# Patient Record
Sex: Female | Born: 1940
Health system: Southern US, Community
[De-identification: ages and names within clinical notes are randomized; demographics above are authoritative.]

## PROBLEM LIST (undated history)

## (undated) DIAGNOSIS — I10 Essential (primary) hypertension: Secondary | ICD-10-CM

## (undated) DIAGNOSIS — K219 Gastro-esophageal reflux disease without esophagitis: Secondary | ICD-10-CM

## (undated) DIAGNOSIS — R7303 Prediabetes: Secondary | ICD-10-CM

## (undated) DIAGNOSIS — I5032 Chronic diastolic (congestive) heart failure: Secondary | ICD-10-CM

## (undated) DIAGNOSIS — E785 Hyperlipidemia, unspecified: Secondary | ICD-10-CM

## (undated) DIAGNOSIS — I2699 Other pulmonary embolism without acute cor pulmonale: Secondary | ICD-10-CM

## (undated) DIAGNOSIS — I272 Pulmonary hypertension, unspecified: Secondary | ICD-10-CM

## (undated) DIAGNOSIS — U071 COVID-19: Secondary | ICD-10-CM

## (undated) DIAGNOSIS — I4891 Unspecified atrial fibrillation: Secondary | ICD-10-CM

## (undated) DIAGNOSIS — I2510 Atherosclerotic heart disease of native coronary artery without angina pectoris: Secondary | ICD-10-CM

## (undated) HISTORY — DX: Unspecified atrial fibrillation: I48.91

## (undated) HISTORY — PX: ABDOMINAL HYSTERECTOMY: SUR658

## (undated) HISTORY — DX: COVID-19: U07.1

## (undated) HISTORY — DX: Essential (primary) hypertension: I10

## (undated) HISTORY — DX: Gastro-esophageal reflux disease without esophagitis: K21.9

## (undated) HISTORY — DX: Prediabetes: R73.03

## (undated) HISTORY — DX: Other pulmonary embolism without acute cor pulmonale: I26.99

## (undated) HISTORY — DX: Hyperlipidemia, unspecified: E78.5

---

## 2011-08-09 DIAGNOSIS — J019 Acute sinusitis, unspecified: Secondary | ICD-10-CM | POA: Diagnosis not present

## 2011-10-17 DIAGNOSIS — K219 Gastro-esophageal reflux disease without esophagitis: Secondary | ICD-10-CM | POA: Diagnosis not present

## 2011-10-17 DIAGNOSIS — R609 Edema, unspecified: Secondary | ICD-10-CM | POA: Diagnosis not present

## 2011-10-17 DIAGNOSIS — I8310 Varicose veins of unspecified lower extremity with inflammation: Secondary | ICD-10-CM | POA: Diagnosis not present

## 2011-10-29 DIAGNOSIS — N3000 Acute cystitis without hematuria: Secondary | ICD-10-CM | POA: Diagnosis not present

## 2011-10-29 DIAGNOSIS — Z0000 Encounter for general adult medical examination without abnormal findings: Secondary | ICD-10-CM | POA: Diagnosis not present

## 2011-10-29 DIAGNOSIS — Z01419 Encounter for gynecological examination (general) (routine) without abnormal findings: Secondary | ICD-10-CM | POA: Diagnosis not present

## 2011-11-13 DIAGNOSIS — M949 Disorder of cartilage, unspecified: Secondary | ICD-10-CM | POA: Diagnosis not present

## 2011-11-13 DIAGNOSIS — N959 Unspecified menopausal and perimenopausal disorder: Secondary | ICD-10-CM | POA: Diagnosis not present

## 2011-11-13 DIAGNOSIS — Z1231 Encounter for screening mammogram for malignant neoplasm of breast: Secondary | ICD-10-CM | POA: Diagnosis not present

## 2011-11-13 DIAGNOSIS — M899 Disorder of bone, unspecified: Secondary | ICD-10-CM | POA: Diagnosis not present

## 2012-03-24 DIAGNOSIS — Z23 Encounter for immunization: Secondary | ICD-10-CM | POA: Diagnosis not present

## 2012-03-25 DIAGNOSIS — H43399 Other vitreous opacities, unspecified eye: Secondary | ICD-10-CM | POA: Diagnosis not present

## 2012-06-19 DIAGNOSIS — J209 Acute bronchitis, unspecified: Secondary | ICD-10-CM | POA: Diagnosis not present

## 2012-12-21 DIAGNOSIS — Z1231 Encounter for screening mammogram for malignant neoplasm of breast: Secondary | ICD-10-CM | POA: Diagnosis not present

## 2013-01-28 DIAGNOSIS — K219 Gastro-esophageal reflux disease without esophagitis: Secondary | ICD-10-CM | POA: Diagnosis not present

## 2013-01-28 DIAGNOSIS — R42 Dizziness and giddiness: Secondary | ICD-10-CM | POA: Diagnosis not present

## 2013-01-28 DIAGNOSIS — I10 Essential (primary) hypertension: Secondary | ICD-10-CM | POA: Diagnosis not present

## 2013-01-28 DIAGNOSIS — Z79899 Other long term (current) drug therapy: Secondary | ICD-10-CM | POA: Diagnosis not present

## 2013-01-29 DIAGNOSIS — R5383 Other fatigue: Secondary | ICD-10-CM | POA: Diagnosis not present

## 2013-01-29 DIAGNOSIS — R5381 Other malaise: Secondary | ICD-10-CM | POA: Diagnosis not present

## 2013-01-29 DIAGNOSIS — R030 Elevated blood-pressure reading, without diagnosis of hypertension: Secondary | ICD-10-CM | POA: Diagnosis not present

## 2013-02-03 DIAGNOSIS — R209 Unspecified disturbances of skin sensation: Secondary | ICD-10-CM | POA: Diagnosis not present

## 2013-02-03 DIAGNOSIS — K219 Gastro-esophageal reflux disease without esophagitis: Secondary | ICD-10-CM | POA: Diagnosis not present

## 2013-02-03 DIAGNOSIS — I059 Rheumatic mitral valve disease, unspecified: Secondary | ICD-10-CM | POA: Diagnosis not present

## 2013-02-03 DIAGNOSIS — I495 Sick sinus syndrome: Secondary | ICD-10-CM | POA: Diagnosis not present

## 2013-02-03 DIAGNOSIS — R6889 Other general symptoms and signs: Secondary | ICD-10-CM | POA: Diagnosis not present

## 2013-02-03 DIAGNOSIS — Z79899 Other long term (current) drug therapy: Secondary | ICD-10-CM | POA: Diagnosis not present

## 2013-02-03 DIAGNOSIS — G459 Transient cerebral ischemic attack, unspecified: Secondary | ICD-10-CM | POA: Diagnosis not present

## 2013-02-03 DIAGNOSIS — J322 Chronic ethmoidal sinusitis: Secondary | ICD-10-CM | POA: Diagnosis not present

## 2013-02-03 DIAGNOSIS — I10 Essential (primary) hypertension: Secondary | ICD-10-CM | POA: Diagnosis not present

## 2013-02-03 DIAGNOSIS — R5381 Other malaise: Secondary | ICD-10-CM | POA: Diagnosis not present

## 2013-02-03 DIAGNOSIS — G8190 Hemiplegia, unspecified affecting unspecified side: Secondary | ICD-10-CM | POA: Diagnosis not present

## 2013-02-12 DIAGNOSIS — I10 Essential (primary) hypertension: Secondary | ICD-10-CM | POA: Diagnosis not present

## 2013-02-12 DIAGNOSIS — G459 Transient cerebral ischemic attack, unspecified: Secondary | ICD-10-CM | POA: Diagnosis not present

## 2013-07-26 DIAGNOSIS — M169 Osteoarthritis of hip, unspecified: Secondary | ICD-10-CM | POA: Diagnosis not present

## 2013-07-26 DIAGNOSIS — M47817 Spondylosis without myelopathy or radiculopathy, lumbosacral region: Secondary | ICD-10-CM | POA: Diagnosis not present

## 2013-07-26 DIAGNOSIS — M79609 Pain in unspecified limb: Secondary | ICD-10-CM | POA: Diagnosis not present

## 2013-07-26 DIAGNOSIS — M1610 Unilateral primary osteoarthritis, unspecified hip: Secondary | ICD-10-CM | POA: Diagnosis not present

## 2013-07-29 DIAGNOSIS — R609 Edema, unspecified: Secondary | ICD-10-CM | POA: Diagnosis not present

## 2013-07-29 DIAGNOSIS — K219 Gastro-esophageal reflux disease without esophagitis: Secondary | ICD-10-CM | POA: Diagnosis not present

## 2013-07-29 DIAGNOSIS — E559 Vitamin D deficiency, unspecified: Secondary | ICD-10-CM | POA: Diagnosis not present

## 2013-07-29 DIAGNOSIS — E782 Mixed hyperlipidemia: Secondary | ICD-10-CM | POA: Diagnosis not present

## 2013-07-29 DIAGNOSIS — I10 Essential (primary) hypertension: Secondary | ICD-10-CM | POA: Diagnosis not present

## 2013-07-29 DIAGNOSIS — E7800 Pure hypercholesterolemia, unspecified: Secondary | ICD-10-CM | POA: Diagnosis not present

## 2013-09-06 DIAGNOSIS — H6980 Other specified disorders of Eustachian tube, unspecified ear: Secondary | ICD-10-CM | POA: Diagnosis not present

## 2013-09-06 DIAGNOSIS — R42 Dizziness and giddiness: Secondary | ICD-10-CM | POA: Diagnosis not present

## 2013-11-05 DIAGNOSIS — Z86010 Personal history of colonic polyps: Secondary | ICD-10-CM | POA: Diagnosis not present

## 2013-11-05 DIAGNOSIS — D126 Benign neoplasm of colon, unspecified: Secondary | ICD-10-CM | POA: Diagnosis not present

## 2013-11-05 DIAGNOSIS — K5730 Diverticulosis of large intestine without perforation or abscess without bleeding: Secondary | ICD-10-CM | POA: Diagnosis not present

## 2013-11-05 HISTORY — PX: COLONOSCOPY: SHX174

## 2014-02-03 DIAGNOSIS — E782 Mixed hyperlipidemia: Secondary | ICD-10-CM | POA: Diagnosis not present

## 2014-02-03 DIAGNOSIS — E7800 Pure hypercholesterolemia, unspecified: Secondary | ICD-10-CM | POA: Diagnosis not present

## 2014-02-03 DIAGNOSIS — E559 Vitamin D deficiency, unspecified: Secondary | ICD-10-CM | POA: Diagnosis not present

## 2014-02-03 DIAGNOSIS — I10 Essential (primary) hypertension: Secondary | ICD-10-CM | POA: Diagnosis not present

## 2014-02-03 DIAGNOSIS — K219 Gastro-esophageal reflux disease without esophagitis: Secondary | ICD-10-CM | POA: Diagnosis not present

## 2014-03-24 HISTORY — PX: CATARACT EXTRACTION: SUR2

## 2014-03-25 DIAGNOSIS — H25812 Combined forms of age-related cataract, left eye: Secondary | ICD-10-CM | POA: Diagnosis not present

## 2014-04-19 DIAGNOSIS — K219 Gastro-esophageal reflux disease without esophagitis: Secondary | ICD-10-CM | POA: Diagnosis not present

## 2014-04-19 DIAGNOSIS — I10 Essential (primary) hypertension: Secondary | ICD-10-CM | POA: Diagnosis not present

## 2014-04-19 DIAGNOSIS — Z8673 Personal history of transient ischemic attack (TIA), and cerebral infarction without residual deficits: Secondary | ICD-10-CM | POA: Diagnosis not present

## 2014-04-19 DIAGNOSIS — Z79899 Other long term (current) drug therapy: Secondary | ICD-10-CM | POA: Diagnosis not present

## 2014-04-19 DIAGNOSIS — H269 Unspecified cataract: Secondary | ICD-10-CM | POA: Diagnosis not present

## 2014-04-19 DIAGNOSIS — H25812 Combined forms of age-related cataract, left eye: Secondary | ICD-10-CM | POA: Diagnosis not present

## 2014-04-19 DIAGNOSIS — Z7982 Long term (current) use of aspirin: Secondary | ICD-10-CM | POA: Diagnosis not present

## 2014-04-22 DIAGNOSIS — Z23 Encounter for immunization: Secondary | ICD-10-CM | POA: Diagnosis not present

## 2014-05-24 HISTORY — PX: CATARACT EXTRACTION: SUR2

## 2014-05-31 DIAGNOSIS — E785 Hyperlipidemia, unspecified: Secondary | ICD-10-CM | POA: Diagnosis not present

## 2014-05-31 DIAGNOSIS — H269 Unspecified cataract: Secondary | ICD-10-CM | POA: Diagnosis not present

## 2014-05-31 DIAGNOSIS — E669 Obesity, unspecified: Secondary | ICD-10-CM | POA: Diagnosis not present

## 2014-05-31 DIAGNOSIS — K219 Gastro-esophageal reflux disease without esophagitis: Secondary | ICD-10-CM | POA: Diagnosis not present

## 2014-05-31 DIAGNOSIS — Z8673 Personal history of transient ischemic attack (TIA), and cerebral infarction without residual deficits: Secondary | ICD-10-CM | POA: Diagnosis not present

## 2014-05-31 DIAGNOSIS — I10 Essential (primary) hypertension: Secondary | ICD-10-CM | POA: Diagnosis not present

## 2014-05-31 DIAGNOSIS — H25811 Combined forms of age-related cataract, right eye: Secondary | ICD-10-CM | POA: Diagnosis not present

## 2014-05-31 DIAGNOSIS — Z79899 Other long term (current) drug therapy: Secondary | ICD-10-CM | POA: Diagnosis not present

## 2014-05-31 DIAGNOSIS — Z7982 Long term (current) use of aspirin: Secondary | ICD-10-CM | POA: Diagnosis not present

## 2014-06-30 DIAGNOSIS — H524 Presbyopia: Secondary | ICD-10-CM | POA: Diagnosis not present

## 2014-09-05 DIAGNOSIS — E780 Pure hypercholesterolemia: Secondary | ICD-10-CM | POA: Diagnosis not present

## 2014-09-05 DIAGNOSIS — I10 Essential (primary) hypertension: Secondary | ICD-10-CM | POA: Diagnosis not present

## 2014-09-05 DIAGNOSIS — R600 Localized edema: Secondary | ICD-10-CM | POA: Diagnosis not present

## 2014-09-09 DIAGNOSIS — R600 Localized edema: Secondary | ICD-10-CM | POA: Diagnosis not present

## 2014-09-09 DIAGNOSIS — R0600 Dyspnea, unspecified: Secondary | ICD-10-CM | POA: Diagnosis not present

## 2014-11-09 DIAGNOSIS — J0100 Acute maxillary sinusitis, unspecified: Secondary | ICD-10-CM | POA: Diagnosis not present

## 2014-12-15 DIAGNOSIS — K219 Gastro-esophageal reflux disease without esophagitis: Secondary | ICD-10-CM | POA: Diagnosis not present

## 2014-12-15 DIAGNOSIS — I10 Essential (primary) hypertension: Secondary | ICD-10-CM | POA: Diagnosis not present

## 2014-12-15 DIAGNOSIS — E782 Mixed hyperlipidemia: Secondary | ICD-10-CM | POA: Diagnosis not present

## 2014-12-19 DIAGNOSIS — H26493 Other secondary cataract, bilateral: Secondary | ICD-10-CM | POA: Diagnosis not present

## 2014-12-22 DIAGNOSIS — Z1231 Encounter for screening mammogram for malignant neoplasm of breast: Secondary | ICD-10-CM | POA: Diagnosis not present

## 2015-03-24 DIAGNOSIS — E782 Mixed hyperlipidemia: Secondary | ICD-10-CM | POA: Diagnosis not present

## 2015-03-24 DIAGNOSIS — Z23 Encounter for immunization: Secondary | ICD-10-CM | POA: Diagnosis not present

## 2015-03-24 DIAGNOSIS — R7301 Impaired fasting glucose: Secondary | ICD-10-CM | POA: Diagnosis not present

## 2015-03-24 DIAGNOSIS — I10 Essential (primary) hypertension: Secondary | ICD-10-CM | POA: Diagnosis not present

## 2015-03-24 DIAGNOSIS — K219 Gastro-esophageal reflux disease without esophagitis: Secondary | ICD-10-CM | POA: Diagnosis not present

## 2015-06-22 DIAGNOSIS — K219 Gastro-esophageal reflux disease without esophagitis: Secondary | ICD-10-CM | POA: Diagnosis not present

## 2015-06-22 DIAGNOSIS — R21 Rash and other nonspecific skin eruption: Secondary | ICD-10-CM | POA: Diagnosis not present

## 2015-06-22 DIAGNOSIS — R7309 Other abnormal glucose: Secondary | ICD-10-CM | POA: Diagnosis not present

## 2015-06-22 DIAGNOSIS — R7301 Impaired fasting glucose: Secondary | ICD-10-CM | POA: Diagnosis not present

## 2015-06-22 DIAGNOSIS — E782 Mixed hyperlipidemia: Secondary | ICD-10-CM | POA: Diagnosis not present

## 2015-06-22 DIAGNOSIS — I10 Essential (primary) hypertension: Secondary | ICD-10-CM | POA: Diagnosis not present

## 2015-06-29 DIAGNOSIS — H26493 Other secondary cataract, bilateral: Secondary | ICD-10-CM | POA: Diagnosis not present

## 2015-08-31 DIAGNOSIS — K219 Gastro-esophageal reflux disease without esophagitis: Secondary | ICD-10-CM | POA: Diagnosis not present

## 2015-08-31 DIAGNOSIS — E782 Mixed hyperlipidemia: Secondary | ICD-10-CM | POA: Diagnosis not present

## 2015-08-31 DIAGNOSIS — R7301 Impaired fasting glucose: Secondary | ICD-10-CM | POA: Diagnosis not present

## 2015-08-31 DIAGNOSIS — I10 Essential (primary) hypertension: Secondary | ICD-10-CM | POA: Diagnosis not present

## 2015-11-10 DIAGNOSIS — J018 Other acute sinusitis: Secondary | ICD-10-CM | POA: Diagnosis not present

## 2015-12-01 DIAGNOSIS — I10 Essential (primary) hypertension: Secondary | ICD-10-CM | POA: Diagnosis not present

## 2015-12-01 DIAGNOSIS — R7301 Impaired fasting glucose: Secondary | ICD-10-CM | POA: Diagnosis not present

## 2015-12-01 DIAGNOSIS — K219 Gastro-esophageal reflux disease without esophagitis: Secondary | ICD-10-CM | POA: Diagnosis not present

## 2015-12-01 DIAGNOSIS — E782 Mixed hyperlipidemia: Secondary | ICD-10-CM | POA: Diagnosis not present

## 2015-12-01 DIAGNOSIS — J301 Allergic rhinitis due to pollen: Secondary | ICD-10-CM | POA: Diagnosis not present

## 2015-12-25 DIAGNOSIS — Z1231 Encounter for screening mammogram for malignant neoplasm of breast: Secondary | ICD-10-CM | POA: Diagnosis not present

## 2016-02-07 DIAGNOSIS — I8310 Varicose veins of unspecified lower extremity with inflammation: Secondary | ICD-10-CM | POA: Diagnosis not present

## 2016-02-07 DIAGNOSIS — R600 Localized edema: Secondary | ICD-10-CM | POA: Diagnosis not present

## 2016-03-05 DIAGNOSIS — I10 Essential (primary) hypertension: Secondary | ICD-10-CM | POA: Diagnosis not present

## 2016-03-05 DIAGNOSIS — R7301 Impaired fasting glucose: Secondary | ICD-10-CM | POA: Diagnosis not present

## 2016-03-05 DIAGNOSIS — E782 Mixed hyperlipidemia: Secondary | ICD-10-CM | POA: Diagnosis not present

## 2016-03-05 DIAGNOSIS — Z23 Encounter for immunization: Secondary | ICD-10-CM | POA: Diagnosis not present

## 2016-03-05 DIAGNOSIS — K219 Gastro-esophageal reflux disease without esophagitis: Secondary | ICD-10-CM | POA: Diagnosis not present

## 2016-03-11 DIAGNOSIS — I8310 Varicose veins of unspecified lower extremity with inflammation: Secondary | ICD-10-CM | POA: Diagnosis not present

## 2016-06-05 DIAGNOSIS — R7301 Impaired fasting glucose: Secondary | ICD-10-CM | POA: Diagnosis not present

## 2016-06-05 DIAGNOSIS — K219 Gastro-esophageal reflux disease without esophagitis: Secondary | ICD-10-CM | POA: Diagnosis not present

## 2016-06-05 DIAGNOSIS — I10 Essential (primary) hypertension: Secondary | ICD-10-CM | POA: Diagnosis not present

## 2016-06-05 DIAGNOSIS — E782 Mixed hyperlipidemia: Secondary | ICD-10-CM | POA: Diagnosis not present

## 2016-06-29 DIAGNOSIS — J988 Other specified respiratory disorders: Secondary | ICD-10-CM | POA: Diagnosis not present

## 2016-06-29 DIAGNOSIS — J329 Chronic sinusitis, unspecified: Secondary | ICD-10-CM | POA: Diagnosis not present

## 2016-09-06 DIAGNOSIS — R7301 Impaired fasting glucose: Secondary | ICD-10-CM | POA: Diagnosis not present

## 2016-09-06 DIAGNOSIS — M25562 Pain in left knee: Secondary | ICD-10-CM | POA: Diagnosis not present

## 2016-09-06 DIAGNOSIS — K219 Gastro-esophageal reflux disease without esophagitis: Secondary | ICD-10-CM | POA: Diagnosis not present

## 2016-09-06 DIAGNOSIS — E782 Mixed hyperlipidemia: Secondary | ICD-10-CM | POA: Diagnosis not present

## 2016-09-06 DIAGNOSIS — I10 Essential (primary) hypertension: Secondary | ICD-10-CM | POA: Diagnosis not present

## 2016-09-16 DIAGNOSIS — M1712 Unilateral primary osteoarthritis, left knee: Secondary | ICD-10-CM | POA: Diagnosis not present

## 2016-12-09 DIAGNOSIS — E782 Mixed hyperlipidemia: Secondary | ICD-10-CM | POA: Diagnosis not present

## 2016-12-09 DIAGNOSIS — Z1231 Encounter for screening mammogram for malignant neoplasm of breast: Secondary | ICD-10-CM | POA: Diagnosis not present

## 2016-12-09 DIAGNOSIS — K219 Gastro-esophageal reflux disease without esophagitis: Secondary | ICD-10-CM | POA: Diagnosis not present

## 2016-12-09 DIAGNOSIS — R7301 Impaired fasting glucose: Secondary | ICD-10-CM | POA: Diagnosis not present

## 2016-12-09 DIAGNOSIS — I10 Essential (primary) hypertension: Secondary | ICD-10-CM | POA: Diagnosis not present

## 2016-12-26 DIAGNOSIS — Z1231 Encounter for screening mammogram for malignant neoplasm of breast: Secondary | ICD-10-CM | POA: Diagnosis not present

## 2017-03-13 DIAGNOSIS — K219 Gastro-esophageal reflux disease without esophagitis: Secondary | ICD-10-CM | POA: Diagnosis not present

## 2017-03-13 DIAGNOSIS — I10 Essential (primary) hypertension: Secondary | ICD-10-CM | POA: Diagnosis not present

## 2017-03-13 DIAGNOSIS — R7301 Impaired fasting glucose: Secondary | ICD-10-CM | POA: Diagnosis not present

## 2017-03-13 DIAGNOSIS — E782 Mixed hyperlipidemia: Secondary | ICD-10-CM | POA: Diagnosis not present

## 2017-03-13 DIAGNOSIS — Z23 Encounter for immunization: Secondary | ICD-10-CM | POA: Diagnosis not present

## 2017-06-13 DIAGNOSIS — E782 Mixed hyperlipidemia: Secondary | ICD-10-CM | POA: Diagnosis not present

## 2017-06-13 DIAGNOSIS — I10 Essential (primary) hypertension: Secondary | ICD-10-CM | POA: Diagnosis not present

## 2017-06-13 DIAGNOSIS — R7301 Impaired fasting glucose: Secondary | ICD-10-CM | POA: Diagnosis not present

## 2017-06-13 DIAGNOSIS — K219 Gastro-esophageal reflux disease without esophagitis: Secondary | ICD-10-CM | POA: Diagnosis not present

## 2017-09-12 DIAGNOSIS — K219 Gastro-esophageal reflux disease without esophagitis: Secondary | ICD-10-CM | POA: Diagnosis not present

## 2017-09-12 DIAGNOSIS — Z1231 Encounter for screening mammogram for malignant neoplasm of breast: Secondary | ICD-10-CM | POA: Diagnosis not present

## 2017-09-12 DIAGNOSIS — I10 Essential (primary) hypertension: Secondary | ICD-10-CM | POA: Diagnosis not present

## 2017-09-12 DIAGNOSIS — M8589 Other specified disorders of bone density and structure, multiple sites: Secondary | ICD-10-CM | POA: Diagnosis not present

## 2017-09-12 DIAGNOSIS — R7301 Impaired fasting glucose: Secondary | ICD-10-CM | POA: Diagnosis not present

## 2017-09-12 DIAGNOSIS — E782 Mixed hyperlipidemia: Secondary | ICD-10-CM | POA: Diagnosis not present

## 2017-10-02 DIAGNOSIS — M79661 Pain in right lower leg: Secondary | ICD-10-CM | POA: Diagnosis not present

## 2017-10-02 DIAGNOSIS — M79662 Pain in left lower leg: Secondary | ICD-10-CM | POA: Diagnosis not present

## 2017-10-02 DIAGNOSIS — I878 Other specified disorders of veins: Secondary | ICD-10-CM | POA: Diagnosis not present

## 2017-10-02 DIAGNOSIS — Z7689 Persons encountering health services in other specified circumstances: Secondary | ICD-10-CM | POA: Diagnosis not present

## 2017-12-15 DIAGNOSIS — E782 Mixed hyperlipidemia: Secondary | ICD-10-CM | POA: Diagnosis not present

## 2017-12-15 DIAGNOSIS — I10 Essential (primary) hypertension: Secondary | ICD-10-CM | POA: Diagnosis not present

## 2017-12-15 DIAGNOSIS — K219 Gastro-esophageal reflux disease without esophagitis: Secondary | ICD-10-CM | POA: Diagnosis not present

## 2017-12-15 DIAGNOSIS — R7301 Impaired fasting glucose: Secondary | ICD-10-CM | POA: Diagnosis not present

## 2017-12-15 DIAGNOSIS — R600 Localized edema: Secondary | ICD-10-CM | POA: Diagnosis not present

## 2017-12-29 DIAGNOSIS — M8589 Other specified disorders of bone density and structure, multiple sites: Secondary | ICD-10-CM | POA: Diagnosis not present

## 2017-12-29 DIAGNOSIS — I10 Essential (primary) hypertension: Secondary | ICD-10-CM | POA: Diagnosis not present

## 2017-12-29 DIAGNOSIS — R600 Localized edema: Secondary | ICD-10-CM | POA: Diagnosis not present

## 2017-12-29 DIAGNOSIS — Z1231 Encounter for screening mammogram for malignant neoplasm of breast: Secondary | ICD-10-CM | POA: Diagnosis not present

## 2017-12-29 DIAGNOSIS — R2243 Localized swelling, mass and lump, lower limb, bilateral: Secondary | ICD-10-CM | POA: Diagnosis not present

## 2018-01-05 DIAGNOSIS — R600 Localized edema: Secondary | ICD-10-CM | POA: Diagnosis not present

## 2018-01-14 DIAGNOSIS — I517 Cardiomegaly: Secondary | ICD-10-CM | POA: Diagnosis not present

## 2018-01-14 DIAGNOSIS — R600 Localized edema: Secondary | ICD-10-CM | POA: Diagnosis not present

## 2018-01-14 DIAGNOSIS — I10 Essential (primary) hypertension: Secondary | ICD-10-CM | POA: Diagnosis not present

## 2018-01-30 ENCOUNTER — Encounter: Payer: Self-pay | Admitting: Cardiology

## 2018-01-30 DIAGNOSIS — Z8249 Family history of ischemic heart disease and other diseases of the circulatory system: Secondary | ICD-10-CM

## 2018-01-30 DIAGNOSIS — I110 Hypertensive heart disease with heart failure: Secondary | ICD-10-CM | POA: Insufficient documentation

## 2018-01-30 DIAGNOSIS — K219 Gastro-esophageal reflux disease without esophagitis: Secondary | ICD-10-CM

## 2018-01-30 HISTORY — DX: Gastro-esophageal reflux disease without esophagitis: K21.9

## 2018-01-30 HISTORY — DX: Hypertensive heart disease with heart failure: I11.0

## 2018-01-30 HISTORY — DX: Family history of ischemic heart disease and other diseases of the circulatory system: Z82.49

## 2018-02-18 ENCOUNTER — Ambulatory Visit (INDEPENDENT_AMBULATORY_CARE_PROVIDER_SITE_OTHER): Payer: Medicare Other | Admitting: Cardiology

## 2018-02-18 ENCOUNTER — Encounter: Payer: Self-pay | Admitting: Cardiology

## 2018-02-18 VITALS — BP 132/76 | HR 56 | Ht 65.0 in | Wt 248.0 lb

## 2018-02-18 DIAGNOSIS — E782 Mixed hyperlipidemia: Secondary | ICD-10-CM

## 2018-02-18 DIAGNOSIS — R079 Chest pain, unspecified: Secondary | ICD-10-CM | POA: Diagnosis not present

## 2018-02-18 DIAGNOSIS — I10 Essential (primary) hypertension: Secondary | ICD-10-CM | POA: Diagnosis not present

## 2018-02-18 DIAGNOSIS — I209 Angina pectoris, unspecified: Secondary | ICD-10-CM

## 2018-02-18 HISTORY — DX: Angina pectoris, unspecified: I20.9

## 2018-02-18 HISTORY — DX: Mixed hyperlipidemia: E78.2

## 2018-02-18 NOTE — Patient Instructions (Addendum)
Medication Instructions:  Your physician recommends that you continue on your current medications as directed. Please refer to the Current Medication list given to you today.  Labwork: Your physician recommends that you have the following labs drawn: BMP, TSH, liver and lipid panel.  Testing/Procedures: Your physician has requested that you have a lexiscan myoview. For further information please visit [email or web address]. Please follow instruction sheet, as given.  Follow-Up: Your physician recommends that you schedule a follow-up appointment in: 4 months  Any Other Special Instructions Will Be Listed Below (If Applicable).     If you need a refill on your cardiac medications before your next appointment, please call your pharmacy.   [place], RN, BSN

## 2018-02-18 NOTE — Progress Notes (Signed)
Cardiology Office Note:    Date:  02/18/2018   ID:  Miranda Moses, DOB August 24, 1940, MRN [id number]  PCP:  [name], PA-C  Cardiologist:  Jenean Lindau, MD   Referring MD: [name], PA-C    ASSESSMENT:    1. Chest pain, unspecified type   2. Essential hypertension   3. Mixed dyslipidemia    PLAN:    In order of problems listed above:   Primary prevention stressed with the patient.  Importance of compliance with diet and medication stressed and she vocalized understanding. Her blood pressure is stable.  Diet was discussed with dyslipidemia and obesity.  I cautioned her about being overweight and its risks.  I will do blood work including lipids when she comes back for a stress test. Patient has multiple risk factors for coronary artery disease and therefore in view of her symptoms we will do nuclear stress test.  We will try to make her walk on the treadmill but it may be a challenge for her so we will put her for a Lexiscan sestamibi.  Medication Adjustments/Labs and Tests Ordered: Current medicines are reviewed at length with the patient today.  Concerns regarding medicines are outlined above.  No orders of the defined types were placed in this encounter.  No orders of the defined types were placed in this encounter.    History of Present Illness:    Miranda Moses is a 77 y.o. female who is being seen today for the evaluation of chest pain at the request of [name], [place].  Patient is a pleasant 77 year old female.  She has past medical history of essential hypertension and dyslipidemia.  She has been told that she is a prediabetic.  She is overweight.  She mentions to me that she has some chest tightness on exertion.  This does not necessarily related to exertion all the time.  No orthopnea or PND.  No radiation of the symptoms to any part of the body.  At the time of my evaluation, the patient is alert awake oriented and in no distress.  Past Medical History:    Diagnosis Date  . GERD (gastroesophageal reflux disease)   . Hyperlipidemia   . Hypertension   . Pre-diabetes     Past Surgical History:  Procedure Laterality Date  . ABDOMINAL HYSTERECTOMY    . CATARACT EXTRACTION Left 03/2014  . CATARACT EXTRACTION Right 05/2014  . COLONOSCOPY  11/05/2013    Current Medications: Current Meds  Medication Sig  . aspirin EC 81 MG tablet Take 81 mg by mouth daily.  Marland Kitchen atorvastatin (LIPITOR) 10 MG tablet Take 10 mg by mouth daily.  . bisoprolol (ZEBETA) 5 MG tablet Take 2.5 mg by mouth daily.  . furosemide (LASIX) 20 MG tablet Take 20 mg by mouth as needed.   Marland Kitchen omeprazole (PRILOSEC) 20 MG capsule Take 20 mg by mouth daily.      Allergies:   Clarithromycin; Codeine; and Penicillins   Social History   Socioeconomic History  . Marital status: Married    Spouse name: Not on file  . Number of children: 1  . Years of education: Not on file  . Highest education level: Not on file  Occupational History  . Not on file  Social Needs  . Financial resource strain: Not on file  . Food insecurity:    Worry: Not on file    Inability: Not on file  . Transportation needs:    Medical: Not on file  Non-medical: Not on file  Tobacco Use  . Smoking status: Never Smoker  . Smokeless tobacco: Never Used  Substance and Sexual Activity  . Alcohol use: Not on file  . Drug use: Not on file  . Sexual activity: Not on file  Lifestyle  . Physical activity:    Days per week: Not on file    Minutes per session: Not on file  . Stress: Not on file  Relationships  . Social connections:    Talks on phone: Not on file    Gets together: Not on file    Attends religious service: Not on file    Active member of club or organization: Not on file    Attends meetings of clubs or organizations: Not on file    Relationship status: Not on file  Other Topics Concern  . Not on file  Social History Narrative  . Not on file     Family History: The patient's  family history is not on file.  ROS:   Please see the history of present illness.    All other systems reviewed and are negative.  EKGs/Labs/Other Studies Reviewed:    The following studies were reviewed today: I reviewed [facility] hospital records extensively. Echocardiogram done at [facility] on 724 revealed ejection fraction of 50 to 55%.  Abnormal diastolic function mild mitral regurgitation.  Pulmonary artery systolic pressure was 65 mmHg.   Recent Labs: No results found for requested labs within last 8760 hours.  Recent Lipid Panel No results found for: CHOL, TRIG, HDL, CHOLHDL, VLDL, LDLCALC, LDLDIRECT  Physical Exam:    VS:  BP 132/76 (BP Location: Right Arm, Patient Position: Sitting, Cuff Size: Normal)   Pulse (!) 56   Ht 5\' 5"  (1.651 m)   Wt 248 lb (112.5 kg)   SpO2 97%   BMI 41.27 kg/m     Wt Readings from Last 3 Encounters:  02/18/18 248 lb (112.5 kg)     GEN: Patient is in no acute distress HEENT: Normal NECK: No JVD; No carotid bruits LYMPHATICS: No lymphadenopathy CARDIAC: S1 S2 regular, 2/6 systolic murmur at the apex. RESPIRATORY:  Clear to auscultation without rales, wheezing or rhonchi  ABDOMEN: Soft, non-tender, non-distended MUSCULOSKELETAL:  No edema; No deformity  SKIN: Warm and dry NEUROLOGIC:  Alert and oriented x 3 PSYCHIATRIC:  Normal affect    Signed, Jenean Lindau, MD  02/18/2018 9:22 AM     [facility]

## 2018-03-05 ENCOUNTER — Telehealth (HOSPITAL_COMMUNITY): Payer: Self-pay | Admitting: *Deleted

## 2018-03-05 NOTE — Telephone Encounter (Signed)
Attempted to call patient regarding upcoming appointment- no answer.  Miranda Moses Jacqueline  

## 2018-03-11 ENCOUNTER — Ambulatory Visit: Payer: Medicare Other

## 2018-05-12 ENCOUNTER — Other Ambulatory Visit: Payer: Self-pay

## 2018-06-29 DIAGNOSIS — J060 Acute laryngopharyngitis: Secondary | ICD-10-CM | POA: Diagnosis not present

## 2018-08-27 NOTE — Progress Notes (Signed)
Cardiology Office Note:    Date:  08/28/2018   ID:  [name], DOB 06-May-1941, MRN [id number]  PCP:  [name], PA-C  Cardiologist:  [name], MD    Referring MD: [name], PA-C    ASSESSMENT:    1. Angina pectoris ([place])   2. Essential hypertension   3. PAH (pulmonary artery hypertension) ([place])   4. PAD (peripheral artery disease) ([facility])    PLAN:    In order of problems listed above:  1. Further evaluation cardiac CTA continue current treatment beta-blocker aspirin of his obstructive CAD will require lipid-lowering therapy with a statin perhaps combined treatment 2. Stable continue current treatment 3. Check proBNP level await CTA regarding pulmonary artery enlargement 4. Attempt access records from [facility] sounds like she has had noninvasive vascular studies she is not having claudication and I do not think she has severe peripheral arterial disease   Next appointment: 6 weeks   Medication Adjustments/Labs and Tests Ordered: Current medicines are reviewed at length with the patient today.  Concerns regarding medicines are outlined above.  Orders Placed This Encounter  Procedures  . CT CORONARY MORPH W/CTA COR W/SCORE W/CA W/CM &/OR WO/CM  . CT CORONARY FRACTIONAL FLOW RESERVE DATA PREP  . CT CORONARY FRACTIONAL FLOW RESERVE FLUID ANALYSIS   No orders of the defined types were placed in this encounter.   Chief Complaint  Patient presents with  . Follow-up    History of Present Illness:    Miranda Moses is a 78 y.o. female with a hx of chest pain hypertension dyslipidemia last seen by Dr. Geraldo Pitter 02/18/2018.  A stress Myoview study was ordered but not performed. Compliance with diet, lifestyle and medications: Yes  I recognize her is seen her husband was hospitalized with poor with multiple medical illnesses since then he died and she is warned and he reports that she did not have follow-up follow-up with cardiology testing.  She has been  seen in the past for exertional chest pain sounds like typical angina advised myocardial perfusion study she also has shortness of breath edema and echocardiogram that imply pulmonary artery hypertension in 2019.  She is told she has peripheral arterial disease but does not have claudication she has CT of the abdomen and pelvis that showed atherosclerosis but no stenosis and then tells me that she had vascular studies done at [facility] and inquired of the results.  We will see if we can access them.  For evaluation of exertional chest pain or shortness of breath I think she is best served with cardiac CTA will recheck renal function and proBNP level today.  I think it is almost incomprehensible that she truly has pulmonary artery hypertension unless her BNP is elevated and the pulmonary artery is distended on the CT scan.  I will see back in the office in 6 weeks to follow-up recent labs show cholesterol 186 HDL 51 LDL 114 and if she has significant CAD would require intensification of lipid-lowering therapy.  She has edema but no orthopnea palpitation or syncope and she is short of breath when she walks and from work rushes or walks uphill.  The pattern is stable.  She had an echocardiogram at [facility] 01/14/2018 showing ejection fraction of 50 to 55% severe left atrial enlargement and pulmonary hypertension with a peak pulmonary artery systolic pressure of 65 mmHg Past Medical History:  Diagnosis Date  . GERD (gastroesophageal reflux disease)   . Hyperlipidemia   . Hypertension   .  Pre-diabetes     Past Surgical History:  Procedure Laterality Date  . ABDOMINAL HYSTERECTOMY    . CATARACT EXTRACTION Left 03/2014  . CATARACT EXTRACTION Right 05/2014  . COLONOSCOPY  11/05/2013    Current Medications: Current Meds  Medication Sig  . aspirin EC 81 MG tablet Take 81 mg by mouth daily.  Marland Kitchen atorvastatin (LIPITOR) 10 MG tablet Take 10 mg by mouth daily.  . bisoprolol (ZEBETA) 5 MG  tablet Take 2.5 mg by mouth daily.  . furosemide (LASIX) 20 MG tablet Take 20 mg by mouth as needed.   Marland Kitchen omeprazole (PRILOSEC) 20 MG capsule Take 20 mg by mouth daily.   Marland Kitchen triamcinolone cream (KENALOG) 0.5 % Apply 1 application topically daily as needed.     Allergies:   Clarithromycin; Codeine; and Penicillins   Social History   Socioeconomic History  . Marital status: Married    Spouse name: Not on file  . Number of children: 1  . Years of education: Not on file  . Highest education level: Not on file  Occupational History  . Not on file  Social Needs  . Financial resource strain: Not on file  . Food insecurity:    Worry: Not on file    Inability: Not on file  . Transportation needs:    Medical: Not on file    Non-medical: Not on file  Tobacco Use  . Smoking status: Never Smoker  . Smokeless tobacco: Never Used  Substance and Sexual Activity  . Alcohol use: Never    Frequency: Never  . Drug use: Never  . Sexual activity: Not on file  Lifestyle  . Physical activity:    Days per week: Not on file    Minutes per session: Not on file  . Stress: Not on file  Relationships  . Social connections:    Talks on phone: Not on file    Gets together: Not on file    Attends religious service: Not on file    Active member of club or organization: Not on file    Attends meetings of clubs or organizations: Not on file    Relationship status: Not on file  Other Topics Concern  . Not on file  Social History Narrative  . Not on file     Family History: The patient's family history includes Heart disease in her father. ROS:   Please see the history of present illness.    All other systems reviewed and are negative.  EKGs/Labs/Other Studies Reviewed:    The following studies were reviewed today:  EKG:  EKG ordered today and personally reviewed.  The ekg ordered today demonstrates sinus rhythm no acute ischemic changes  Recent Labs: No results found for requested labs  within last 8760 hours.  Recent Lipid Panel No results found for: CHOL, TRIG, HDL, CHOLHDL, VLDL, LDLCALC, LDLDIRECT  Physical Exam:    VS:  BP 138/70   Pulse (!) 48   Ht 5\' 5"  (1.651 m)   Wt 234 lb 12.8 oz (106.5 kg)   SpO2 99%   BMI 39.07 kg/m     Wt Readings from Last 3 Encounters:  08/28/18 234 lb 12.8 oz (106.5 kg)  02/18/18 248 lb (112.5 kg)     GEN:  Well nourished, well developed in no acute distress HEENT: Normal NECK: No JVD; No carotid bruits LYMPHATICS: No lymphadenopathy CARDIAC: P2 is normal RRR, no murmurs, rubs, gallops RESPIRATORY:  Clear to auscultation without rales, wheezing or rhonchi  ABDOMEN:  Soft, non-tender, non-distended MUSCULOSKELETAL: 2+ bilateral lower extremity edema; No deformity  SKIN: Warm and dry NEUROLOGIC:  Alert and oriented x 3 PSYCHIATRIC:  Normal affect    Signed, [name], MD  08/28/2018 10:09 AM    [place]

## 2018-08-28 ENCOUNTER — Ambulatory Visit (INDEPENDENT_AMBULATORY_CARE_PROVIDER_SITE_OTHER): Payer: Medicare Other | Admitting: Cardiology

## 2018-08-28 ENCOUNTER — Encounter: Payer: Self-pay | Admitting: Cardiology

## 2018-08-28 ENCOUNTER — Other Ambulatory Visit: Payer: Self-pay | Admitting: Cardiology

## 2018-08-28 VITALS — BP 138/70 | HR 48 | Ht 65.0 in | Wt 234.8 lb

## 2018-08-28 DIAGNOSIS — I10 Essential (primary) hypertension: Secondary | ICD-10-CM | POA: Diagnosis not present

## 2018-08-28 DIAGNOSIS — I209 Angina pectoris, unspecified: Secondary | ICD-10-CM | POA: Diagnosis not present

## 2018-08-28 DIAGNOSIS — R0602 Shortness of breath: Secondary | ICD-10-CM

## 2018-08-28 DIAGNOSIS — I2721 Secondary pulmonary arterial hypertension: Secondary | ICD-10-CM | POA: Insufficient documentation

## 2018-08-28 DIAGNOSIS — I739 Peripheral vascular disease, unspecified: Secondary | ICD-10-CM | POA: Insufficient documentation

## 2018-08-28 HISTORY — DX: Secondary pulmonary arterial hypertension: I27.21

## 2018-08-28 HISTORY — DX: Peripheral vascular disease, unspecified: I73.9

## 2018-08-28 LAB — BASIC METABOLIC PANEL
BUN/Creatinine Ratio: 18 (ref 12–28)
BUN: 17 mg/dL (ref 8–27)
CO2: 25 mmol/L (ref 20–29)
Calcium: 9.8 mg/dL (ref 8.7–10.3)
Chloride: 100 mmol/L (ref 96–106)
Creatinine, Ser: 0.93 mg/dL (ref 0.57–1.00)
GFR calc Af Amer: 70 mL/min/{1.73_m2} (ref >59)
GFR calc non Af Amer: 60 mL/min/{1.73_m2} (ref >59)
Glucose: 101 mg/dL — ABNORMAL HIGH (ref 65–99)
Potassium: 4.4 mmol/L (ref 3.5–5.2)
Sodium: 140 mmol/L (ref 134–144)

## 2018-08-28 LAB — PRO B NATRIURETIC PEPTIDE: NT-Pro BNP: 154 pg/mL (ref 0–738)

## 2018-08-28 MED ORDER — METOPROLOL SUCCINATE ER 50 MG PO TB24: 50.0000 mg | ORAL_TABLET | Freq: Once | ORAL | 0 refills | Status: DC

## 2018-08-28 NOTE — Patient Instructions (Addendum)
Medication Instructions:   Your physician recommends that you continue on your current medications as directed. Please refer to the Current Medication list given to you today.  If you need a refill on your cardiac medications before your next appointment, please call your pharmacy.   Lab work: Your physician recommends that you return for lab work today: BMP and ProBNP  If you have labs (blood work) drawn today and your tests are completely normal, you will receive your results only by: Marland Kitchen MyChart Message (if you have MyChart) OR . A paper copy in the mail If you have any lab test that is abnormal or we need to change your treatment, we will call you to review the results.  Testing/Procedures: You had an EKG today  Please arrive at the [facility] main entrance of [facility] at xx:xx AM (30-45 minutes prior to test start time)  [facility] [place], [place] [address] [phone]  Proceed to the [facility] Radiology Department (First Floor).  Please follow these instructions carefully (unless otherwise directed):   On the Night Before the Test: . Be sure to Drink plenty of water. . Do not consume any caffeinated/decaffeinated beverages or chocolate 12 hours prior to your test. . Do not take any antihistamines 12 hours prior to your test.  On the Day of the Test: . Drink plenty of water. Do not drink any water within one hour of the test. . Do not eat any food 4 hours prior to the test. . You may take your regular medications prior to the test.  . Take metoprolol (Lopressor) prior to test. . HOLD Furosemide on the morning of the test.         -Take metoprolol (Lopressor) 2 hours prior to test (if applicable).                  -If HR is less than 55 BPM- No Beta Blocker                -IF HR is greater than 55 BPM and patient is less than or equal to 15 yrs old Lopressor 100mg  x1.                 After the Test: . Drink plenty of  water. . After receiving IV contrast, you may experience a mild flushed feeling. This is normal. . On occasion, you may experience a mild rash up to 24 hours after the test. This is not dangerous. If this occurs, you can take Benadryl 25 mg and increase your fluid intake. . If you experience trouble breathing, this can be serious. If it is severe call 911 IMMEDIATELY. If it is mild, please call our office.    Follow-Up: At [facility], you and your health needs are our priority.  As part of our continuing mission to provide you with exceptional heart care, we have created designated Provider Care Teams.  These Care Teams include your primary Cardiologist (physician) and Advanced Practice Providers (APPs -  Physician Assistants and Nurse Practitioners) who all work together to provide you with the care you need, when you need it. You will need a follow up appointment 4- 6 weeks.

## 2018-10-02 ENCOUNTER — Telehealth: Payer: Self-pay | Admitting: *Deleted

## 2018-10-02 NOTE — Telephone Encounter (Signed)
Lets do a virtual visit 

## 2018-10-02 NOTE — Telephone Encounter (Signed)
Pt was intended to have CT Coronary Fractional Flow test before visit on4/17. Do we need to do the virtual visit with her or put it off until testing can be done? Pt states she is feeling fine with no cardiac symptoms. Please advise.

## 2018-10-05 NOTE — Telephone Encounter (Signed)
Spoke with patient and informed her that Dr [name] would like to proceed with virtual visit on 10-09-18.  Patient gave verbal consent.      Cardiac Questionnaire:    Since your last visit or hospitalization:    1. Have you been having new or worsening chest pain? No   2. Have you been having new or worsening shortness of breath? No 3. Have you been having new or worsening leg swelling, wt gain, or increase in abdominal girth (pants fitting more tightly)? No   4. Have you had any passing out spells? No  _____________   [id number] Pre-Screening Questions:   Do you currently have a fever?No  Have you recently travelled on a cruise, internationally, or to [place], [place], [place], [place], [place], or [place], [place] [employer]) ? No  Have you been in contact with someone that is currently pending confirmation of Covid19 testing or has been confirmed to have the [place] virus? No  Are you currently experiencing fatigue or cough? No         YOUR CARDIOLOGY TEAM HAS ARRANGED FOR AN E-VISIT FOR YOUR APPOINTMENT - PLEASE REVIEW IMPORTANT INFORMATION BELOW SEVERAL DAYS PRIOR TO YOUR APPOINTMENT  Due to the recent COVID-19 pandemic, we are transitioning in-person office visits to tele-medicine visits in an effort to decrease unnecessary exposure to our patients and staff. Medicare and most insurances are covering these visits without a copay needed. We also encourage you to sign up for MyChart if you have not already done so. You will need a smartphone if possible. For patients that do not have this, we can still complete the visit using a regular telephone but do prefer a smartphone to enable video when possible. You may have a close family member that lives with you that can help. If possible, we also ask that you have a blood pressure cuff and scale at home to measure your blood pressure, heart rate and weight prior to your scheduled appointment. Patients with clinical needs that need an in-person evaluation and  testing will still be able to come to the office if absolutely necessary. If you have any questions, feel free to call our office.    IF YOU HAVE A SMARTPHONE, PLEASE DOWNLOAD THE WEBEX APP TO YOUR SMARTPHONE  - If Apple, go to [employer] and type in WebEx in the search bar. [place] [employer], the blue/green circle. The app is free but as with any other app download, your phone may require you to verify saved payment information or Apple password. You do NOT have to create a WebEx account.  - If Android, go to [employer] and type in [employer] in the search bar. [place] [employer], the blue/green circle. The app is free but as with any other app download, your phone may require you to verify saved payment information or Android password. You do NOT have to create a WebEx account.  It is very helpful to have this downloaded before your visit.    2-3 DAYS BEFORE YOUR APPOINTMENT  You will receive a telephone call from one of our [facility] team members - your caller ID may say "Unknown caller." If this is a video visit, we will confirm that you have been able to download the WebEx app. We will remind you check your blood pressure, heart rate and weight prior to your scheduled appointment. If you have an Apple Watch or Jodelle Red, please upload any pertinent ECG strips the day before or morning of your  appointment to [facility]. Our staff will also make sure you have reviewed the consent and agree to move forward with your scheduled tele-health visit.     THE DAY OF YOUR APPOINTMENT  Approximately 15 minutes prior to your scheduled appointment, you will receive a telephone call from one of [facility] team - your caller ID may say "Unknown caller."  Our staff will confirm medications, vital signs for the day and any symptoms you may be experiencing. Please have this information available prior to the time of visit start. It may also be helpful for you to have a pad of paper and pen  handy for any instructions given during your visit. They will also walk you through joining the WebEx smartphone meeting if this is a video visit.    CONSENT FOR TELE-HEALTH VISIT - PLEASE REVIEW  I hereby voluntarily request, consent and authorize [facility] and its employed or contracted physicians, physician assistants, nurse practitioners or other licensed health care professionals (the Practitioner), to provide me with telemedicine health care services (the Services") as deemed necessary by the treating Practitioner. I acknowledge and consent to receive the Services by the Practitioner via telemedicine. I understand that the telemedicine visit will involve communicating with the Practitioner through live audiovisual communication technology and the disclosure of certain medical information by electronic transmission. I acknowledge that I have been given the opportunity to request an in-person assessment or other available alternative prior to the telemedicine visit and am voluntarily participating in the telemedicine visit.  I understand that I have the right to withhold or withdraw my consent to the use of telemedicine in the course of my care at any time, without affecting my right to future care or treatment, and that the Practitioner or I may terminate the telemedicine visit at any time. I understand that I have the right to inspect all information obtained and/or recorded in the course of the telemedicine visit and may receive copies of available information for a reasonable fee.  I understand that some of the potential risks of receiving the Services via telemedicine include:   Delay or interruption in medical evaluation due to technological equipment failure or disruption;  Information transmitted may not be sufficient (e.g. poor resolution of images) to allow for appropriate medical decision making by the Practitioner; and/or   In rare instances, security protocols could fail, causing  a breach of personal health information.  Furthermore, I acknowledge that it is my responsibility to provide information about my medical history, conditions and care that is complete and accurate to the best of my ability. I acknowledge that Practitioner's advice, recommendations, and/or decision may be based on factors not within their control, such as incomplete or inaccurate data provided by me or distortions of diagnostic images or specimens that may result from electronic transmissions. I understand that the practice of medicine is not an exact science and that Practitioner makes no warranties or guarantees regarding treatment outcomes. I acknowledge that I will receive a copy of this consent concurrently upon execution via email to the email address I last provided but may also request a printed copy by calling the office of [facility].    I understand that my insurance will be billed for this visit.   I have read or had this consent read to me.  I understand the contents of this consent, which adequately explains the benefits and risks of the Services being provided via telemedicine.   I have been provided ample opportunity to ask questions  regarding this consent and the Services and have had my questions answered to my satisfaction.  I give my informed consent for the services to be provided through the use of telemedicine in my medical care  By participating in this telemedicine visit I agree to the above.  Patient gave verbal consent to have Televisit with Dr [name] on 10-09-18.

## 2018-10-09 ENCOUNTER — Telehealth (INDEPENDENT_AMBULATORY_CARE_PROVIDER_SITE_OTHER): Payer: Medicare Other | Admitting: Cardiology

## 2018-10-09 ENCOUNTER — Telehealth: Payer: Self-pay | Admitting: *Deleted

## 2018-10-09 ENCOUNTER — Encounter: Payer: Self-pay | Admitting: Cardiology

## 2018-10-09 VITALS — BP 140/62 | HR 54 | Ht 65.0 in | Wt 235.0 lb

## 2018-10-09 DIAGNOSIS — E782 Mixed hyperlipidemia: Secondary | ICD-10-CM

## 2018-10-09 DIAGNOSIS — I2721 Secondary pulmonary arterial hypertension: Secondary | ICD-10-CM

## 2018-10-09 DIAGNOSIS — I209 Angina pectoris, unspecified: Secondary | ICD-10-CM

## 2018-10-09 DIAGNOSIS — I739 Peripheral vascular disease, unspecified: Secondary | ICD-10-CM

## 2018-10-09 MED ORDER — PRAVASTATIN SODIUM 20 MG PO TABS: 20.0000 mg | ORAL_TABLET | ORAL | 3 refills | Status: DC

## 2018-10-09 NOTE — Addendum Note (Signed)
Addended by: Particia Nearing B on: 10/09/2018 11:10 AM   Modules accepted: Orders

## 2018-10-09 NOTE — Progress Notes (Signed)
Virtual Visit via Telephone Note   This visit type was conducted due to national recommendations for restrictions regarding the COVID-19 Pandemic (e.g. social distancing) in an effort to limit this patient's exposure and mitigate transmission in our community.  Due to her co-morbid illnesses, this patient is at least at moderate risk for complications without adequate follow up.  This format is felt to be most appropriate for this patient at this time.  The patient did not have access to video technology/had technical difficulties with video requiring transitioning to audio format only (telephone).  All issues noted in this document were discussed and addressed.  No physical exam could be performed with this format.  Please refer to the patient's chart for her  consent to telehealth for [facility].   Evaluation Performed:  Follow-up visit  Date:  10/09/2018   ID:  Miranda Moses, DOB April 29, 1941, MRN [id number]  Patient Location: Home Provider Location: Office  PCP:  [name], PA-C  Cardiologist:  No primary care provider on file. Dr [name] Electrophysiologist:  None   Chief Complaint:  FU for CTA cardiac  History of Present Illness:    Miranda Moses is a 78 y.o. female with a hx of chest pain SOB  hypertension dyslipidemia last seen  08/28/18.  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  She just does not have the technological expertise or understanding of her phone  She has been seen in the past for exertional chest pain sounds like typical angina advised myocardial perfusion study she also has shortness of breath edema and echocardiogram that imply pulmonary artery hypertension in 2019. She is told she has peripheral arterial disease but does not have claudication she has CT of the abdomen and pelvis that showed atherosclerosis but no stenosis and then tells me that she had vascular studies done at [facility] and inquired of the results.   Despite echo findings of elevated left atrial pressure elevated pulmonary artery systolic pressure her proBNP level was low not in range consistent with heart failure or clinical pulmonary hypertension.  Unfortunately her cardiac CTA was rescheduled and pending with COVID-19.   She is a little frustrated that her cardiac CTA was deferred macular class I and will try to get this done as soon as we reopen outpatient elective diagnostic testing.  She is not having chest pain but she does have variable shortness of breath with exertion no edema orthopnea palpitation or syncope.  She does have calf claudication when she walks outdoors perhaps 100 foot no rest pain.  She has taken atorvastatin but tolerates it poorly with muscle symptoms and will decrease her to pravastatin less potent 2 days a week for class effect.  She may require combined therapy with either Zetia or PCSK9 depending on her cardiac CTA.  Fortunately she does not add salt to her diet.  The patient does not have symptoms concerning for COVID-19 infection (fever, chills, cough, or new shortness of breath).    Past Medical History:  Diagnosis Date  . GERD (gastroesophageal reflux disease)   . Hyperlipidemia   . Hypertension   . Pre-diabetes    Past Surgical History:  Procedure Laterality Date  . ABDOMINAL HYSTERECTOMY    . CATARACT EXTRACTION Left 03/2014  . CATARACT EXTRACTION Right 05/2014  . COLONOSCOPY  11/05/2013     Current Meds  Medication Sig  . aspirin EC 81 MG tablet Take 81 mg by mouth daily.  Marland Kitchen  atorvastatin (LIPITOR) 10 MG tablet Take 10 mg by mouth daily. 2 - 3 times per week  . bisoprolol (ZEBETA) 5 MG tablet Take 2.5 mg by mouth daily.  . furosemide (LASIX) 20 MG tablet Take 20 mg by mouth as needed.   Marland Kitchen omeprazole (PRILOSEC) 20 MG capsule Take 20 mg by mouth daily.   Marland Kitchen triamcinolone cream (KENALOG) 0.5 % Apply 1 application topically daily as needed.     Allergies:   Clarithromycin; Codeine; and Penicillins    Social History   Tobacco Use  . Smoking status: Never Smoker  . Smokeless tobacco: Never Used  Substance Use Topics  . Alcohol use: Never    Frequency: Never  . Drug use: Never     Family Hx: The patient's family history includes Heart disease in her father.  ROS:   Please see the history of present illness.     All other systems reviewed and are negative.   Prior CV studies:   The following studies were reviewed today:  I reviewed the ABIs at [facility] July 2019.  The ABI was near normal to mildly reduced bilaterally 0.9-1.0 TBI's were diminished and the thigh arteries were noncompressible.  Echocardiogram [facility] 01/14/2018 showed an ejection fraction left ventricle 50 to 55% severe left atrial enlargement elevated left atrial pressure the IVC was dilated and pulmonary artery systolic pressure was elevated in the range of 65 mmHg  Labs/Other Tests and Data Reviewed:    EKG:  An ECG dated 08/28/18 was personally reviewed today and demonstrated:  sinus bradycardia 54 bpm otherwise normal  Recent Labs: 08/28/2018: BUN 17; Creatinine, Ser 0.93; NT-Pro BNP 154; Potassium 4.4; Sodium 140   Cholesterol 186 HDL 51 LDL 114  Wt Readings from Last 3 Encounters:  10/09/18 235 lb (106.6 kg)  08/28/18 234 lb 12.8 oz (106.5 kg)  02/18/18 248 lb (112.5 kg)     Objective:    Vital Signs:  BP 140/62 (BP Location: Left Arm, Patient Position: Sitting)   Pulse (!) 54   Ht 5\' 5"  (1.651 m)   Wt 235 lb (106.6 kg)   BMI 39.11 kg/m    She is alert oriented mood affect normal thought process and cognition are normal.  She had no audible wheezing and was in no distress  ASSESSMENT & PLAN:    1. Angina pectoris continue aspirin and lipid-lowering therapy and await results of cardiac CTA for presents extent severity of CAD and to assess pulmonary artery diameter regarding elevated systolic pressure on echocardiogram.  Her clinical course is inconsistent with severe  pulmonary hypertension and if she were to undergo coronary angiography had wanted to have a right heart catheterization performed. 2. Await results of cardiac CTA of the pulmonary arteries significantly dilated she would benefit from hemodynamic assessment 3. Follow-up lower extremity vascular duplex in our office on schedule this for the second week of May.  Also recheck echocardiogram 4. Poorly tolerant of atorvastatin discontinue switch to pravastatin.  COVID-19 Education: The signs and symptoms of COVID-19 were discussed with the patient and how to seek care for testing (follow up with PCP or arrange E-visit).  The importance of social distancing was discussed today.  Time:   Today, I have spent 25 minutes with the patient with telehealth technology discussing the above problems.     Medication Adjustments/Labs and Tests Ordered: Current medicines are reviewed at length with the patient today.  Concerns regarding medicines are outlined above.   Tests Ordered: No orders of  the defined types were placed in this encounter.   Medication Changes: No orders of the defined types were placed in this encounter.   Disposition:  Follow up June 2020  SignedShirlee More, MD  10/09/2018 10:29 AM    [facility]

## 2018-10-09 NOTE — Patient Instructions (Signed)
Medication Instructions:  Your physician has recommended you make the following change in your medication:   STOP: Atorvastatin for 2 weeks then START: Pravastatin 20mg  2 x a week  If you need a refill on your cardiac medications before your next appointment, please call your pharmacy.   Lab work: None  If you have labs (blood work) drawn today and your tests are completely normal, you will receive your results only by: Miranda Moses MyChart Message (if you have MyChart) OR . A paper copy in the mail If you have any lab test that is abnormal or we need to change your treatment, we will call you to review the results.  Testing/Procedures: Your physician has requested that you have an echocardiogram. Echocardiography is a painless test that uses sound waves to create images of your heart. It provides your doctor with information about the size and shape of your heart and how well your heart's chambers and valves are working. This procedure takes approximately one hour. There are no restrictions for this procedure.  Your physician has requested that you have a lower  extremity venous duplex. This test is an ultrasound of the veins in the legs or arms. It looks at venous blood flow that carries blood from the heart to the legs or arms. Allow one hour for a Lower Venous exam. Allow thirty minutes for an Upper Venous exam. There are no restrictions or special instructions.    Follow-Up: At [facility], you and your health needs are our priority.  As part of our continuing mission to provide you with exceptional heart care, we have created designated Provider Care Teams.  These Care Teams include your primary Cardiologist (physician) and Advanced Practice Providers (APPs -  Physician Assistants and Nurse Practitioners) who all work together to provide you with the care you need, when you need it. You will need a follow up appointment in 2 months.   Any Other Special Instructions Will Be Listed Below (If  Applicable).   Pravastatin tablets What is this medicine? PRAVASTATIN (PRA va stat in) is known as a HMG-CoA reductase inhibitor or 'statin'. It lowers the level of cholesterol and triglycerides in the blood. This drug may also reduce the risk of heart attack, stroke, or other health problems in patients with risk factors for heart disease. Diet and lifestyle changes are often used with this drug. This medicine may be used for other purposes; ask your health care provider or pharmacist if you have questions. COMMON BRAND NAME(S): Pravachol What should I tell my health care provider before I take this medicine? They need to know if you have any of these conditions: -diabetes -if you often drink alcohol -history of stroke -kidney disease -liver disease -muscle aches or weakness -thyroid disease -an unusual or allergic reaction to pravastatin, other medicines, foods, dyes, or preservatives -pregnant or trying to get pregnant -breast-feeding How should I use this medicine? Take pravastatin tablets by mouth. Swallow the tablets with a drink of water. Pravastatin can be taken at anytime of the day, with or without food. Follow the directions on the prescription label. Take your doses at regular intervals. Do not take your medicine more often than directed. Talk to your pediatrician regarding the use of this medicine in children. Special care may be needed. Pravastatin has been used in children as young as 76 years of age. Overdosage: If you think you have taken too much of this medicine contact a poison control center or emergency room at once. NOTE: This  medicine is only for you. Do not share this medicine with others. What if I miss a dose? If you miss a dose, take it as soon as you can. If it is almost time for your next dose, take only that dose. Do not take double or extra doses. What may interact with this medicine? This medicine may interact with the following medications: -colchicine  -cyclosporine -other medicines for high cholesterol -some antibiotics like azithromycin, clarithromycin, erythromycin, and telithromycin This list may not describe all possible interactions. Give your health care provider a list of all the medicines, herbs, non-prescription drugs, or dietary supplements you use. Also tell them if you smoke, drink alcohol, or use illegal drugs. Some items may interact with your medicine. What should I watch for while using this medicine? Visit your doctor or health care professional for regular check-ups. You may need regular tests to make sure your liver is working properly. Your health care professional may tell you to stop taking this medicine if you develop muscle problems. If your muscle problems do not go away after stopping this medicine, contact your health care professional. Do not become pregnant while taking this medicine. Women should inform their health care professional if they wish to become pregnant or think they might be pregnant. There is a potential for serious side effects to an unborn child. Talk to your health care professional or pharmacist for more information. Do not breast-feed an infant while taking this medicine. This medicine may affect blood sugar levels. If you have diabetes, check with your doctor or health care professional before you change your diet or the dose of your diabetic medicine. If you are going to need surgery or other procedure, tell your doctor that you are using this medicine. This drug is only part of a total heart-health program. Your doctor or a dietician can suggest a low-cholesterol and low-fat diet to help. Avoid alcohol and smoking, and keep a proper exercise schedule. This medicine may cause a decrease in Co-Enzyme Q-10. You should make sure that you get enough Co-Enzyme Q-10 while you are taking this medicine. Discuss the foods you eat and the vitamins you take with your health care professional. What side effects  may I notice from receiving this medicine? Side effects that you should report to your doctor or health care professional as soon as possible: -allergic reactions like skin rash, itching or hives, swelling of the face, lips, or tongue -dark urine -fever -muscle pain, cramps, or weakness -redness, blistering, peeling or loosening of the skin, including inside the mouth -trouble passing urine or change in the amount of urine -unusually weak or tired -yellowing of the eyes or skin Side effects that usually do not require medical attention (report to your doctor or health care professional if they continue or are bothersome): -gas -headache -heartburn -indigestion -stomach pain This list may not describe all possible side effects. Call your doctor for medical advice about side effects. You may report side effects to FDA at 1-800-FDA-1088. Where should I keep my medicine? Keep out of the reach of children. Store at room temperature between 15 to 30 degrees C (59 to 86 degrees F). Protect from light. Keep container tightly closed. Throw away any unused medicine after the expiration date. NOTE: This sheet is a summary. It may not cover all possible information. If you have questions about this medicine, talk to your doctor, pharmacist, or health care provider.  2019 Elsevier/Gold Standard (2017-02-11 12:37:09)  Echocardiogram An echocardiogram is a procedure  that uses painless sound waves (ultrasound) to produce an image of the heart. Images from an echocardiogram can provide important information about:  Signs of coronary artery disease (CAD).  Aneurysm detection. An aneurysm is a weak or damaged part of an artery wall that bulges out from the normal force of blood pumping through the body.  Heart size and shape. Changes in the size or shape of the heart can be associated with certain conditions, including heart failure, aneurysm, and CAD.  Heart muscle function.  Heart valve function.   Signs of a past heart attack.  Fluid buildup around the heart.  Thickening of the heart muscle.  A tumor or infectious growth around the heart valves. Tell a health care provider about:  Any allergies you have.  All medicines you are taking, including vitamins, herbs, eye drops, creams, and over-the-counter medicines.  Any blood disorders you have.  Any surgeries you have had.  Any medical conditions you have.  Whether you are pregnant or may be pregnant. What are the risks? Generally, this is a safe procedure. However, problems may occur, including:  Allergic reaction to dye (contrast) that may be used during the procedure. What happens before the procedure? No specific preparation is needed. You may eat and drink normally. What happens during the procedure?   An IV tube may be inserted into one of your veins.  You may receive contrast through this tube. A contrast is an injection that improves the quality of the pictures from your heart.  A gel will be applied to your chest.  A wand-like tool (transducer) will be moved over your chest. The gel will help to transmit the sound waves from the transducer.  The sound waves will harmlessly bounce off of your heart to allow the heart images to be captured in real-time motion. The images will be recorded on a computer. The procedure may vary among health care providers and hospitals. What happens after the procedure?  You may return to your normal, everyday life, including diet, activities, and medicines, unless your health care provider tells you not to do that. Summary  An echocardiogram is a procedure that uses painless sound waves (ultrasound) to produce an image of the heart.  Images from an echocardiogram can provide important information about the size and shape of your heart, heart muscle function, heart valve function, and fluid buildup around your heart.  You do not need to do anything to prepare before this  procedure. You may eat and drink normally.  After the echocardiogram is completed, you may return to your normal, everyday life, unless your health care provider tells you not to do that. This information is not intended to replace advice given to you by your health care provider. Make sure you discuss any questions you have with your health care provider. Document Released: 06/07/2000 Document Revised: 07/13/2016 Document Reviewed: 07/13/2016 Elsevier Interactive Patient Education  2019 [employer].

## 2018-10-14 ENCOUNTER — Ambulatory Visit (INDEPENDENT_AMBULATORY_CARE_PROVIDER_SITE_OTHER): Payer: Medicare Other

## 2018-10-14 ENCOUNTER — Other Ambulatory Visit: Payer: Self-pay

## 2018-10-14 DIAGNOSIS — I2721 Secondary pulmonary arterial hypertension: Secondary | ICD-10-CM | POA: Diagnosis not present

## 2018-10-14 DIAGNOSIS — I739 Peripheral vascular disease, unspecified: Secondary | ICD-10-CM | POA: Diagnosis not present

## 2018-10-14 NOTE — Progress Notes (Signed)
Bilateral lower extremity duplex exam has been performed. Mild stenosis 30-49% was seen in the external iliacs bilaterally.  Jimmy Geran Haithcock RDCS, RVT

## 2018-10-14 NOTE — Progress Notes (Signed)
Complete echocardiogram has been preformed.  Jimmy Rucha Wissinger RDCS, RVT

## 2018-10-19 ENCOUNTER — Other Ambulatory Visit: Payer: Self-pay | Admitting: *Deleted

## 2018-10-19 DIAGNOSIS — I2721 Secondary pulmonary arterial hypertension: Secondary | ICD-10-CM

## 2018-10-19 DIAGNOSIS — I110 Hypertensive heart disease with heart failure: Secondary | ICD-10-CM

## 2018-10-19 DIAGNOSIS — I739 Peripheral vascular disease, unspecified: Secondary | ICD-10-CM

## 2018-10-19 NOTE — Telephone Encounter (Signed)
Telephone call to patient. Informed patient she needs a bmp 3-7 days to 11/06/18 CT appointment. Patient vebalized understanding

## 2018-11-02 DIAGNOSIS — Z23 Encounter for immunization: Secondary | ICD-10-CM | POA: Diagnosis not present

## 2018-11-02 DIAGNOSIS — E782 Mixed hyperlipidemia: Secondary | ICD-10-CM | POA: Diagnosis not present

## 2018-11-02 DIAGNOSIS — K219 Gastro-esophageal reflux disease without esophagitis: Secondary | ICD-10-CM | POA: Diagnosis not present

## 2018-11-02 DIAGNOSIS — Z0000 Encounter for general adult medical examination without abnormal findings: Secondary | ICD-10-CM | POA: Diagnosis not present

## 2018-11-02 DIAGNOSIS — Z1231 Encounter for screening mammogram for malignant neoplasm of breast: Secondary | ICD-10-CM | POA: Diagnosis not present

## 2018-11-02 DIAGNOSIS — Z6836 Body mass index (BMI) 36.0-36.9, adult: Secondary | ICD-10-CM | POA: Diagnosis not present

## 2018-11-02 DIAGNOSIS — R7301 Impaired fasting glucose: Secondary | ICD-10-CM | POA: Diagnosis not present

## 2018-11-02 DIAGNOSIS — I10 Essential (primary) hypertension: Secondary | ICD-10-CM | POA: Diagnosis not present

## 2018-11-05 ENCOUNTER — Telehealth: Payer: Self-pay | Admitting: Cardiology

## 2018-11-05 ENCOUNTER — Telehealth (HOSPITAL_COMMUNITY): Payer: Self-pay | Admitting: Emergency Medicine

## 2018-11-05 NOTE — Telephone Encounter (Signed)
Phoned [facility] to have labs refaxed to our office. Called and informed patient that labs look OK, should not prevent her from having her test tomorrow. She verbalizes understanding and has no further questions.

## 2018-11-05 NOTE — Telephone Encounter (Signed)
Patient had labs done Monday and wants to know if those are ok to cover what she needs to have done before testing tomorrow.

## 2018-11-05 NOTE — Telephone Encounter (Signed)
Left message on voicemail with name and callback number Marchia Bond RN Navigator Cardiac Imaging [facility] [phone] Office [phone] Cell  mychart inactive

## 2018-11-05 NOTE — Telephone Encounter (Signed)
Pt returning phone call regarding upcoming cardiac imaging study; pt verbalizes understanding of appt date/time, parking situation and where to check in, pre-test NPO status and medications ordered, and verified current allergies; name and call back number provided for further questions should they arise Miranda Bond RN Navigator Cardiac Imaging [place] [facility] [phone] office [phone] cell  Pt denies covid symptoms, verbalized understanding of visitor policy,  Pt reports HR trends in the 40-50s, advised to hold lopressor prior to test, will also lasix prior to test

## 2018-11-06 ENCOUNTER — Ambulatory Visit (HOSPITAL_COMMUNITY)
Admission: RE | Admit: 2018-11-06 | Discharge: 2018-11-06 | Disposition: A | Discharge status: Home / Self Care | Payer: Medicare Other | Source: Ambulatory Visit | Attending: Cardiology | Admitting: Cardiology

## 2018-11-06 ENCOUNTER — Encounter: Payer: Medicare Other | Admitting: *Deleted

## 2018-11-06 ENCOUNTER — Other Ambulatory Visit: Payer: Self-pay

## 2018-11-06 VITALS — Temp 97.5°F

## 2018-11-06 DIAGNOSIS — I209 Angina pectoris, unspecified: Secondary | ICD-10-CM | POA: Insufficient documentation

## 2018-11-06 DIAGNOSIS — Z006 Encounter for examination for normal comparison and control in clinical research program: Secondary | ICD-10-CM

## 2018-11-06 IMAGING — CT CT HEAR MORPH WITH CTA COR WITH SCORE WITH CA WITH CONTRAST AND
4 of 7 series · 8 of 20 positions shown, 9 images · IV contrast (APPLIED)
Comparison: None.
COMPARISON: None.

Addendum:
EXAM:
OVER-READ INTERPRETATION  CT CHEST

The following report is an over-read performed by radiologist Dr.
does not include interpretation of cardiac or coronary anatomy or
pathology. The coronary calcium score/coronary CTA interpretation by
the cardiologist is attached.
CLINICAL DATA: 78-year-old female with hyperlipemia, hypertension
and chest pain.
Cardiac/Coronary  CT
TECHNIQUE: The patient was scanned on a Phillips Force scanner.

[Series 6: best diast 76 % · axial · 0.39mm/px · z∈[+1187,+1225]mm · 2 of 283 slices shown]
[im 95/283  vessel]
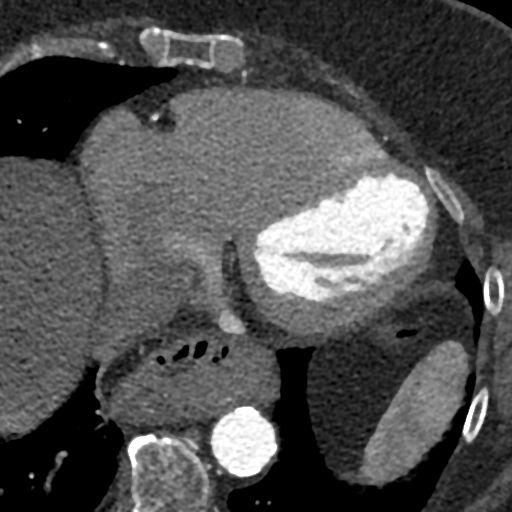
[im 189/283  vessel]
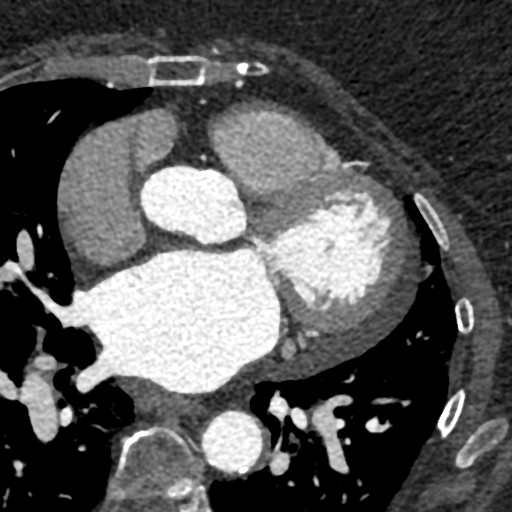

[Series 7: best syst · axial · 0.39mm/px · z∈[+1187,+1225]mm · 2 of 283 slices shown, 3 images]
[im 95/283  vessel]
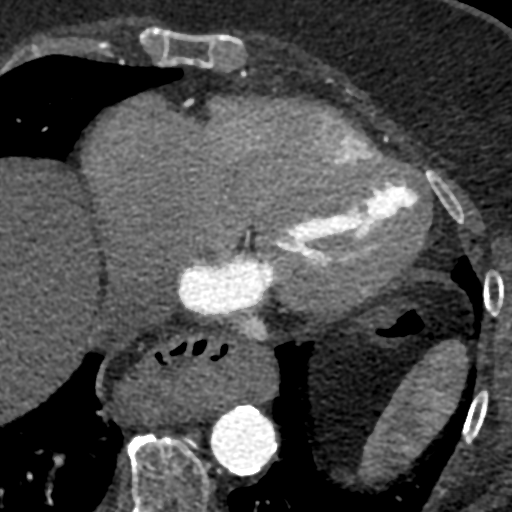
[im 95/283  lung]
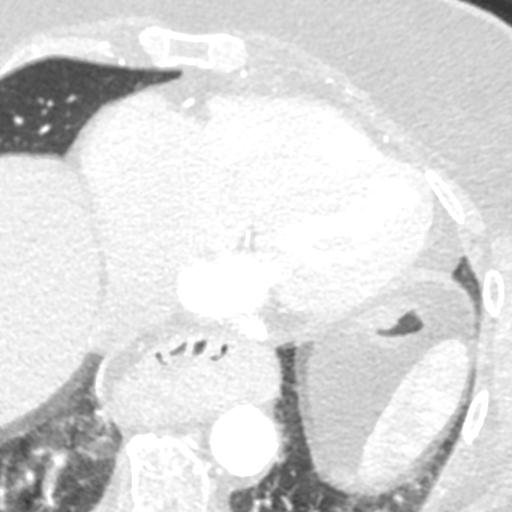
[im 189/283  vessel]
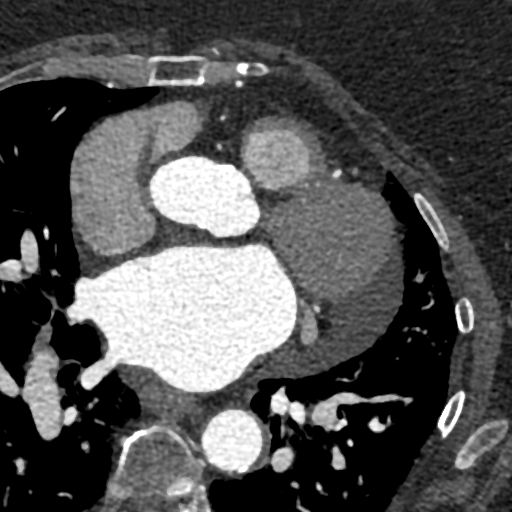

[Series 8: ts diast sharp 76 % · axial · 0.39mm/px · z∈[+1187,+1225]mm · 2 of 283 slices shown]
[im 95/283  lung]
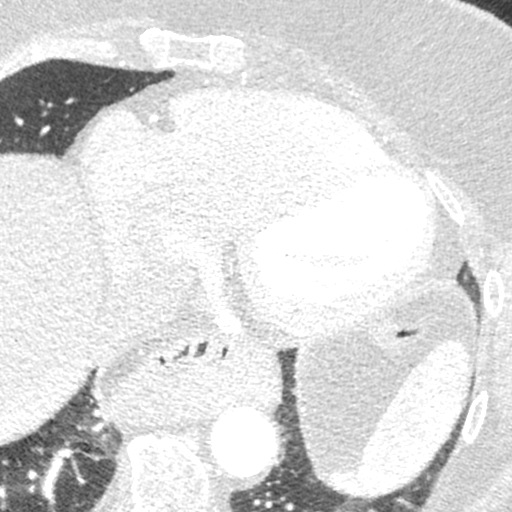
[im 189/283  lung]
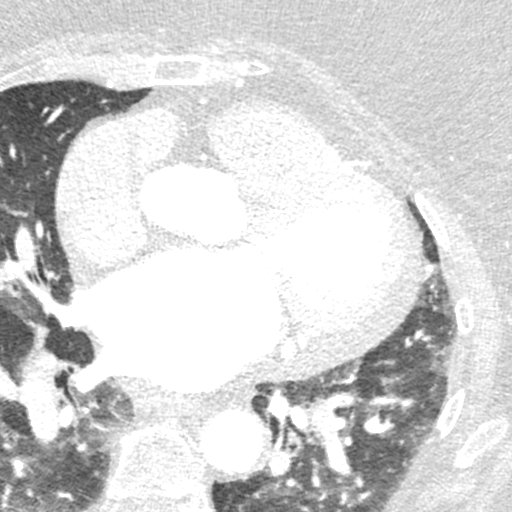

[Series 9: ts syst sharp · axial · 0.39mm/px · z∈[+1187,+1225]mm · 2 of 283 slices shown]
[im 95/283  lung]
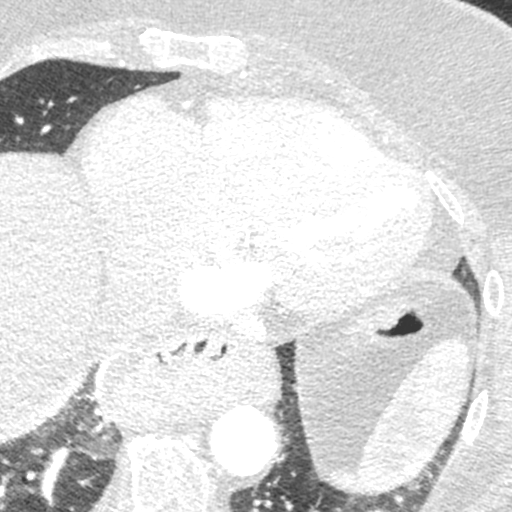
[im 189/283  lung]
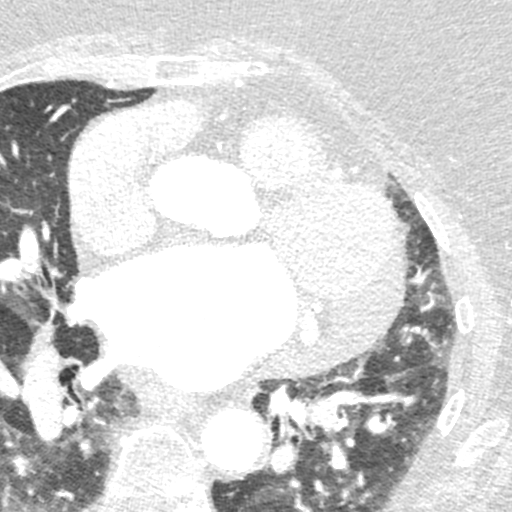

[8 of 20 positions shown; findings below may reference images not displayed]

FINDINGS: The visualized portions of the lower lung fields show no suspicious
nodules, masses, or infiltrates. A small to moderate hiatal hernia
is noted.
IMPRESSION: Small to moderate hiatal hernia.
FINDINGS: A 120 kV prospective scan was triggered in the descending thoracic
aorta at 111 HU's. Axial non-contrast 3 mm slices were carried out
through the heart. The data set was analyzed on a dedicated work
station and scored using the Agatson method. Gantry rotation speed
was 250 msecs and collimation was .6 mm. No beta blockade and 0.8 mg
of sl NTG was given. The 3D data set was reconstructed in 5%
intervals of the 67-82 % of the R-R cycle. Diastolic phases were
analyzed on a dedicated work station using MPR, MIP and VRT modes.
The patient received 80 cc of contrast.

Aorta: Normal size. Moderate diffuse atherosclerotic plaque and
calcifications. No dissection.

Aortic Valve:  Trileaflet.  Trivial calcifications.

Coronary Arteries:  Normal coronary origin.  Right dominance.

RCA is a medium caliber dominant artery that gives rise to poorly
visualized PDA and PLA. There is severe calcified plaque in the
ostial portion of RCA extending into the proximal portion with
stenosis > 70%. Mid and distal RCA have only minimal plaque.

Left main is a short artery that gives rise to LAD and LCX arteries.
There is mild calcified plaque with associated stenosis 25-50%.

LAD is a medium size vessel. There is a moderate predominantly
calcified plaque in the proximal LAD with associated stenosis
50-69%. Mid and distal LAD has mild diffuse calcified plaque with
stenosis 25-49%.

LCX is a non-dominant artery that gives rise to one small OM1
branch. There is moderate calcified plaque in the proximal portion
with stenosis 50-69%, mid, distal LCx artery and OM1 have small
lumen and mild diffuse predominantly calcified plaque with stenosis
25-49%.

Other findings:

Normal pulmonary vein drainage into the left atrium.

Normal let atrial appendage without a thrombus.

Normal size of the pulmonary artery.
IMPRESSION: 1. Coronary calcium score of 6349. This was 95 percentile for age
and sex matched control.

2. Normal coronary origin with right dominance.

3. Diffuse plaque with > 70% stenosis in the ostial/proximal RCA and
moderate stenosis in the proximal LAD and LCX arteries. Additional
analysis with CT FFR will be submitted.

*** End of Addendum ***
EXAM:
OVER-READ INTERPRETATION  CT CHEST

The following report is an over-read performed by radiologist Dr.
does not include interpretation of cardiac or coronary anatomy or
pathology. The coronary calcium score/coronary CTA interpretation by
the cardiologist is attached.
FINDINGS: The visualized portions of the lower lung fields show no suspicious
nodules, masses, or infiltrates. A small to moderate hiatal hernia
is noted.
IMPRESSION: Small to moderate hiatal hernia.

## 2018-11-06 MED ORDER — NITROGLYCERIN 0.4 MG SL SUBL
SUBLINGUAL_TABLET | SUBLINGUAL | Status: AC
  Filled 2018-11-06: qty 2

## 2018-11-06 MED ORDER — NITROGLYCERIN 0.4 MG SL SUBL
0.8000 mg | SUBLINGUAL_TABLET | SUBLINGUAL | Status: DC | PRN
  Administered 2018-11-06: 10:00:00 0.8 mg via SUBLINGUAL

## 2018-11-06 MED ORDER — IOHEXOL 350 MG/ML SOLN
100.0000 mL | Freq: Once | INTRAVENOUS | Status: AC | PRN
  Administered 2018-11-06: 100 mL via INTRAVENOUS

## 2018-11-06 NOTE — Research (Signed)
Subject met inclusion and exclusion criteria.  The informed consent form, study requirements and expectations were reviewed with the subject and questions and concerns were addressed prior to the signing of the consent form.  The subject verbalized understanding of the trial requirements.  The subject agreed to participate in the [id number] trial and signed the informed consent.  The informed consent was obtained prior to performance of any protocol-specific procedures for the subject.  A copy of the signed informed consent was given to the subject and a copy was placed in the subject's medical record. 

## 2018-11-11 NOTE — Addendum Note (Signed)
Addended by: Dorothy Spark on: 11/11/2018 02:13 PM   Modules accepted: Orders

## 2018-11-12 ENCOUNTER — Telehealth: Payer: Self-pay | Admitting: *Deleted

## 2018-11-12 NOTE — Telephone Encounter (Signed)
-----   Message from Austin Miles, RN sent at 11/12/2018  8:40 AM EDT -----  ----- Message ----- From: Richardo Priest, MD Sent: 11/11/2018   5:56 PM EDT To: Stevan Born, CMA  Normal or stable result  Her CT scan shows no findings pulmonary artery hypertension  She does have moderate obstruction in her multiple coronary artery and will have additional fractional flow reserve and I will have the findings that we can decide whether medications or other treatment is appropriate at our office follow-up which should be scheduled in 2 weeks and we can do a virtual visit

## 2018-11-12 NOTE — Telephone Encounter (Signed)
Telephone call to patient . No answer and no voice mail available

## 2018-11-12 NOTE — Telephone Encounter (Signed)
Patient called back. Informed of results of Ct  Scan and that Dr. [name] will go more in depth at next appointment. No further questions .

## 2018-11-12 NOTE — Telephone Encounter (Signed)
Able to leave message for patient to call back regarding CT results.

## 2018-11-18 ENCOUNTER — Telehealth: Payer: Self-pay

## 2018-11-18 DIAGNOSIS — I739 Peripheral vascular disease, unspecified: Secondary | ICD-10-CM

## 2018-11-18 NOTE — Telephone Encounter (Signed)
Patient notified that she will need to have lower extremity arterial duplex study prior to appointment on December 11, 2018.  Patient prefers to have test done in the [place] office.  Patient will be notified by Langley Gauss once test has been scheduled. Patient agreed to plan and verbalized understanding.

## 2018-11-18 NOTE — Telephone Encounter (Signed)
-----   Message from Richardo Priest, MD sent at 11/12/2018  9:19 AM EDT ----- Normal or stable result  I phoned her regarding the results of her CTA and we decided  to treat her medically and she has had no anginal discomfort she has her husband's nitroglycerin at home and does not want to go to the pharmacy at this time.  She is having claudication in her legs she had an abnormal ABI performed last year and I like to put her in for lower extremity arterial duplex study class II and perform this prior to office follow-up at the end of June

## 2018-12-11 ENCOUNTER — Telehealth: Payer: Medicare Other | Admitting: Cardiology

## 2018-12-15 ENCOUNTER — Telehealth: Payer: Self-pay | Admitting: Cardiology

## 2018-12-15 NOTE — Telephone Encounter (Signed)
Patient is asking for results and has questions about echo

## 2018-12-15 NOTE — Telephone Encounter (Signed)
Reviewed stable echocardiogram and lower extremity arterial duplex completed on 10/14/2018. Reminded patient of her appointment for the lower extremity arterial duplex scheduled on Thursday, 12/17/2018, at 11:15 am in the [place] office. Patient verbalized understanding. No further questions.

## 2018-12-17 ENCOUNTER — Ambulatory Visit (INDEPENDENT_AMBULATORY_CARE_PROVIDER_SITE_OTHER): Payer: Medicare Other

## 2018-12-17 DIAGNOSIS — I739 Peripheral vascular disease, unspecified: Secondary | ICD-10-CM | POA: Diagnosis not present

## 2018-12-17 NOTE — Progress Notes (Signed)
ABI Bilateral lower extremity doppler performed   12/17/18 Cardell Peach RDCS, RVT

## 2018-12-21 DIAGNOSIS — K615 Supralevator abscess: Secondary | ICD-10-CM | POA: Diagnosis not present

## 2018-12-21 DIAGNOSIS — Z86010 Personal history of colonic polyps: Secondary | ICD-10-CM | POA: Diagnosis not present

## 2018-12-21 DIAGNOSIS — K635 Polyp of colon: Secondary | ICD-10-CM | POA: Diagnosis not present

## 2018-12-21 DIAGNOSIS — Z1211 Encounter for screening for malignant neoplasm of colon: Secondary | ICD-10-CM | POA: Diagnosis not present

## 2018-12-23 ENCOUNTER — Other Ambulatory Visit: Payer: Self-pay

## 2018-12-23 ENCOUNTER — Telehealth (INDEPENDENT_AMBULATORY_CARE_PROVIDER_SITE_OTHER): Payer: Medicare Other | Admitting: Cardiology

## 2018-12-23 DIAGNOSIS — I25118 Atherosclerotic heart disease of native coronary artery with other forms of angina pectoris: Secondary | ICD-10-CM

## 2018-12-23 DIAGNOSIS — I2721 Secondary pulmonary arterial hypertension: Secondary | ICD-10-CM

## 2018-12-23 DIAGNOSIS — I110 Hypertensive heart disease with heart failure: Secondary | ICD-10-CM

## 2018-12-23 DIAGNOSIS — I739 Peripheral vascular disease, unspecified: Secondary | ICD-10-CM

## 2018-12-23 DIAGNOSIS — E782 Mixed hyperlipidemia: Secondary | ICD-10-CM

## 2018-12-23 NOTE — Progress Notes (Signed)
error 

## 2019-01-01 ENCOUNTER — Ambulatory Visit (INDEPENDENT_AMBULATORY_CARE_PROVIDER_SITE_OTHER): Payer: Medicare Other | Admitting: Cardiology

## 2019-01-01 ENCOUNTER — Other Ambulatory Visit: Payer: Self-pay

## 2019-01-01 VITALS — BP 130/78 | HR 57 | Temp 98.6°F | Ht 65.0 in | Wt 235.2 lb

## 2019-01-01 DIAGNOSIS — I25118 Atherosclerotic heart disease of native coronary artery with other forms of angina pectoris: Secondary | ICD-10-CM | POA: Diagnosis not present

## 2019-01-01 DIAGNOSIS — I739 Peripheral vascular disease, unspecified: Secondary | ICD-10-CM | POA: Diagnosis not present

## 2019-01-01 DIAGNOSIS — E782 Mixed hyperlipidemia: Secondary | ICD-10-CM

## 2019-01-01 DIAGNOSIS — I110 Hypertensive heart disease with heart failure: Secondary | ICD-10-CM

## 2019-01-01 DIAGNOSIS — I2721 Secondary pulmonary arterial hypertension: Secondary | ICD-10-CM

## 2019-01-01 MED ORDER — ISOSORBIDE MONONITRATE ER 30 MG PO TB24: 30.0000 mg | ORAL_TABLET | Freq: Every day | ORAL | 3 refills | Status: DC

## 2019-01-01 MED ORDER — FUROSEMIDE 40 MG PO TABS: 40.0000 mg | ORAL_TABLET | Freq: Every day | ORAL | 3 refills | Status: DC

## 2019-01-01 NOTE — Progress Notes (Signed)
Cardiology Office Note:    Date:  01/01/2019   ID:  [name], DOB 14-Nov-1940, MRN [id number]  PCP:  [name], PA-C  Cardiologist:  [name], MD    Referring MD: [name], PA-C    ASSESSMENT:    1. Coronary artery disease of native artery of native heart with stable angina pectoris ([place])   2. Hypertensive heart disease with heart failure ([place])   3. PAH (pulmonary artery hypertension) ([place])   4. Mixed dyslipidemia   5. PAD (peripheral artery disease) ([facility])    PLAN:    In order of problems listed above:  1. CAD stable we will intensify medical therapy adding oral nitrates reassess response at this time I would not advise revascularization 2. Hypertension stable BP at target on her beta-blocker 3. Pulmonary hypertension is secondary to heart failure heart failure is decompensated she needs to take an adequate dose of the diuretic on a regular basis.  I will ask her PCP to recheck renal function proBNP level and office follow-up 4. Continue her current low dose low intensity statin she will need a follow-up lipid profile and lipid profile in her PCP office 5. Mild CAD not having claudication   Next appointment: 3 months   Medication Adjustments/Labs and Tests Ordered: Current medicines are reviewed at length with the patient today.  Concerns regarding medicines are outlined above.  No orders of the defined types were placed in this encounter.  Meds ordered this encounter  Medications  . isosorbide mononitrate (IMDUR) 30 MG 24 hr tablet    Sig: Take 1 tablet (30 mg total) by mouth daily.    Dispense:  30 tablet    Refill:  3  . furosemide (LASIX) 40 MG tablet    Sig: Take 1 tablet (40 mg total) by mouth daily.    Dispense:  30 tablet    Refill:  3    No chief complaint on file.   History of Present Illness:    Miranda Moses is a 78 y.o. female with a hx of chest pain SOB  hypertension dyslipidemia last seen 10/09/2018. She has been seen in the past  for exertional chest pain sounds like typical angina advised myocardial perfusion study she also has shortness of breath edema and echocardiogram that imply pulmonary artery hypertension in 2019. She is told she has peripheral arterial disease but does not have claudication she has CT of the abdomen and pelvis that showed atherosclerosis but no stenosis and then tells me that she had vascular studies done at [facility] and inquired of the results.  Despite echo findings of elevated left atrial pressure elevated pulmonary artery systolic pressure her proBNP level was low not in range consistent with heart failure or clinical pulmonary hypertension.   I reviewed her testing with her including:  Echocardiogram performed 10/14/2018 showed ejection fraction 60 to 65% normal right ventricular size and function moderate left atrial enlargement and pulmonary hypertension with a estimated peak pulmonary systolic pressure 65 mmHg.  Left ventricular diastolic dysfunction was seen with pseudonormalization consistent with diastolic heart failure.  Lower extremity ABI was normal in the right lower extremity and mildly reduced in the left lower extremity.  Cardiac CTA 11/06/2018 showed the pulmonary arteries being normal size.  The left main coronary artery mild 25 to 50% stenosis.  The left anterior descending coronary artery proximally had a 50 to 69% stenosis with normal FFR in the mid and distal vessel had diffuse disease.  Left circumflex coronary artery had  moderate proximal 50 to 69% stenosis in the right coronary artery had a severe calcific plaque in the ostial right coronary artery extending into the proximal portion with stenosis greater than 70%.  The study was sent for [facility] which showed diminished flow in the distal left circumflex only.  Compliance with diet, lifestyle and medications: Yes  General she is doing better she had one episode of angina after doing vigorous outdoor work did not require  nitroglycerin.  Presently she is not short of breath she has no orthopnea but continues to have edema and is taking her diuretic sporadically.  She does not have claudication.  I reviewed the results of her cardiac CTA that she has CAD and she has evidence of diminished flow only in the distal left circumflex artery in this circumstance medical therapy is preferred and if she fails you could consider angiography and percutaneous intervention.  I will intensify her medical treatment adding oral nitrates to her aspirin beta-blocker and statin and plan to see her in 3 months to assess response.  Her congestive heart failure is decompensated she is fluid overloaded and her pulmonary hypertension secondary to diastolic heart failure I asked her to take an adequate dose of furosemide 40 mg daily and then comfortable that she will clear her edema and improve.  She has arrangements to be seen by her PCP in the next month she can have renal function proBNP and a lipid profile reassessed at that time.  She is tolerating a low-dose of a low intensity statin well.  Her lower extremity arterial study shows evidence of mild PAD and she is not having claudication.  She will benefit from the same medical therapy for her CAD.  I told the patient that if she does not improve or worsen right and left heart catheterization would be appropriate she agrees and we made a shared decision plan Past Medical History:  Diagnosis Date  . GERD (gastroesophageal reflux disease)   . Hyperlipidemia   . Hypertension   . Pre-diabetes     Past Surgical History:  Procedure Laterality Date  . ABDOMINAL HYSTERECTOMY    . CATARACT EXTRACTION Left 03/2014  . CATARACT EXTRACTION Right 05/2014  . COLONOSCOPY  11/05/2013    Current Medications: Current Meds  Medication Sig  . aspirin EC 81 MG tablet Take 81 mg by mouth daily.  . bisoprolol (ZEBETA) 5 MG tablet Take 2.5 mg by mouth daily.  . furosemide (LASIX) 40 MG tablet Take 1 tablet  (40 mg total) by mouth daily.  Marland Kitchen omeprazole (PRILOSEC) 20 MG capsule Take 20 mg by mouth daily.   . pravastatin (PRAVACHOL) 20 MG tablet Take 1 tablet (20 mg total) by mouth 2 (two) times a week.  . triamcinolone cream (KENALOG) 0.5 % Apply 1 application topically daily as needed.  . [DISCONTINUED] furosemide (LASIX) 20 MG tablet Take 20 mg by mouth as needed.      Allergies:   Clarithromycin, Codeine, and Penicillins   Social History   Socioeconomic History  . Marital status: Married    Spouse name: Not on file  . Number of children: 1  . Years of education: Not on file  . Highest education level: Not on file  Occupational History  . Not on file  Social Needs  . Financial resource strain: Not on file  . Food insecurity    Worry: Not on file    Inability: Not on file  . Transportation needs    Medical: Not on file  Non-medical: Not on file  Tobacco Use  . Smoking status: Never Smoker  . Smokeless tobacco: Never Used  Substance and Sexual Activity  . Alcohol use: Never    Frequency: Never  . Drug use: Never  . Sexual activity: Not on file  Lifestyle  . Physical activity    Days per week: Not on file    Minutes per session: Not on file  . Stress: Not on file  Relationships  . Social Herbalist on phone: Not on file    Gets together: Not on file    Attends religious service: Not on file    Active member of club or organization: Not on file    Attends meetings of clubs or organizations: Not on file    Relationship status: Not on file  Other Topics Concern  . Not on file  Social History Narrative  . Not on file     Family History: The patient's family history includes Heart disease in her father. ROS:   Please see the history of present illness.    All other systems reviewed and are negative.  EKGs/Labs/Other Studies Reviewed:    The following studies were reviewed today:  11/02/2018: Cholesterol 202 HDL 55 LDL 129 potassium 4.2 creatinine 0.84  hemoglobin 13.2  Recent Labs: 08/28/2018: BUN 17; Creatinine, Ser 0.93; NT-Pro BNP 154; Potassium 4.4; Sodium 140  Recent Lipid Panel No results found for: CHOL, TRIG, HDL, CHOLHDL, VLDL, LDLCALC, LDLDIRECT  Physical Exam:    VS:  BP 130/78 (BP Location: Right Arm, Patient Position: Sitting, Cuff Size: Large)   Pulse (!) 57   Temp 98.6 F (37 C)   Ht 5\' 5"  (1.651 m)   Wt 235 lb 3.2 oz (106.7 kg)   SpO2 98%   BMI 39.14 kg/m     Wt Readings from Last 3 Encounters:  01/01/19 235 lb 3.2 oz (106.7 kg)  10/09/18 235 lb (106.6 kg)  08/28/18 234 lb 12.8 oz (106.5 kg)     GEN:  Well nourished, well developed in no acute distress HEENT: Normal NECK: No JVD; No carotid bruits LYMPHATICS: No lymphadenopathy CARDIAC: RRR, no murmurs, rubs, gallops RESPIRATORY:  Clear to auscultation without rales, wheezing or rhonchi  ABDOMEN: Soft, non-tender, non-distended MUSCULOSKELETAL: She has 4+ pitting ankle to knee bilateral edema; No deformity  SKIN: Warm and dry NEUROLOGIC:  Alert and oriented x 3 PSYCHIATRIC:  Normal affect    Signed, [name], MD  01/01/2019 3:15 PM    [facility]

## 2019-01-01 NOTE — Patient Instructions (Signed)
Medication Instructions:  Your physician has recommended you make the following change in your medication:  START isosorbide mononitrate (imdur) 30 mg: Take 1 tablet daily   INCREASE furosemide (lasix) 40 mg: Take 1 tablet daily   If you need a refill on your cardiac medications before your next appointment, please call your pharmacy.   Lab work: None  If you have labs (blood work) drawn today and your tests are completely normal, you will receive your results only by: Marland Kitchen MyChart Message (if you have MyChart) OR . A paper copy in the mail If you have any lab test that is abnormal or we need to change your treatment, we will call you to review the results.  Testing/Procedures: None  Follow-Up: At [facility], you and your health needs are our priority.  As part of our continuing mission to provide you with exceptional heart care, we have created designated Provider Care Teams.  These Care Teams include your primary Cardiologist (physician) and Advanced Practice Providers (APPs -  Physician Assistants and Nurse Practitioners) who all work together to provide you with the care you need, when you need it. You will need a follow up appointment in 3 months.      Isosorbide Mononitrate extended-release tablets What is this medicine? ISOSORBIDE MONONITRATE (eye soe SOR bide mon oh NYE trate) is a vasodilator. It relaxes blood vessels, increasing the blood and oxygen supply to your heart. This medicine is used to prevent chest pain caused by angina. It will not help to stop an episode of chest pain. This medicine may be used for other purposes; ask your health care provider or pharmacist if you have questions. COMMON BRAND NAME(S): Imdur, Isotrate ER What should I tell my health care provider before I take this medicine? They need to know if you have any of these conditions:  previous heart attack or heart failure  an unusual or allergic reaction to isosorbide mononitrate, nitrates,  other medicines, foods, dyes, or preservatives  pregnant or trying to get pregnant  breast-feeding How should I use this medicine? Take this medicine by mouth with a glass of water. Follow the directions on the prescription label. Do not crush or chew. Take your medicine at regular intervals. Do not take your medicine more often than directed. Do not stop taking this medicine except on the advice of your doctor or health care professional. Talk to your pediatrician regarding the use of this medicine in children. Special care may be needed. Overdosage: If you think you have taken too much of this medicine contact a poison control center or emergency room at once. NOTE: This medicine is only for you. Do not share this medicine with others. What if I miss a dose? If you miss a dose, take it as soon as you can. If it is almost time for your next dose, take only that dose. Do not take double or extra doses. What may interact with this medicine? Do not take this medicine with any of the following medications:  medicines used to treat erectile dysfunction (ED) like avanafil, sildenafil, tadalafil, and vardenafil  riociguat This medicine may also interact with the following medications:  medicines for high blood pressure  other medicines for angina or heart failure This list may not describe all possible interactions. Give your health care provider a list of all the medicines, herbs, non-prescription drugs, or dietary supplements you use. Also tell them if you smoke, drink alcohol, or use illegal drugs. Some items may interact with  your medicine. What should I watch for while using this medicine? Check your heart rate and blood pressure regularly while you are taking this medicine. Ask your doctor or health care professional what your heart rate and blood pressure should be and when you should contact him or her. Tell your doctor or health care professional if you feel your medicine is no longer  working. You may get dizzy. Do not drive, use machinery, or do anything that needs mental alertness until you know how this medicine affects you. To reduce the risk of dizzy or fainting spells, do not sit or stand up quickly, especially if you are an older patient. Alcohol can make you more dizzy, and increase flushing and rapid heartbeats. Avoid alcoholic drinks. Do not treat yourself for coughs, colds, or pain while you are taking this medicine without asking your doctor or health care professional for advice. Some ingredients may increase your blood pressure. What side effects may I notice from receiving this medicine? Side effects that you should report to your doctor or health care professional as soon as possible:  bluish discoloration of lips, fingernails, or palms of hands  irregular heartbeat, palpitations  low blood pressure  nausea, vomiting  persistent headache  unusually weak or tired Side effects that usually do not require medical attention (report to your doctor or health care professional if they continue or are bothersome):  flushing of the face or neck  rash This list may not describe all possible side effects. Call your doctor for medical advice about side effects. You may report side effects to FDA at 1-800-FDA-1088. Where should I keep my medicine? Keep out of the reach of children. Store between 15 and 30 degrees C (59 and 86 degrees F). Keep container tightly closed. Throw away any unused medicine after the expiration date. NOTE: This sheet is a summary. It may not cover all possible information. If you have questions about this medicine, talk to your doctor, pharmacist, or health care provider.  2020 Elsevier/Gold Standard (2013-04-09 14:48:19)

## 2019-01-12 DIAGNOSIS — Z1231 Encounter for screening mammogram for malignant neoplasm of breast: Secondary | ICD-10-CM | POA: Diagnosis not present

## 2019-02-03 DIAGNOSIS — R7301 Impaired fasting glucose: Secondary | ICD-10-CM | POA: Diagnosis not present

## 2019-02-03 DIAGNOSIS — I10 Essential (primary) hypertension: Secondary | ICD-10-CM | POA: Diagnosis not present

## 2019-02-03 DIAGNOSIS — K219 Gastro-esophageal reflux disease without esophagitis: Secondary | ICD-10-CM | POA: Diagnosis not present

## 2019-02-03 DIAGNOSIS — Z23 Encounter for immunization: Secondary | ICD-10-CM | POA: Diagnosis not present

## 2019-02-03 DIAGNOSIS — E782 Mixed hyperlipidemia: Secondary | ICD-10-CM | POA: Diagnosis not present

## 2019-03-29 DIAGNOSIS — L821 Other seborrheic keratosis: Secondary | ICD-10-CM | POA: Diagnosis not present

## 2019-03-29 DIAGNOSIS — L578 Other skin changes due to chronic exposure to nonionizing radiation: Secondary | ICD-10-CM | POA: Diagnosis not present

## 2019-03-29 DIAGNOSIS — L309 Dermatitis, unspecified: Secondary | ICD-10-CM | POA: Diagnosis not present

## 2019-04-05 ENCOUNTER — Ambulatory Visit: Payer: Medicare Other | Admitting: Cardiology

## 2019-04-20 DIAGNOSIS — H3321 Serous retinal detachment, right eye: Secondary | ICD-10-CM | POA: Diagnosis not present

## 2019-04-20 DIAGNOSIS — H04123 Dry eye syndrome of bilateral lacrimal glands: Secondary | ICD-10-CM | POA: Diagnosis not present

## 2019-04-20 DIAGNOSIS — Z961 Presence of intraocular lens: Secondary | ICD-10-CM | POA: Diagnosis not present

## 2019-04-20 DIAGNOSIS — H353121 Nonexudative age-related macular degeneration, left eye, early dry stage: Secondary | ICD-10-CM | POA: Diagnosis not present

## 2019-04-20 DIAGNOSIS — H353211 Exudative age-related macular degeneration, right eye, with active choroidal neovascularization: Secondary | ICD-10-CM | POA: Diagnosis not present

## 2019-04-20 DIAGNOSIS — H43813 Vitreous degeneration, bilateral: Secondary | ICD-10-CM | POA: Diagnosis not present

## 2019-04-20 NOTE — Progress Notes (Signed)
Cardiology Office Note:    Date:  04/22/2019   ID:  Miranda Moses, DOB 12/06/1940, MRN [id number]  PCP:  Miranda Jump, PA-C  Cardiologist:  Miranda More, MD    Referring MD: Miranda Jump, PA-C    ASSESSMENT:    1. Coronary artery disease of native artery of native heart with stable angina pectoris ([place])   2. Hypertensive heart disease with heart failure ([place])   3. Mixed dyslipidemia   4. PAH (pulmonary artery hypertension) ([place])    PLAN:    In order of problems listed above:  1. Stable having no anginal discomfort she does not have severe flow-limiting stenosis of cardiac CTA and FFR and continue her medical therapy including aspirin beta-blocker and lipid-lowering therapy.  She had progression of symptoms should be appropriate for cardiac catheterization and revascularization. 2. Stable BP at target continue diuretic beta-blocker 3. Very poorly statin intolerant recheck her profile likely needs a second agent like Zetia and if ineffective or intolerant PCSK9 treatment but I keep her on a background a minimum dose of low intensity statin 4. Stable, she has no indication of severe pulmonary hypertension clinically   Next appointment: 6 months   Medication Adjustments/Labs and Tests Ordered: Current medicines are reviewed at length with the patient today.  Concerns regarding medicines are outlined above.  No orders of the defined types were placed in this encounter.  No orders of the defined types were placed in this encounter.   Chief Complaint  Patient presents with  . Follow-up  . Coronary Artery Disease  . Hypertension  . Hyperlipidemia    History of Present Illness:    Miranda Moses is a 78 y.o. female with a hx of  chest pain SOB  hypertension dyslipidemia  last seen 01/01/2019.She has been seen in the past for exertional chest pain sounds like typical angina advised myocardial perfusion study she also has shortness of breath edema and echocardiogram that imply  pulmonary artery hypertension in 2019. She is told she has peripheral arterial disease but does not have claudication she has CT of the abdomen and pelvis that showed atherosclerosis but no stenosis and then tells me that she had vascular studies done at [facility] and inquired of the results.  Despite echo findings of elevated left atrial pressure elevated pulmonary artery systolic pressure her proBNP level was low not in range consistent with heart failure or clinical pulmonary hypertension.    I reviewed her testing with her including:   Echocardiogram performed 10/14/2018 showed ejection fraction 60 to 65% normal right ventricular size and function moderate left atrial enlargement and pulmonary hypertension with a estimated peak pulmonary systolic pressure 65 mmHg.  Left ventricular diastolic dysfunction was seen with pseudonormalization consistent with diastolic heart failure.   Lower extremity ABI was normal in the right lower extremity and mildly reduced in the left lower extremity.   Cardiac CTA 11/06/2018 showed the pulmonary arteries being normal size.  The left main coronary artery mild 25 to 50% stenosis.  The left anterior descending coronary artery proximally had a 50 to 69% stenosis with normal FFR in the mid and distal vessel had diffuse disease.  Left circumflex coronary artery had moderate proximal 50 to 69% stenosis in the right coronary artery had a severe calcific plaque in the ostial right coronary artery extending into the proximal portion with stenosis greater than 70%.  The study was sent for [facility] which showed diminished flow in the distal left circumflex only.    Compliance with  diet, lifestyle and medications: Yes, she tried taking pravastatin daily but had to stop with muscle weakness and muscle pain.  Overall she is doing well but she finds herself very fatigued dry and tearful over COVID-19.  She practices all the good precautionary measures.  Presently no edema  shortness of breath chest pain palpitation or syncope and has a bottle of nitroglycerin at home if needed.  She had a lipid profile a few weeks after starting twice weekly pravastatin with a cholesterol 233 LDL 150 HDL 55 and we discussed the measures to achieve target.  I will recheck a profile today if needed will try Zetia and if ineffective she agrees to PCSK9 therapy for him to defer at this time because of the potential financial toxicity.  Her CAD is stable and she is having no anginal discomfort repeat blood pressure by me is at target. Past Medical History:  Diagnosis Date  . GERD (gastroesophageal reflux disease)   . Hyperlipidemia   . Hypertension   . Pre-diabetes     Past Surgical History:  Procedure Laterality Date  . ABDOMINAL HYSTERECTOMY    . CATARACT EXTRACTION Left 03/2014  . CATARACT EXTRACTION Right 05/2014  . COLONOSCOPY  11/05/2013    Current Medications: Current Meds  Medication Sig  . aspirin EC 81 MG tablet Take 81 mg by mouth daily.  . bisoprolol (ZEBETA) 5 MG tablet Take 2.5 mg by mouth daily.  . furosemide (LASIX) 40 MG tablet Take 1 tablet (40 mg total) by mouth daily.  . isosorbide mononitrate (IMDUR) 30 MG 24 hr tablet Take 1 tablet (30 mg total) by mouth daily.  Marland Kitchen omeprazole (PRILOSEC) 20 MG capsule Take 20 mg by mouth daily.   . pravastatin (PRAVACHOL) 20 MG tablet Take 1 tablet (20 mg total) by mouth 2 (two) times a week.  . tobramycin (TOBREX) 0.3 % ophthalmic solution   . triamcinolone cream (KENALOG) 0.5 % Apply 1 application topically daily as needed.     Allergies:   Clarithromycin, Codeine, and Penicillins   Social History   Socioeconomic History  . Marital status: Married    Spouse name: Not on file  . Number of children: 1  . Years of education: Not on file  . Highest education level: Not on file  Occupational History  . Not on file  Social Needs  . Financial resource strain: Not on file  . Food insecurity    Worry: Not on file     Inability: Not on file  . Transportation needs    Medical: Not on file    Non-medical: Not on file  Tobacco Use  . Smoking status: Never Smoker  . Smokeless tobacco: Never Used  Substance and Sexual Activity  . Alcohol use: Never    Frequency: Never  . Drug use: Never  . Sexual activity: Not on file  Lifestyle  . Physical activity    Days per week: Not on file    Minutes per session: Not on file  . Stress: Not on file  Relationships  . Social Herbalist on phone: Not on file    Gets together: Not on file    Attends religious service: Not on file    Active member of club or organization: Not on file    Attends meetings of clubs or organizations: Not on file    Relationship status: Not on file  Other Topics Concern  . Not on file  Social History Narrative  . Not on  file     Family History: The patient's family history includes Heart disease in her father. ROS:   Please see the history of present illness.    All other systems reviewed and are negative.  EKGs/Labs/Other Studies Reviewed:    The following studies were reviewed today:    Recent Labs: 08/28/2018: BUN 17; Creatinine, Ser 0.93; NT-Pro BNP 154; Potassium 4.4; Sodium 140  Recent Lipid Panel No results found for: CHOL, TRIG, HDL, CHOLHDL, VLDL, LDLCALC, LDLDIRECT  Physical Exam:    VS:  BP (!) 160/72 (BP Location: Right Arm, Patient Position: Sitting, Cuff Size: Normal)   Pulse (!) 58   Ht 5\' 5"  (1.651 m)   Wt 237 lb (107.5 kg)   SpO2 98%   BMI 39.44 kg/m     Wt Readings from Last 3 Encounters:  04/22/19 237 lb (107.5 kg)  01/01/19 235 lb 3.2 oz (106.7 kg)  10/09/18 235 lb (106.6 kg)     GEN:  Well nourished, well developed in no acute distress HEENT: Normal NECK: No JVD; No carotid bruits LYMPHATICS: No lymphadenopathy CARDIAC: RRR, no murmurs, rubs, gallops RESPIRATORY:  Clear to auscultation without rales, wheezing or rhonchi  ABDOMEN: Soft, non-tender, non-distended  MUSCULOSKELETAL:  No edema; No deformity  SKIN: Warm and dry NEUROLOGIC:  Alert and oriented x 3 PSYCHIATRIC:  Normal affect    Signed, Miranda More, MD  04/22/2019 8:56 AM    [place]

## 2019-04-22 ENCOUNTER — Encounter: Payer: Self-pay | Admitting: Cardiology

## 2019-04-22 ENCOUNTER — Other Ambulatory Visit: Payer: Self-pay

## 2019-04-22 ENCOUNTER — Ambulatory Visit (INDEPENDENT_AMBULATORY_CARE_PROVIDER_SITE_OTHER): Payer: Medicare Other | Admitting: Cardiology

## 2019-04-22 VITALS — BP 140/66 | HR 58 | Ht 65.0 in | Wt 237.0 lb

## 2019-04-22 DIAGNOSIS — I2721 Secondary pulmonary arterial hypertension: Secondary | ICD-10-CM

## 2019-04-22 DIAGNOSIS — E782 Mixed hyperlipidemia: Secondary | ICD-10-CM | POA: Diagnosis not present

## 2019-04-22 DIAGNOSIS — I25118 Atherosclerotic heart disease of native coronary artery with other forms of angina pectoris: Secondary | ICD-10-CM | POA: Diagnosis not present

## 2019-04-22 DIAGNOSIS — I110 Hypertensive heart disease with heart failure: Secondary | ICD-10-CM

## 2019-04-22 LAB — COMPREHENSIVE METABOLIC PANEL
ALT: 11 IU/L (ref 0–32)
AST: 17 IU/L (ref 0–40)
Albumin/Globulin Ratio: 1.4 (ref 1.2–2.2)
Albumin: 4 g/dL (ref 3.7–4.7)
Alkaline Phosphatase: 99 IU/L (ref 39–117)
BUN/Creatinine Ratio: 16 (ref 12–28)
BUN: 15 mg/dL (ref 8–27)
Bilirubin Total: 0.8 mg/dL (ref 0.0–1.2)
CO2: 22 mmol/L (ref 20–29)
Calcium: 9.5 mg/dL (ref 8.7–10.3)
Chloride: 104 mmol/L (ref 96–106)
Creatinine, Ser: 0.91 mg/dL (ref 0.57–1.00)
GFR calc Af Amer: 71 mL/min/{1.73_m2} (ref >59)
GFR calc non Af Amer: 61 mL/min/{1.73_m2} (ref >59)
Globulin, Total: 2.8 g/dL (ref 1.5–4.5)
Glucose: 93 mg/dL (ref 65–99)
Potassium: 4.4 mmol/L (ref 3.5–5.2)
Sodium: 140 mmol/L (ref 134–144)
Total Protein: 6.8 g/dL (ref 6.0–8.5)

## 2019-04-22 LAB — LIPID PANEL
Chol/HDL Ratio: 3.5 ratio (ref 0.0–4.4)
Cholesterol, Total: 216 mg/dL — ABNORMAL HIGH (ref 100–199)
HDL: 61 mg/dL (ref >39)
LDL Chol Calc (NIH): 135 mg/dL — ABNORMAL HIGH (ref 0–99)
Triglycerides: 115 mg/dL (ref 0–149)
VLDL Cholesterol Cal: 20 mg/dL (ref 5–40)

## 2019-04-22 NOTE — Patient Instructions (Addendum)
Medication Instructions:  Your physician recommends that you continue on your current medications as directed. Please refer to the Current Medication list given to you today.  *If you need a refill on your cardiac medications before your next appointment, please call your pharmacy*  Lab Work: Your physician recommends that you return for lab work today: CMP, lipid panel.   If you have labs (blood work) drawn today and your tests are completely normal, you will receive your results only by: Marland Kitchen MyChart Message (if you have MyChart) OR . A paper copy in the mail If you have any lab test that is abnormal or we need to change your treatment, we will call you to review the results.  Testing/Procedures: None  Follow-Up: At [facility], you and your health needs are our priority.  As part of our continuing mission to provide you with exceptional heart care, we have created designated Provider Care Teams.  These Care Teams include your primary Cardiologist (physician) and Advanced Practice Providers (APPs -  Physician Assistants and Nurse Practitioners) who all work together to provide you with the care you need, when you need it.  Your next appointment:   6 months  The format for your next appointment:   In Person  Provider:   [name], MD  **If you need to go to the Emergency Room, please go to the [facility] Point: [place], [place].

## 2019-05-07 DIAGNOSIS — R7301 Impaired fasting glucose: Secondary | ICD-10-CM | POA: Diagnosis not present

## 2019-05-07 DIAGNOSIS — Z23 Encounter for immunization: Secondary | ICD-10-CM | POA: Diagnosis not present

## 2019-05-07 DIAGNOSIS — I10 Essential (primary) hypertension: Secondary | ICD-10-CM | POA: Diagnosis not present

## 2019-05-07 DIAGNOSIS — E782 Mixed hyperlipidemia: Secondary | ICD-10-CM | POA: Diagnosis not present

## 2019-05-07 DIAGNOSIS — K219 Gastro-esophageal reflux disease without esophagitis: Secondary | ICD-10-CM | POA: Diagnosis not present

## 2019-05-18 DIAGNOSIS — H353121 Nonexudative age-related macular degeneration, left eye, early dry stage: Secondary | ICD-10-CM | POA: Diagnosis not present

## 2019-05-18 DIAGNOSIS — H43813 Vitreous degeneration, bilateral: Secondary | ICD-10-CM | POA: Diagnosis not present

## 2019-05-18 DIAGNOSIS — H353211 Exudative age-related macular degeneration, right eye, with active choroidal neovascularization: Secondary | ICD-10-CM | POA: Diagnosis not present

## 2019-05-25 ENCOUNTER — Other Ambulatory Visit: Payer: Self-pay | Admitting: Cardiology

## 2019-07-09 DIAGNOSIS — Z20828 Contact with and (suspected) exposure to other viral communicable diseases: Secondary | ICD-10-CM | POA: Diagnosis not present

## 2019-07-09 DIAGNOSIS — R509 Fever, unspecified: Secondary | ICD-10-CM | POA: Diagnosis not present

## 2019-07-09 DIAGNOSIS — R05 Cough: Secondary | ICD-10-CM | POA: Diagnosis not present

## 2019-07-22 DIAGNOSIS — U071 COVID-19: Secondary | ICD-10-CM | POA: Diagnosis not present

## 2019-07-22 DIAGNOSIS — I4891 Unspecified atrial fibrillation: Secondary | ICD-10-CM | POA: Diagnosis not present

## 2019-07-22 DIAGNOSIS — R197 Diarrhea, unspecified: Secondary | ICD-10-CM | POA: Diagnosis not present

## 2019-07-22 DIAGNOSIS — Z79899 Other long term (current) drug therapy: Secondary | ICD-10-CM | POA: Diagnosis not present

## 2019-07-22 DIAGNOSIS — R531 Weakness: Secondary | ICD-10-CM | POA: Diagnosis not present

## 2019-07-22 DIAGNOSIS — R0602 Shortness of breath: Secondary | ICD-10-CM | POA: Diagnosis not present

## 2019-07-22 DIAGNOSIS — K219 Gastro-esophageal reflux disease without esophagitis: Secondary | ICD-10-CM | POA: Diagnosis not present

## 2019-07-22 DIAGNOSIS — R0902 Hypoxemia: Secondary | ICD-10-CM | POA: Diagnosis not present

## 2019-07-22 DIAGNOSIS — E860 Dehydration: Secondary | ICD-10-CM | POA: Diagnosis not present

## 2019-07-22 DIAGNOSIS — I2609 Other pulmonary embolism with acute cor pulmonale: Secondary | ICD-10-CM | POA: Diagnosis not present

## 2019-07-22 DIAGNOSIS — Z7982 Long term (current) use of aspirin: Secondary | ICD-10-CM | POA: Diagnosis not present

## 2019-07-23 ENCOUNTER — Inpatient Hospital Stay (HOSPITAL_COMMUNITY)
Admission: AD | Admit: 2019-07-23 | Discharge: 2019-07-30 | DRG: 177 | Disposition: A | Discharge status: Home Health | Payer: Medicare Other | Source: Other Acute Inpatient Hospital | Attending: Internal Medicine | Admitting: Internal Medicine

## 2019-07-23 ENCOUNTER — Other Ambulatory Visit: Payer: Self-pay

## 2019-07-23 ENCOUNTER — Inpatient Hospital Stay (HOSPITAL_COMMUNITY): Payer: Medicare Other

## 2019-07-23 ENCOUNTER — Encounter (HOSPITAL_COMMUNITY): Payer: Self-pay | Admitting: Family Medicine

## 2019-07-23 DIAGNOSIS — Z90710 Acquired absence of both cervix and uterus: Secondary | ICD-10-CM

## 2019-07-23 DIAGNOSIS — R7303 Prediabetes: Secondary | ICD-10-CM | POA: Diagnosis present

## 2019-07-23 DIAGNOSIS — Z7982 Long term (current) use of aspirin: Secondary | ICD-10-CM | POA: Diagnosis not present

## 2019-07-23 DIAGNOSIS — I4891 Unspecified atrial fibrillation: Secondary | ICD-10-CM | POA: Diagnosis present

## 2019-07-23 DIAGNOSIS — E785 Hyperlipidemia, unspecified: Secondary | ICD-10-CM | POA: Diagnosis present

## 2019-07-23 DIAGNOSIS — U071 COVID-19: Principal | ICD-10-CM | POA: Diagnosis present

## 2019-07-23 DIAGNOSIS — J1282 Pneumonia due to coronavirus disease 2019: Secondary | ICD-10-CM | POA: Diagnosis present

## 2019-07-23 DIAGNOSIS — Z8249 Family history of ischemic heart disease and other diseases of the circulatory system: Secondary | ICD-10-CM | POA: Diagnosis not present

## 2019-07-23 DIAGNOSIS — R0902 Hypoxemia: Secondary | ICD-10-CM | POA: Diagnosis present

## 2019-07-23 DIAGNOSIS — I088 Other rheumatic multiple valve diseases: Secondary | ICD-10-CM | POA: Diagnosis present

## 2019-07-23 DIAGNOSIS — I2510 Atherosclerotic heart disease of native coronary artery without angina pectoris: Secondary | ICD-10-CM | POA: Diagnosis present

## 2019-07-23 DIAGNOSIS — Z6841 Body Mass Index (BMI) 40.0 and over, adult: Secondary | ICD-10-CM

## 2019-07-23 DIAGNOSIS — Z885 Allergy status to narcotic agent status: Secondary | ICD-10-CM | POA: Diagnosis not present

## 2019-07-23 DIAGNOSIS — I2602 Saddle embolus of pulmonary artery with acute cor pulmonale: Secondary | ICD-10-CM

## 2019-07-23 DIAGNOSIS — I2609 Other pulmonary embolism with acute cor pulmonale: Secondary | ICD-10-CM

## 2019-07-23 DIAGNOSIS — Z79899 Other long term (current) drug therapy: Secondary | ICD-10-CM

## 2019-07-23 DIAGNOSIS — I110 Hypertensive heart disease with heart failure: Secondary | ICD-10-CM | POA: Diagnosis present

## 2019-07-23 DIAGNOSIS — I2721 Secondary pulmonary arterial hypertension: Secondary | ICD-10-CM | POA: Diagnosis present

## 2019-07-23 DIAGNOSIS — Z9842 Cataract extraction status, left eye: Secondary | ICD-10-CM | POA: Diagnosis not present

## 2019-07-23 DIAGNOSIS — I4819 Other persistent atrial fibrillation: Secondary | ICD-10-CM | POA: Diagnosis present

## 2019-07-23 DIAGNOSIS — Z9841 Cataract extraction status, right eye: Secondary | ICD-10-CM | POA: Diagnosis not present

## 2019-07-23 DIAGNOSIS — I5032 Chronic diastolic (congestive) heart failure: Secondary | ICD-10-CM | POA: Diagnosis present

## 2019-07-23 DIAGNOSIS — K219 Gastro-esophageal reflux disease without esophagitis: Secondary | ICD-10-CM | POA: Diagnosis present

## 2019-07-23 DIAGNOSIS — Z881 Allergy status to other antibiotic agents status: Secondary | ICD-10-CM

## 2019-07-23 DIAGNOSIS — R0600 Dyspnea, unspecified: Secondary | ICD-10-CM

## 2019-07-23 DIAGNOSIS — I2699 Other pulmonary embolism without acute cor pulmonale: Secondary | ICD-10-CM | POA: Diagnosis present

## 2019-07-23 DIAGNOSIS — Z880 Allergy status to penicillin: Secondary | ICD-10-CM | POA: Diagnosis not present

## 2019-07-23 DIAGNOSIS — I10 Essential (primary) hypertension: Secondary | ICD-10-CM | POA: Diagnosis not present

## 2019-07-23 HISTORY — DX: Chronic diastolic (congestive) heart failure: I50.32

## 2019-07-23 HISTORY — DX: Atherosclerotic heart disease of native coronary artery without angina pectoris: I25.10

## 2019-07-23 HISTORY — DX: Pulmonary hypertension, unspecified: I27.20

## 2019-07-23 LAB — ECHOCARDIOGRAM LIMITED
Height: 65 in
Weight: 3987.2 oz

## 2019-07-23 LAB — CBC WITH DIFFERENTIAL/PLATELET
Abs Immature Granulocytes: 0.02 10*3/uL (ref 0.00–0.07)
Basophils Absolute: 0 10*3/uL (ref 0.0–0.1)
Basophils Relative: 0 %
Eosinophils Absolute: 0 10*3/uL (ref 0.0–0.5)
Eosinophils Relative: 1 %
HCT: 36.3 % (ref 36.0–46.0)
Hemoglobin: 11.6 g/dL — ABNORMAL LOW (ref 12.0–15.0)
Immature Granulocytes: 0 %
Lymphocytes Relative: 15 %
Lymphs Abs: 0.8 10*3/uL (ref 0.7–4.0)
MCH: 27.6 pg (ref 26.0–34.0)
MCHC: 32 g/dL (ref 30.0–36.0)
MCV: 86.2 fL (ref 80.0–100.0)
Monocytes Absolute: 0.5 10*3/uL (ref 0.1–1.0)
Monocytes Relative: 10 %
Neutro Abs: 3.9 10*3/uL (ref 1.7–7.7)
Neutrophils Relative %: 74 %
Platelets: 136 10*3/uL — ABNORMAL LOW (ref 150–400)
RBC: 4.21 MIL/uL (ref 3.87–5.11)
RDW: 14.5 % (ref 11.5–15.5)
WBC: 5.3 10*3/uL (ref 4.0–10.5)
nRBC: 0 % (ref 0.0–0.2)

## 2019-07-23 LAB — COMPREHENSIVE METABOLIC PANEL
ALT: 22 U/L (ref 0–44)
AST: 32 U/L (ref 15–41)
Albumin: 2.9 g/dL — ABNORMAL LOW (ref 3.5–5.0)
Alkaline Phosphatase: 62 U/L (ref 38–126)
Anion gap: 13 (ref 5–15)
BUN: 24 mg/dL — ABNORMAL HIGH (ref 8–23)
CO2: 18 mmol/L — ABNORMAL LOW (ref 22–32)
Calcium: 8 mg/dL — ABNORMAL LOW (ref 8.9–10.3)
Chloride: 104 mmol/L (ref 98–111)
Creatinine, Ser: 0.84 mg/dL (ref 0.44–1.00)
GFR calc Af Amer: >60 mL/min (ref >60)
GFR calc non Af Amer: >60 mL/min (ref >60)
Glucose, Bld: 107 mg/dL — ABNORMAL HIGH (ref 70–99)
Potassium: 3.7 mmol/L (ref 3.5–5.1)
Sodium: 135 mmol/L (ref 135–145)
Total Bilirubin: 0.8 mg/dL (ref 0.3–1.2)
Total Protein: 6.3 g/dL — ABNORMAL LOW (ref 6.5–8.1)

## 2019-07-23 LAB — HEPARIN LEVEL (UNFRACTIONATED)
Heparin Unfractionated: >2.2 IU/mL — ABNORMAL HIGH (ref 0.30–0.70)
Heparin Unfractionated: 0.83 IU/mL — ABNORMAL HIGH (ref 0.30–0.70)
Heparin Unfractionated: 0.7 IU/mL (ref 0.30–0.70)

## 2019-07-23 LAB — C-REACTIVE PROTEIN: CRP: 4.7 mg/dL — ABNORMAL HIGH (ref <1.0)

## 2019-07-23 LAB — MAGNESIUM: Magnesium: 1.9 mg/dL (ref 1.7–2.4)

## 2019-07-23 LAB — FERRITIN: Ferritin: 138 ng/mL (ref 11–307)

## 2019-07-23 LAB — TSH: TSH: 0.819 u[IU]/mL (ref 0.350–4.500)

## 2019-07-23 LAB — D-DIMER, QUANTITATIVE: D-Dimer, Quant: 17.1 ug/mL-FEU — ABNORMAL HIGH (ref 0.00–0.50)

## 2019-07-23 LAB — PROCALCITONIN: Procalcitonin: <0.1 ng/mL

## 2019-07-23 LAB — ABO/RH: ABO/RH(D): O POS

## 2019-07-23 MED ORDER — PANTOPRAZOLE SODIUM 40 MG PO TBEC
40.0000 mg | DELAYED_RELEASE_TABLET | Freq: Every day | ORAL | Status: DC
  Administered 2019-07-23 – 2019-07-30 (×8): 40 mg via ORAL
  Filled 2019-07-23 (×8): qty 1

## 2019-07-23 MED ORDER — SODIUM CHLORIDE 0.9% FLUSH
3.0000 mL | Freq: Two times a day (BID) | INTRAVENOUS | Status: DC
  Administered 2019-07-23 – 2019-07-30 (×14): 3 mL via INTRAVENOUS

## 2019-07-23 MED ORDER — HYDROCODONE-ACETAMINOPHEN 5-325 MG PO TABS: 1.0000 | ORAL_TABLET | ORAL | Status: DC | PRN

## 2019-07-23 MED ORDER — HEPARIN (PORCINE) 25000 UT/250ML-% IV SOLN
1250.0000 [IU]/h | INTRAVENOUS | Status: DC
  Administered 2019-07-23: 1200 [IU]/h via INTRAVENOUS
  Administered 2019-07-23 – 2019-07-24 (×2): 1050 [IU]/h via INTRAVENOUS
  Administered 2019-07-25: 1100 [IU]/h via INTRAVENOUS
  Filled 2019-07-23 (×4): qty 250

## 2019-07-23 MED ORDER — ACETAMINOPHEN 325 MG PO TABS
650.0000 mg | ORAL_TABLET | Freq: Four times a day (QID) | ORAL | Status: DC | PRN
  Administered 2019-07-25: 650 mg via ORAL
  Filled 2019-07-23: qty 2

## 2019-07-23 MED ORDER — DILTIAZEM HCL-DEXTROSE 125-5 MG/125ML-% IV SOLN (PREMIX)
5.0000 mg/h | INTRAVENOUS | Status: DC
  Administered 2019-07-23: 7.5 mg/h via INTRAVENOUS
  Administered 2019-07-23: 5 mg/h via INTRAVENOUS
  Administered 2019-07-24: 10 mg/h via INTRAVENOUS
  Administered 2019-07-24: 7.5 mg/h via INTRAVENOUS
  Filled 2019-07-23 (×3): qty 125

## 2019-07-23 MED ORDER — ONDANSETRON HCL 4 MG PO TABS: 4.0000 mg | ORAL_TABLET | Freq: Four times a day (QID) | ORAL | Status: DC | PRN

## 2019-07-23 MED ORDER — ONDANSETRON HCL 4 MG/2ML IJ SOLN: 4.0000 mg | Freq: Four times a day (QID) | INTRAMUSCULAR | Status: DC | PRN

## 2019-07-23 MED ORDER — SODIUM CHLORIDE 0.9 % IV SOLN: 250.0000 mL | INTRAVENOUS | Status: DC | PRN

## 2019-07-23 MED ORDER — SODIUM CHLORIDE 0.9 % IV SOLN
100.0000 mg | Freq: Every day | INTRAVENOUS | Status: AC
  Administered 2019-07-24 – 2019-07-27 (×4): 100 mg via INTRAVENOUS
  Filled 2019-07-23 (×4): qty 20

## 2019-07-23 MED ORDER — MORPHINE SULFATE (PF) 2 MG/ML IV SOLN: 3.0000 mg | INTRAVENOUS | Status: DC | PRN

## 2019-07-23 MED ORDER — SENNOSIDES-DOCUSATE SODIUM 8.6-50 MG PO TABS: 1.0000 | ORAL_TABLET | Freq: Every evening | ORAL | Status: DC | PRN

## 2019-07-23 MED ORDER — SALINE SPRAY 0.65 % NA SOLN
1.0000 | NASAL | Status: DC | PRN
  Filled 2019-07-23: qty 44

## 2019-07-23 MED ORDER — SODIUM CHLORIDE 0.9% FLUSH: 3.0000 mL | INTRAVENOUS | Status: DC | PRN

## 2019-07-23 MED ORDER — ASPIRIN EC 81 MG PO TBEC
81.0000 mg | DELAYED_RELEASE_TABLET | Freq: Every day | ORAL | Status: DC
  Administered 2019-07-23 – 2019-07-30 (×8): 81 mg via ORAL
  Filled 2019-07-23 (×8): qty 1

## 2019-07-23 MED ORDER — SODIUM CHLORIDE 0.9 % IV SOLN
200.0000 mg | Freq: Once | INTRAVENOUS | Status: AC
  Administered 2019-07-23: 200 mg via INTRAVENOUS
  Filled 2019-07-23: qty 40

## 2019-07-23 MED ORDER — SODIUM CHLORIDE 0.9% FLUSH
3.0000 mL | Freq: Two times a day (BID) | INTRAVENOUS | Status: DC
  Administered 2019-07-23 – 2019-07-30 (×14): 3 mL via INTRAVENOUS

## 2019-07-23 NOTE — Plan of Care (Signed)
  Problem: Education: Goal: Knowledge of risk factors and measures for prevention of condition will improve Outcome: Progressing   Problem: Coping: Goal: Psychosocial and spiritual needs will be supported Outcome: Progressing   Problem: Respiratory: Goal: Will maintain a patent airway Outcome: Progressing Goal: Complications related to the disease process, condition or treatment will be avoided or minimized Outcome: Progressing   

## 2019-07-23 NOTE — Progress Notes (Signed)
ANTICOAGULATION CONSULT NOTE - Initial Consult  Pharmacy Consult for heparin infusion Indication: pulmonary embolus  Allergies  Allergen Reactions  . Clarithromycin     Numbness of extremities  . Codeine Other (See Comments)    Caused pain  . Penicillins Rash    Patient Measurements: Height: 5\' 5"  (165.1 cm) Weight: 249 lb 3.2 oz (113 kg) IBW/kg (Calculated) : 57 Heparin Dosing Weight: HEPARIN DW (KG): 83.8  Vital Signs: Temp: 98.4 F (36.9 C) (01/29 0618) Temp Source: Oral (01/29 0618) Pulse Rate: 74 (01/29 0618)  Labs: No results for input(s): HGB, HCT, PLT, APTT, LABPROT, INR, HEPARINUNFRC, HEPRLOWMOCWT, CREATININE, CKTOTAL, CKMB, TROPONINIHS in the last 72 hours.  CrCl cannot be calculated (Patient's most recent lab result is older than the maximum 21 days allowed.).   Medical History: Past Medical History:  Diagnosis Date  . GERD (gastroesophageal reflux disease)   . Hyperlipidemia   . Hypertension   . Pre-diabetes     Assessment: Pharmacy consulted to dose heparin infusion for this 79 yo female with acute PE and right heart strain. She was started on heparin infusion at [facility] with a bolus of 5000 units and a rate of 1200 units/hr.  Baseline aPTT was WNL, but first 6-hr aPTT  was drawn  incorrectly and had to be re-drawn (result not available before transfer to [facility]).  She wasn't on any anti-coagulalants prior to admission.   Labs from [facility] [place]: Plates: 140K Hb: 12.7 mg/dL D-Dimer: 7925 ng/mL-->converts to 15.38mcg/mL-FEU Baseline aPTT: 24.1 seconds SCr 1.0mg /dL LDH: 590 IU/L   Goal of Therapy:  Heparin level 0.3-0.7 units/ml Monitor platelets by anticoagulation protocol: Yes   Plan:  Stat heparin level (to assess previous 6+ hours of therapy) Continue heparin infusion at 1200 units/hr Check anti-Xa level in 6-8 hours and daily while on heparin Monitor CBC and signs/symptoms of bleeding.   Despina Pole 07/23/2019,6:25 AM

## 2019-07-23 NOTE — Progress Notes (Addendum)
PROGRESS NOTE                                                                                                                                                                                                             Patient Demographics:    Miranda Moses, is a 79 y.o. female, DOB - [birth year], [id number]  Admit date - 07/23/2019   Admitting Physician [name], MD  Outpatient Primary MD for the patient is [name], PA-C  LOS - 0   No chief complaint on file.      Brief Narrative     This is a no charge Note, as patient was seen and admitted earlier by Dr. Myna Hidalgo, chart, imaging and labs were reviewed  HPI: Miranda Moses is a 79 y.o. female with medical history significant for hypertension, prediabetes, pulmonary arterial hypertension, chronic diastolic CHF, coronary artery disease, and COVID-19 diagnosed on 07/09/2019, now presenting to the emergency department for evaluation of fatigue, lightheadedness, and malaise.  Patient reports that she developed a cough just over 2 weeks ago, was tested for COVID-19 the following day (07/09/2019), was positive, and has gone on to have progressive malaise and fatigue.  She suspected that the lightheadedness was due to dehydration so she stopped taking her Lasix and increased her fluid intake.  She continued to feel poorly and went to the emergency department last night.  She has not had any chest pain or palpitations associated with this.  Her chronic bilateral leg swelling has worsened over the past few days.  She has not noted any leg tenderness though.  Denies hemoptysis.  [facility] ED Course: Upon arrival to the ED, patient is found to be afebrile, saturating mid-90's on room air, RR 22, BP 97/57, and heart rate of 140.  EKG with atrial fibrillation, rate 138.  CBC and chem panel unremarkable.  Troponin 0.86 initially, decreased when repeated 4 hours later.  proBNP 4440.  D-dimer elevated and CTA chest  concerning for extensive bilateral pulmonary embolism with evidence for right heart strain.  Patient was given a fluid bolus, IV diltiazem load and infusion, and started on IV heparin.  Systolic blood pressure remained in the 100s, oxygen saturations remained in the 90s on room air, and arrangements were made for transfer to [facility] for admission   Subjective:    Miranda Moses  [place] today has, No headache, No chest pain, she does report some dyspnea, cough, and generalized weakness.   Assessment  & Plan :    Principal Problem:   Acute pulmonary embolism ([place]) Active Problems:   Hypertension   New onset atrial fibrillation ([facility])   Atrial fibrillation with RVR ([place])   COVID-19 virus infection    Acute pulmonary embolism  - Presents with lightheadedness, found to be in new-onset a fib with RVR, d-dimer 7925, and CTA chest with extensive bilateral PE, RV/LV 1.4  - No chest pain or respiratory distress  - Troponin 0.86 in ED (nl <0.04), down to 0.64 four hours later, pro-BNP 4440 - Started on IV heparin in ED , continue with heparin gtt. for 48 hours, given evidence of right heart strain on CT - Continue IV heparin, continue cardiac monitoring, check echocardiogram    New-onset atrial fibrillation with RVR  - Presents in atrial fibrillation with RVR, appears to be new-onset  - Rate controlled in ED with IVF and IV diltiazem  - Likely precipitated by the pulmonary disease  - CHADS-VASc 5 (age x2, CAD, HTN, gender) - Continue cardiac monitoring, continue diltiazem infusion and convert to PO as able, continue IV heparin  COVID-19 pneumonia - Initially diagnosed on 07/09/19, confirmed in ED  - CTA chest hazy ground glass opacities   - RR 20, saturation 94% on ra at time of admission  - Start remdesivir, check/trend markers      COVID-19 Labs  Recent Labs    07/23/19 0651  DDIMER 17.10*  FERRITIN 138  CRP 4.7*    No results found for: SARSCOV2NAA CAD - No angina  - She had  been intolerant of statins, continue ASA   Hypertension  - BP was initially in ED, normalized with IVF  - Antihypertensives held on admission   Chronic diastolic CHF  - Appears hypervolemic   - EF 60-65% in April 2020  - Given IVF in ED d/t low BP initially    - Resume Lasix if BP remains stable, SLIV, hold beta-blocker in light of acute PE     Code Status : Full  Disposition Plan  : Home  Barriers For Discharge : IV heparin drip, and IV remdesivir  Consults  :  None  Procedures  : None  DVT Prophylaxis  :  Heparin GTT  Lab Results  Component Value Date   PLT 136 (L) 07/23/2019    Antibiotics  :    Anti-infectives (From admission, onward)   Start     Dose/Rate Route Frequency Ordered Stop   07/24/19 1000  remdesivir 100 mg in sodium chloride 0.9 % 100 mL IVPB     100 mg 200 mL/hr over 30 Minutes Intravenous Daily 07/23/19 0555 07/28/19 0959   07/23/19 0700  remdesivir 200 mg in sodium chloride 0.9% 250 mL IVPB     200 mg 580 mL/hr over 30 Minutes Intravenous Once 07/23/19 0555 07/23/19 0902        Objective:   Vitals:   07/23/19 0808 07/23/19 1000 07/23/19 1100 07/23/19 1200  BP: 99/70 117/75 103/67 138/64  Pulse: 88 (!) 106 67 92  Resp: (!) 25 (!) 28 (!) 23 (!) 32  Temp:      TempSrc:      SpO2: 98% 95% 92% 96%  Weight:      Height:        Wt Readings from Last 3 Encounters:  07/23/19 113 kg  04/22/19 107.5 kg  01/01/19 106.7  kg     Intake/Output Summary (Last 24 hours) at 07/23/2019 1220 Last data filed at 07/23/2019 1200 Gross per 24 hour  Intake 593.9 ml  Output 250 ml  Net 343.9 ml     Physical Exam  Awake Alert, Oriented X 3, No new F.N deficits, Normal affect Symmetrical Chest wall movement, Good air movement bilaterally, CTAB RRR,No Gallops,Rubs or new Murmurs, No Parasternal Heave +ve B.Sounds, Abd Soft, No tenderness, No rebound - guarding or rigidity. No Cyanosis, Clubbing , +2  edema, No new Rash or bruise      Data  Review:    CBC Recent Labs  Lab 07/23/19 0651  WBC 5.3  HGB 11.6*  HCT 36.3  PLT 136*  MCV 86.2  MCH 27.6  MCHC 32.0  RDW 14.5  LYMPHSABS 0.8  MONOABS 0.5  EOSABS 0.0  BASOSABS 0.0    Chemistries  Recent Labs  Lab 07/23/19 0651  NA 135  K 3.7  CL 104  CO2 18*  GLUCOSE 107*  BUN 24*  CREATININE 0.84  CALCIUM 8.0*  MG 1.9  AST 32  ALT 22  ALKPHOS 62  BILITOT 0.8   ------------------------------------------------------------------------------------------------------------------ No results for input(s): CHOL, HDL, LDLCALC, TRIG, CHOLHDL, LDLDIRECT in the last 72 hours.  No results found for: HGBA1C ------------------------------------------------------------------------------------------------------------------ Recent Labs    07/23/19 0651  TSH 0.819   ------------------------------------------------------------------------------------------------------------------ Recent Labs    07/23/19 0651  FERRITIN 138    Coagulation profile No results for input(s): INR, PROTIME in the last 168 hours.  Recent Labs    07/23/19 0651  DDIMER 17.10*    Cardiac Enzymes No results for input(s): CKMB, TROPONINI, MYOGLOBIN in the last 168 hours.  Invalid input(s): CK ------------------------------------------------------------------------------------------------------------------ No results found for: BNP  Inpatient Medications  Scheduled Meds: . aspirin EC  81 mg Oral Daily  . pantoprazole  40 mg Oral Daily  . sodium chloride flush  3 mL Intravenous Q12H  . sodium chloride flush  3 mL Intravenous Q12H   Continuous Infusions: . sodium chloride    . diltiazem (CARDIZEM) infusion 5 mg/hr (07/23/19 1200)  . heparin 1,050 Units/hr (07/23/19 1200)  . [START ON 07/24/2019] remdesivir 100 mg in NS 100 mL     PRN Meds:.sodium chloride, acetaminophen, HYDROcodone-acetaminophen, morphine injection, ondansetron **OR** ondansetron (ZOFRAN) IV, senna-docusate, sodium  chloride flush  Micro Results No results found for this or any previous visit (from the past 240 hour(s)).  Radiology Reports No results found.   Phillips Climes M.D on 07/23/2019 at 12:20 PM  Between 7am to 7pm - Pager - [phone]  After 7pm go to [email or web address] - password [email or web address]  [facility] -  Office  [phone]

## 2019-07-23 NOTE — Progress Notes (Signed)
  Echocardiogram 2D Echocardiogram has been performed.  Miranda Moses 07/23/2019, 2:43 PM

## 2019-07-23 NOTE — H&P (Signed)
History and Physical    Miranda Moses [id number] DOB: 1941-03-27 DOA: 07/23/2019  PCP: [name], PA-C   Patient coming from: Home   Chief Complaint: Fatigue, malaise, lightheadedness  HPI: Miranda Moses is a 79 y.o. female with medical history significant for hypertension, prediabetes, pulmonary arterial hypertension, chronic diastolic CHF, coronary artery disease, and COVID-19 diagnosed on 07/09/2019, now presenting to the emergency department for evaluation of fatigue, lightheadedness, and malaise.  Patient reports that she developed a cough just over 2 weeks ago, was tested for COVID-19 the following day (07/09/2019), was positive, and has gone on to have progressive malaise and fatigue.  She suspected that the lightheadedness was due to dehydration so she stopped taking her Lasix and increased her fluid intake.  She continued to feel poorly and went to the emergency department last night.  She has not had any chest pain or palpitations associated with this.  Her chronic bilateral leg swelling has worsened over the past few days.  She has not noted any leg tenderness though.  Denies hemoptysis.  [facility] ED Course: Upon arrival to the ED, patient is found to be afebrile, saturating mid-90's on room air, RR 22, BP 97/57, and heart rate of 140.  EKG with atrial fibrillation, rate 138.  CBC and chem panel unremarkable.  Troponin 0.86 initially, decreased when repeated 4 hours later.  proBNP 4440.  D-dimer elevated and CTA chest concerning for extensive bilateral pulmonary embolism with evidence for right heart strain.  Patient was given a fluid bolus, IV diltiazem load and infusion, and started on IV heparin.  Systolic blood pressure remained in the 100s, oxygen saturations remained in the 90s on room air, and arrangements were made for transfer to [facility] for admission.  Review of Systems:  All other systems reviewed and apart from HPI, are negative.  Past Medical History:  Diagnosis  Date  . GERD (gastroesophageal reflux disease)   . Hyperlipidemia   . Hypertension   . Pre-diabetes     Past Surgical History:  Procedure Laterality Date  . ABDOMINAL HYSTERECTOMY    . CATARACT EXTRACTION Left 03/2014  . CATARACT EXTRACTION Right 05/2014  . COLONOSCOPY  11/05/2013     reports that she has never smoked. She has never used smokeless tobacco. She reports that she does not drink alcohol or use drugs.  Allergies  Allergen Reactions  . Clarithromycin     Numbness of extremities  . Codeine Other (See Comments)    Caused pain  . Penicillins Rash    Family History  Problem Relation Age of Onset  . Heart disease Father      Prior to Admission medications   Medication Sig Start Date End Date Taking? Authorizing Provider  aspirin EC 81 MG tablet Take 81 mg by mouth daily.    [provider]  bisoprolol (ZEBETA) 5 MG tablet Take 2.5 mg by mouth daily.    [provider]  furosemide (LASIX) 40 MG tablet Take 1 tablet (40 mg total) by mouth daily. 01/01/19   Richardo Priest, MD  isosorbide mononitrate (IMDUR) 30 MG 24 hr tablet TAKE 1 TABLET(30 MG) BY MOUTH DAILY 05/26/19   Richardo Priest, MD  omeprazole (PRILOSEC) 20 MG capsule Take 20 mg by mouth daily.     [provider]  pravastatin (PRAVACHOL) 20 MG tablet Take 1 tablet (20 mg total) by mouth 2 (two) times a week. 10/12/18 04/22/19  Richardo Priest, MD  tobramycin (TOBREX) 0.3 % ophthalmic solution  04/20/19   [provider]  triamcinolone cream (KENALOG) 0.5 % Apply 1 application topically daily as needed. 04/08/18   [provider]    Physical Exam: There were no vitals filed for this visit.   Constitutional: NAD, calm  Eyes: PERTLA, lids and conjunctivae normal ENMT: Mucous membranes are moist. Posterior pharynx clear of any exudate or lesions.   Neck: normal, supple, no masses, no thyromegaly Respiratory: Speaking full sentances, no wheezing, no crackles.     Cardiovascular: S1 & S2 heard, regular rate and rhythm. Pitting edema bilateral LE's.  Abdomen: No distension, no tenderness, soft. Bowel sounds active.  Musculoskeletal: no clubbing / cyanosis. No joint deformity upper and lower extremities.   Skin: no significant rashes, lesions, ulcers. Warm, dry, well-perfused. Neurologic: no facial asymmetry. Sensation intact. Moving all extremities.  Psychiatric: Alert and oriented. Pleasant and cooperative.    Labs and Imaging on Admission: I have personally reviewed following labs and imaging studies  CBC: No results for input(s): WBC, NEUTROABS, HGB, HCT, MCV, PLT in the last 168 hours. Basic Metabolic Panel: No results for input(s): NA, K, CL, CO2, GLUCOSE, BUN, CREATININE, CALCIUM, MG, PHOS in the last 168 hours. GFR: CrCl cannot be calculated (Patient's most recent lab result is older than the maximum 21 days allowed.). Liver Function Tests: No results for input(s): AST, ALT, ALKPHOS, BILITOT, PROT, ALBUMIN in the last 168 hours. No results for input(s): LIPASE, AMYLASE in the last 168 hours. No results for input(s): AMMONIA in the last 168 hours. Coagulation Profile: No results for input(s): INR, PROTIME in the last 168 hours. Cardiac Enzymes: No results for input(s): CKTOTAL, CKMB, CKMBINDEX, TROPONINI in the last 168 hours. BNP (last 3 results) Recent Labs    08/28/18 1050  PROBNP 154   HbA1C: No results for input(s): HGBA1C in the last 72 hours. CBG: No results for input(s): GLUCAP in the last 168 hours. Lipid Profile: No results for input(s): CHOL, HDL, LDLCALC, TRIG, CHOLHDL, LDLDIRECT in the last 72 hours. Thyroid Function Tests: No results for input(s): TSH, T4TOTAL, FREET4, T3FREE, THYROIDAB in the last 72 hours. Anemia Panel: No results for input(s): VITAMINB12, FOLATE, FERRITIN, TIBC, IRON, RETICCTPCT in the last 72 hours. Urine analysis: No results found for: COLORURINE, APPEARANCEUR, LABSPEC, PHURINE, GLUCOSEU,  HGBUR, BILIRUBINUR, KETONESUR, PROTEINUR, UROBILINOGEN, NITRITE, LEUKOCYTESUR Sepsis Labs: @LABRCNTIP (procalcitonin:4,lacticidven:4) )No results found for this or any previous visit (from the past 240 hour(s)).   Radiological Exams on Admission: No results found.  EKG: Independently reviewed. Atrial fibrillation, rate 138.   Assessment/Plan   1. Acute pulmonary embolism  - Presents with lightheadedness, found to be in new-onset a fib with RVR, d-dimer 7925, and CTA chest with extensive bilateral PE, RV/LV 1.4  - No chest pain or respiratory distress  - Troponin 0.86 in ED (nl <0.04), down to 0.64 four hours later, pro-BNP 4440 - Started on IV heparin in ED  - Continue IV heparin, continue cardiac monitoring, check echocardiogram    2. New-onset atrial fibrillation with RVR  - Presents in atrial fibrillation with RVR, appears to be new-onset  - Rate controlled in ED with IVF and IV diltiazem  - Likely precipitated by the pulmonary disease  - CHADS-VASc 5 (age x2, CAD, HTN, gender) - Continue cardiac monitoring, continue diltiazem infusion and convert to PO as able, continue IV heparin    3. COVID-19 infection   - Initially diagnosed on 07/09/19, confirmed in ED  - CTA chest hazy ground glass opacities   - RR 20,  saturation 94% on ra at time of admission  - Start remdesivir, check/trend markers    4. CAD - No angina  - She had been intolerant of statins, continue ASA   5. Hypertension  - BP was initially in ED, normalized with IVF  - Antihypertensives held on admission   6. Chronic diastolic CHF  - Appears hypervolemic   - EF 60-65% in April 2020  - Given IVF in ED d/t low BP initially    - Resume Lasix if BP remains stable, SLIV, hold beta-blocker in light of acute PE     DVT prophylaxis: IV heparin  Code Status: Full  Family Communication: Discussed with patient  Consults called: None  Admission status: Inpatient     [name], MD [facility] Pager:  See [email or web address]  If 7AM-7PM, please contact the daytime attending [email or web address]  07/23/2019, 5:55 AM

## 2019-07-23 NOTE — Progress Notes (Signed)
[place] for heparin infusion Indication: pulmonary embolus  Allergies  Allergen Reactions  . Clarithromycin     Numbness of extremities  . Codeine Other (See Comments)    Caused pain  . Penicillins Rash    Patient Measurements: Height: 5\' 5"  (165.1 cm) Weight: 249 lb 3.2 oz (113 kg) IBW/kg (Calculated) : 57 Heparin Dosing Weight: HEPARIN DW (KG): 83.8  Vital Signs: Temp: 98.4 F (36.9 C) (01/29 0618) Temp Source: Oral (01/29 0618) BP: 103/67 (01/29 1100) Pulse Rate: 67 (01/29 1100)  Labs: Recent Labs    07/23/19 0651 07/23/19 1027  HGB 11.6*  --   HCT 36.3  --   PLT 136*  --   HEPARINUNFRC >2.20* 0.83*  CREATININE 0.84  --     Estimated Creatinine Clearance: 71.4 mL/min (by C-G formula based on SCr of 0.84 mg/dL).     Assessment: Pharmacy consulted to dose heparin infusion for this 79 yo female with acute PE and right heart strain. She was started on heparin infusion at [facility] with a bolus of 5000 units and a rate of 1200 units/hr.  Heparin level 0.83    Labs from [facility]: Plates: 140K Hb: 12.7 mg/dL D-Dimer: 7925 ng/mL-->converts to 15.63mcg/mL-FEU Baseline aPTT: 24.1 seconds SCr 1.0mg /dL LDH: 590 IU/L   Goal of Therapy:  Heparin level 0.3-0.7 units/ml Monitor platelets by anticoagulation protocol: Yes   Plan:  Decrease heparin to 1050 units / hr Check anti-Xa level in 6-8 hours and daily while on heparin Monitor CBC and signs/symptoms of bleeding.   Tad Moore 07/23/2019,11:38 AM

## 2019-07-23 NOTE — Progress Notes (Signed)
[place] for heparin infusion Indication: pulmonary embolus  Allergies  Allergen Reactions  . Clarithromycin     Numbness of extremities  . Codeine Other (See Comments)    Caused pain  . Penicillins Rash    Did it involve swelling of the face/tongue/throat, SOB, or low BP? No Did it involve sudden or severe rash/hives, skin peeling, or any reaction on the inside of your mouth or nose? Yes Did you need to seek medical attention at a hospital or doctor's office? Yes When did it last happen?20 years If all above answers are "NO", may proceed with cephalosporin use.     Patient Measurements: Height: 5\' 5"  (165.1 cm) Weight: 249 lb 3.2 oz (113 kg) IBW/kg (Calculated) : 57 Heparin Dosing Weight: HEPARIN DW (KG): 83.8  Vital Signs: Temp: 98.5 F (36.9 C) (01/29 1944) Temp Source: Oral (01/29 1944) BP: 117/77 (01/29 1944) Pulse Rate: 91 (01/29 1944)  Labs: Recent Labs    07/23/19 0651 07/23/19 1027 07/23/19 1955  HGB 11.6*  --   --   HCT 36.3  --   --   PLT 136*  --   --   HEPARINUNFRC >2.20* 0.83* 0.70  CREATININE 0.84  --   --     Estimated Creatinine Clearance: 71.4 mL/min (by C-G formula based on SCr of 0.84 mg/dL).     Assessment: Pharmacy consulted to dose heparin infusion for this 79 yo female with acute PE and right heart strain. She was started on heparin infusion at [facility] with a bolus of 5000 units and a rate of 1200 units/hr.  Heparin level at upper end of therapeutic range (0.7) on 1050 units/hr.  No bleeding issues reported per discussion with RN.   Goal of Therapy:  Heparin level 0.3-0.7 units/ml Monitor platelets by anticoagulation protocol: Yes   Plan:  Continue heparin infusion at 1050 units / hr Check anti-Xa level daily while on heparin Monitor CBC and signs/symptoms of bleeding.  Peggyann Juba, PharmD, BCPS Pharmacy: [phone] 07/23/2019,9:08 PM

## 2019-07-23 NOTE — Progress Notes (Signed)
Previous shift titrated cardizem gtt 5-7.5mg /hr per order parameters. Thus far this shift, AFIB HR 80s to low 100s at rest. SBP 110s. Will titrated accordingly as tolerated- patient educated. Patient denies any pain, no acute distress and sp02 90%s on RA. Dyspnea noted with exertion minimal exertion in bed, no major desaturations noted. Heparin gtt unchanged as per pharmacist at 10.60ml/hr, patient educated on bleeding precautions/monitoring- states understanding.

## 2019-07-23 NOTE — Plan of Care (Signed)
  Problem: Education: Goal: Knowledge of risk factors and measures for prevention of condition will improve 07/23/2019 2351 by Ellison Hughs, RN Outcome: Progressing 07/23/2019 2350 by Ellison Hughs, RN Outcome: Progressing   Problem: Coping: Goal: Psychosocial and spiritual needs will be supported 07/23/2019 2351 by Ellison Hughs, RN Outcome: Progressing 07/23/2019 2350 by Ellison Hughs, RN Outcome: Progressing   Problem: Respiratory: Goal: Will maintain a patent airway 07/23/2019 2351 by Ellison Hughs, RN Outcome: Progressing Note: Remains on room air 07/23/2019 2350 by Ellison Hughs, RN Outcome: Progressing

## 2019-07-23 NOTE — Progress Notes (Signed)
Patient arrived via transport. VSS, O2 >90%, no distress noted. Cardizem gtt and heparin gtt infusing at this time, patient placed on telemetry and oriented to room and plan of care. Warrick Parisian, RN

## 2019-07-24 ENCOUNTER — Inpatient Hospital Stay (HOSPITAL_COMMUNITY): Payer: Medicare Other

## 2019-07-24 LAB — COMPREHENSIVE METABOLIC PANEL
ALT: 22 U/L (ref 0–44)
AST: 30 U/L (ref 15–41)
Albumin: 2.9 g/dL — ABNORMAL LOW (ref 3.5–5.0)
Alkaline Phosphatase: 59 U/L (ref 38–126)
Anion gap: 10 (ref 5–15)
BUN: 26 mg/dL — ABNORMAL HIGH (ref 8–23)
CO2: 23 mmol/L (ref 22–32)
Calcium: 8.3 mg/dL — ABNORMAL LOW (ref 8.9–10.3)
Chloride: 105 mmol/L (ref 98–111)
Creatinine, Ser: 0.86 mg/dL (ref 0.44–1.00)
GFR calc Af Amer: >60 mL/min (ref >60)
GFR calc non Af Amer: >60 mL/min (ref >60)
Glucose, Bld: 99 mg/dL (ref 70–99)
Potassium: 3.8 mmol/L (ref 3.5–5.1)
Sodium: 138 mmol/L (ref 135–145)
Total Bilirubin: 0.8 mg/dL (ref 0.3–1.2)
Total Protein: 6.1 g/dL — ABNORMAL LOW (ref 6.5–8.1)

## 2019-07-24 LAB — HEPARIN LEVEL (UNFRACTIONATED): Heparin Unfractionated: 0.48 IU/mL (ref 0.30–0.70)

## 2019-07-24 LAB — CBC WITH DIFFERENTIAL/PLATELET
Abs Immature Granulocytes: 0.01 10*3/uL (ref 0.00–0.07)
Basophils Absolute: 0 10*3/uL (ref 0.0–0.1)
Basophils Relative: 0 %
Eosinophils Absolute: 0 10*3/uL (ref 0.0–0.5)
Eosinophils Relative: 1 %
HCT: 35.7 % — ABNORMAL LOW (ref 36.0–46.0)
Hemoglobin: 11.4 g/dL — ABNORMAL LOW (ref 12.0–15.0)
Immature Granulocytes: 0 %
Lymphocytes Relative: 11 %
Lymphs Abs: 0.7 10*3/uL (ref 0.7–4.0)
MCH: 27.7 pg (ref 26.0–34.0)
MCHC: 31.9 g/dL (ref 30.0–36.0)
MCV: 86.7 fL (ref 80.0–100.0)
Monocytes Absolute: 0.6 10*3/uL (ref 0.1–1.0)
Monocytes Relative: 10 %
Neutro Abs: 4.6 10*3/uL (ref 1.7–7.7)
Neutrophils Relative %: 78 %
Platelets: 167 10*3/uL (ref 150–400)
RBC: 4.12 MIL/uL (ref 3.87–5.11)
RDW: 14.5 % (ref 11.5–15.5)
WBC: 6 10*3/uL (ref 4.0–10.5)
nRBC: 0 % (ref 0.0–0.2)

## 2019-07-24 LAB — MAGNESIUM: Magnesium: 1.9 mg/dL (ref 1.7–2.4)

## 2019-07-24 LAB — C-REACTIVE PROTEIN: CRP: 5.4 mg/dL — ABNORMAL HIGH (ref <1.0)

## 2019-07-24 LAB — D-DIMER, QUANTITATIVE: D-Dimer, Quant: 3.8 ug/mL-FEU — ABNORMAL HIGH (ref 0.00–0.50)

## 2019-07-24 LAB — FERRITIN: Ferritin: 111 ng/mL (ref 11–307)

## 2019-07-24 MED ORDER — METOPROLOL TARTRATE 50 MG PO TABS
50.0000 mg | ORAL_TABLET | Freq: Two times a day (BID) | ORAL | Status: DC
  Administered 2019-07-24 – 2019-07-30 (×13): 50 mg via ORAL
  Filled 2019-07-24 (×13): qty 1

## 2019-07-24 MED ORDER — MAGNESIUM SULFATE IN D5W 1-5 GM/100ML-% IV SOLN
1.0000 g | Freq: Once | INTRAVENOUS | Status: AC
  Administered 2019-07-24: 1 g via INTRAVENOUS
  Filled 2019-07-24: qty 100

## 2019-07-24 MED ORDER — ATORVASTATIN CALCIUM 10 MG PO TABS
20.0000 mg | ORAL_TABLET | Freq: Every day | ORAL | Status: DC
  Administered 2019-07-24 – 2019-07-30 (×7): 20 mg via ORAL
  Filled 2019-07-24 (×8): qty 2

## 2019-07-24 MED ORDER — POTASSIUM CHLORIDE CRYS ER 20 MEQ PO TBCR
40.0000 meq | EXTENDED_RELEASE_TABLET | Freq: Once | ORAL | Status: AC
  Administered 2019-07-24: 40 meq via ORAL
  Filled 2019-07-24: qty 2

## 2019-07-24 NOTE — Progress Notes (Signed)
Diltiazem gtt titrated up 2.5mg - from 7.5mg /hr to 10mg /hr for AFIB rate ranging 100-120s while awake this morning and as BP permitting. 2L 02 [place] applied for comfort overnight for tachypnea/dyspnea. No acute distress, respirations are unlabored- room air sats 91-94%.

## 2019-07-24 NOTE — Progress Notes (Signed)
[employer] for heparin infusion Indication: pulmonary embolus  Allergies  Allergen Reactions  . Clarithromycin     Numbness of extremities  . Codeine Other (See Comments)    Caused pain  . Penicillins Rash    Did it involve swelling of the face/tongue/throat, SOB, or low BP? No Did it involve sudden or severe rash/hives, skin peeling, or any reaction on the inside of your mouth or nose? Yes Did you need to seek medical attention at a hospital or doctor's office? Yes When did it last happen?20 years If all above answers are "NO", may proceed with cephalosporin use.     Patient Measurements: Height: 5\' 5"  (165.1 cm) Weight: 249 lb 3.2 oz (113 kg) IBW/kg (Calculated) : 57 Heparin Dosing Weight: HEPARIN DW (KG): 83.8  Vital Signs: Temp: 98.5 F (36.9 C) (01/30 0409) Temp Source: Oral (01/30 0409) BP: 122/60 (01/30 0442) Pulse Rate: 83 (01/30 0442)  Labs: Recent Labs    07/23/19 0651 07/23/19 0651 07/23/19 1027 07/23/19 1955 07/24/19 0302  HGB 11.6*  --   --   --  11.4*  HCT 36.3  --   --   --  35.7*  PLT 136*  --   --   --  167  HEPARINUNFRC >2.20*   < > 0.83* 0.70 0.48  CREATININE 0.84  --   --   --  0.86   < > = values in this interval not displayed.    Estimated Creatinine Clearance: 69.8 mL/min (by C-G formula based on SCr of 0.86 mg/dL).   Assessment: Pharmacy consulted to dose heparin infusion for this 79 yo female with acute PE and right heart strain. She was started on heparin infusion at [facility] with a bolus of 5000 units and a rate of 1200 units/hr.  07/24/19 0500 UPDATE Heparin level: 0.48 IU/mL, within therapeutic goal range  RN reports no bleeding complications or issues with infusion site CBC: H/H 11.4/35.7    Plates 167K      D-Dimer: 17.10>3.8 mcg/mL-FEU  Goal of Therapy:  Heparin level 0.3-0.7 units/ml Monitor platelets by anticoagulation protocol: Yes   Plan:  Continue heparin infusion at  1050 units/hr Check anti-Xa level daily while on heparin Monitor CBC and signs/symptoms of bleeding.  Despina Pole, Pharm. D. Clinical Pharmacist 07/24/2019 5:28 AM

## 2019-07-24 NOTE — Progress Notes (Signed)
PROGRESS NOTE                                                                                                                                                                                                             Patient Demographics:    Miranda Moses, is a 79 y.o. female, DOB - 10-08-1940, [id number]  Admit date - 07/23/2019   Admitting Physician [name], MD  Outpatient Primary MD for the patient is [name], PA-C  LOS - 1   No chief complaint on file.      Brief Narrative     79 y.o. female with medical history significant for hypertension, prediabetes, pulmonary arterial hypertension, chronic diastolic CHF, coronary artery disease, and COVID-19 diagnosed on 07/09/2019, now presenting to the emergency department for evaluation of fatigue, lightheadedness, and malaise.  tested positive for COVID-19 (07/09/2019), reports chronic lower extremity edema. Upon arrival to the ED, patient is found to be afebrile, saturating mid-90's on room air, RR 22, BP 97/57, and heart rate of 140.  EKG with atrial fibrillation, rate 138.  CBC and chem panel unremarkable.  Troponin 0.86 initially, decreased when repeated 4 hours later.  proBNP 4440.  D-dimer elevated and CTA chest concerning for extensive bilateral pulmonary embolism with evidence for right heart strain.  Patient was given a fluid bolus, IV diltiazem load and infusion, and started on IV heparin.  Was transferred to [facility] for further management.   Subjective:    [name] today is any chest pain, headache, still reports dyspnea, cough and generalized weakness .   Assessment  & Plan :    Principal Problem:   Acute pulmonary embolism (HCC) Active Problems:   Hypertension   New onset atrial fibrillation (HCC)   Atrial fibrillation with RVR ([place])   COVID-19 virus infection    Acute pulmonary embolism  - Presents with d-dimer 7925, and CTA chest with extensive bilateral PE, RV/LV 1.4    - D echo significant for right heart strain with right ventricle moderately dilated and with reduced function. - No chest pain or respiratory distress , but she reports dyspnea. -Troponins elevated on admission, but trending down. -Given significant clot burden, and right heart strain, will continue with heparin gtt. at least 48 hours since admission, then can transition to Eliquis.  New-onset atrial fibrillation with RVR  -  Presents in atrial fibrillation with RVR, appears to be new-onset  -She is on Cardizem drip, improving, added metoprolol, Cardizem drip was stopped today. - Rate controlled in ED with IVF and IV diltiazem  - Likely precipitated by the pulmonary disease  - CHADS-VASc 5 (age x2, CAD, HTN, gender), continue with IV heparin  COVID-19 pneumonia - Initially diagnosed on 07/09/19, confirmed in ED  - CTA chest hazy ground glass opacities   - RR 20, saturation 94% on ra at time of admission  - Start remdesivir,  -No hypoxia, so far no indication for steroids   COVID-19 Labs  Recent Labs    07/23/19 0651 07/24/19 0302  DDIMER 17.10* 3.80*  FERRITIN 138 111  CRP 4.7* 5.4*    No results found for: SARSCOV2NAA CAD - No angina  - She had been intolerant of statins, continue ASA   Hypertension  - Pressure remains soft, continue to hold bisoprolol and Imdur .  Chronic diastolic CHF  - Appears hypervolemic   - EF 60-65% in April 2020  - But blood pressure remains on the lower side, so we will hold on diuresis currently.  Code Status : Full  Family communication: D/W daughter via phone  Disposition Plan  : Home  Barriers For Discharge : IV heparin drip(another 24 to 48 hours), and IV remdesivir  Consults  :  None  Procedures  : None  DVT Prophylaxis  :  Heparin GTT  Lab Results  Component Value Date   PLT 167 07/24/2019    Antibiotics  :    Anti-infectives (From admission, onward)   Start     Dose/Rate Route Frequency Ordered Stop   07/24/19  1000  remdesivir 100 mg in sodium chloride 0.9 % 100 mL IVPB     100 mg 200 mL/hr over 30 Minutes Intravenous Daily 07/23/19 0555 07/28/19 0959   07/23/19 0700  remdesivir 200 mg in sodium chloride 0.9% 250 mL IVPB     200 mg 580 mL/hr over 30 Minutes Intravenous Once 07/23/19 0555 07/23/19 0902        Objective:   Vitals:   07/24/19 0600 07/24/19 0700 07/24/19 0800 07/24/19 1122  BP: 111/79 120/69 (!) 121/57 93/62  Pulse: 95 92 95 83  Resp: (!) 25  (!) 32 19  Temp:   98.4 F (36.9 C) 98 F (36.7 C)  TempSrc:   Oral Oral  SpO2: 96% 97% 96% 98%  Weight:      Height:        Wt Readings from Last 3 Encounters:  07/23/19 113 kg  04/22/19 107.5 kg  01/01/19 106.7 kg     Intake/Output Summary (Last 24 hours) at 07/24/2019 1325 Last data filed at 07/24/2019 0600 Gross per 24 hour  Intake 426.91 ml  Output 100 ml  Net 326.91 ml     Physical Exam  Awake Alert, Oriented X 3, No new F.N deficits, Normal affect Symmetrical Chest wall movement, Good air movement bilaterally, CTAB Irregular irregular,No Gallops,Rubs or new Murmurs, No Parasternal Heave +ve B.Sounds, Abd Soft, No tenderness, No rebound - guarding or rigidity. No Cyanosis, Clubbing ,+1 edema, No new Rash or bruise       Data Review:    CBC Recent Labs  Lab 07/23/19 0651 07/24/19 0302  WBC 5.3 6.0  HGB 11.6* 11.4*  HCT 36.3 35.7*  PLT 136* 167  MCV 86.2 86.7  MCH 27.6 27.7  MCHC 32.0 31.9  RDW 14.5 14.5  LYMPHSABS 0.8 0.7  MONOABS  0.5 0.6  EOSABS 0.0 0.0  BASOSABS 0.0 0.0    Chemistries  Recent Labs  Lab 07/23/19 0651 07/24/19 0302  NA 135 138  K 3.7 3.8  CL 104 105  CO2 18* 23  GLUCOSE 107* 99  BUN 24* 26*  CREATININE 0.84 0.86  CALCIUM 8.0* 8.3*  MG 1.9 1.9  AST 32 30  ALT 22 22  ALKPHOS 62 59  BILITOT 0.8 0.8   ------------------------------------------------------------------------------------------------------------------ No results for input(s): CHOL, HDL, LDLCALC, TRIG,  CHOLHDL, LDLDIRECT in the last 72 hours.  No results found for: HGBA1C ------------------------------------------------------------------------------------------------------------------ Recent Labs    07/23/19 0651  TSH 0.819   ------------------------------------------------------------------------------------------------------------------ Recent Labs    07/23/19 0651 07/24/19 0302  FERRITIN 138 111    Coagulation profile No results for input(s): INR, PROTIME in the last 168 hours.  Recent Labs    07/23/19 0651 07/24/19 0302  DDIMER 17.10* 3.80*    Cardiac Enzymes No results for input(s): CKMB, TROPONINI, MYOGLOBIN in the last 168 hours.  Invalid input(s): CK ------------------------------------------------------------------------------------------------------------------ No results found for: BNP  Inpatient Medications  Scheduled Meds: . aspirin EC  81 mg Oral Daily  . atorvastatin  20 mg Oral Daily  . metoprolol tartrate  50 mg Oral BID  . pantoprazole  40 mg Oral Daily  . potassium chloride  40 mEq Oral Once  . sodium chloride flush  3 mL Intravenous Q12H  . sodium chloride flush  3 mL Intravenous Q12H   Continuous Infusions: . sodium chloride    . diltiazem (CARDIZEM) infusion Stopped (07/24/19 1127)  . heparin 1,050 Units/hr (07/24/19 0441)  . magnesium sulfate bolus IVPB    . remdesivir 100 mg in NS 100 mL 100 mg (07/24/19 0904)   PRN Meds:.sodium chloride, acetaminophen, HYDROcodone-acetaminophen, morphine injection, ondansetron **OR** ondansetron (ZOFRAN) IV, senna-docusate, sodium chloride, sodium chloride flush  Micro Results No results found for this or any previous visit (from the past 240 hour(s)).  Radiology Reports ECHOCARDIOGRAM LIMITED  Result Date: 07/23/2019   ECHOCARDIOGRAM LIMITED REPORT   Patient Name:   YOSELYN Karpf Date of Exam: 07/23/2019 Medical Rec #:  [id number]  Height:       65.0 in Accession #:    [id number] Weight:       249.2  lb Date of Birth:  [birth year]  BSA:          2.17 m Patient Age:    6 years   BP:           115/91 mmHg Patient Gender: F          HR:           103 bpm. Exam Location:  Inpatient  Procedure: Limited Echo, Cardiac Doppler and Limited Color Doppler Indications:    I26.02 Pulmonary embolus  History:        Patient has prior history of Echocardiogram examinations, most                 recent 10/14/2018.  Sonographer:    Jonelle Sidle Dance Referring Phys: [id number] [place]  1. Left ventricular ejection fraction, by visual estimation, is 55 to 60%.  2. Global right ventricle has moderately reduced systolic function.The right ventricular size is moderately enlarged. no increase in right ventricular wall thickness.  3. The RV is moderately dilated with moderately reduced RV function.  4. Right atrial size was severely dilated.  5. The mitral valve is grossly normal. Mild mitral valve regurgitation.  6. The tricuspid valve was  normal in structure. Tricuspid valve regurgitation is mild.  7. Tricuspid valve regurgitation is mild.  8. The pulmonic valve was normal in structure. Pulmonic valve regurgitation is mild by color flow Doppler.  9. Mildly elevated pulmonary artery systolic pressure. FINDINGS  Left Ventricle: Left ventricular ejection fraction, by visual estimation, is 55 to 60%. Right Ventricle: The right ventricular size is moderately enlarged. No increase in right ventricular wall thickness. Global RV systolic function is has moderately reduced systolic function. The tricuspid regurgitant velocity is 2.33 m/s, and with an assumed right atrial pressure of 15 mmHg, the estimated right ventricular systolic pressure is mildly elevated at 36.8 mmHg. The RV is moderately dilated with moderately reduced RV function. Right Atrium: Right atrial size was severely dilated. Right atrial pressure is estimated at 15 mmHg. Mitral Valve: The mitral valve is grossly normal. MV Area by PHT, 3.77 cm. MV PHT, 58.29 msec.  Mild mitral valve regurgitation. Tricuspid Valve: The tricuspid valve is normal in structure. Tricuspid valve regurgitation is mild. Pulmonic Valve: The pulmonic valve was normal in structure. Pulmonic valve regurgitation is mild by color flow Doppler. Pulmonic regurgitation is mild by color flow Doppler. Aorta: The aortic root and ascending aorta are structurally normal, with no evidence of dilitation.  LEFT VENTRICLE          Normals PLAX 2D LVIDd:         3.69 cm  3.6 cm LVIDs:         2.81 cm  1.7 cm LV PW:         1.37 cm  1.4 cm LV IVS:        1.02 cm  1.3 cm LVOT diam:     1.90 cm  2.0 cm LV SV:         28 ml    79 ml LV SV Index:   11.96    45 ml/m2 LVOT Area:     2.84 cm 3.14 cm2  RIGHT VENTRICLE          IVC RV Basal diam:  3.55 cm  IVC diam: 2.57 cm RV Mid diam:    3.17 cm TAPSE (M-mode): 1.2 cm LEFT ATRIUM             Index       RIGHT ATRIUM           Index LA diam:        4.30 cm 1.98 cm/m  RA Area:     30.90 cm LA Vol (A2C):   67.1 ml 30.89 ml/m RA Volume:   111.00 ml 51.11 ml/m LA Vol (A4C):   68.8 ml 31.68 ml/m LA Biplane Vol: 68.0 ml 31.31 ml/m  AORTIC VALVE             Normals LVOT Vmax:   81.10 cm/s LVOT Vmean:  51.400 cm/s 75 cm/s LVOT VTI:    0.140 m     25.3 cm  AORTA                 Normals Ao Root diam: 2.90 cm 31 mm Ao Asc diam:  2.30 cm 31 mm MITRAL VALVE              Normals   TRICUSPID VALVE             Normals MV Area (PHT): 3.77 cm             TR Peak grad:   21.8 mmHg MV PHT:  58.29 msec 55 ms     TR Vmax:        261.00 cm/s 288 cm/s MV Decel Time: 201 msec   187 ms MV E velocity: 123.00 cm/s 103 cm/s SHUNTS                                     Systemic VTI:  0.14 m                                     Systemic Diam: 1.90 cm  Mertie Moores MD Electronically signed by Mertie Moores MD Signature Date/Time: 07/23/2019/5:33:58 PMThe mitral valve is grossly normal.    Final      Phillips Climes M.D on 07/24/2019 at 1:25 PM  Between 7am to 7pm - Pager -  [phone]  After 7pm go to [email or web address] - password [facility]  [facility] -  Office  [phone]

## 2019-07-25 LAB — D-DIMER, QUANTITATIVE: D-Dimer, Quant: 3.65 ug/mL-FEU — ABNORMAL HIGH (ref 0.00–0.50)

## 2019-07-25 LAB — HEPARIN LEVEL (UNFRACTIONATED)
Heparin Unfractionated: 0.24 IU/mL — ABNORMAL LOW (ref 0.30–0.70)
Heparin Unfractionated: 0.28 IU/mL — ABNORMAL LOW (ref 0.30–0.70)

## 2019-07-25 LAB — CBC WITH DIFFERENTIAL/PLATELET
Abs Immature Granulocytes: 0.03 10*3/uL (ref 0.00–0.07)
Basophils Absolute: 0 10*3/uL (ref 0.0–0.1)
Basophils Relative: 0 %
Eosinophils Absolute: 0.1 10*3/uL (ref 0.0–0.5)
Eosinophils Relative: 1 %
HCT: 36 % (ref 36.0–46.0)
Hemoglobin: 11.4 g/dL — ABNORMAL LOW (ref 12.0–15.0)
Immature Granulocytes: 0 %
Lymphocytes Relative: 13 %
Lymphs Abs: 0.9 10*3/uL (ref 0.7–4.0)
MCH: 27.5 pg (ref 26.0–34.0)
MCHC: 31.7 g/dL (ref 30.0–36.0)
MCV: 86.7 fL (ref 80.0–100.0)
Monocytes Absolute: 0.8 10*3/uL (ref 0.1–1.0)
Monocytes Relative: 11 %
Neutro Abs: 5.3 10*3/uL (ref 1.7–7.7)
Neutrophils Relative %: 75 %
Platelets: 196 10*3/uL (ref 150–400)
RBC: 4.15 MIL/uL (ref 3.87–5.11)
RDW: 14.6 % (ref 11.5–15.5)
WBC: 7.2 10*3/uL (ref 4.0–10.5)
nRBC: 0 % (ref 0.0–0.2)

## 2019-07-25 LAB — COMPREHENSIVE METABOLIC PANEL
ALT: 19 U/L (ref 0–44)
AST: 20 U/L (ref 15–41)
Albumin: 2.7 g/dL — ABNORMAL LOW (ref 3.5–5.0)
Alkaline Phosphatase: 57 U/L (ref 38–126)
Anion gap: 8 (ref 5–15)
BUN: 24 mg/dL — ABNORMAL HIGH (ref 8–23)
CO2: 22 mmol/L (ref 22–32)
Calcium: 8.3 mg/dL — ABNORMAL LOW (ref 8.9–10.3)
Chloride: 105 mmol/L (ref 98–111)
Creatinine, Ser: 0.85 mg/dL (ref 0.44–1.00)
GFR calc Af Amer: >60 mL/min (ref >60)
GFR calc non Af Amer: >60 mL/min (ref >60)
Glucose, Bld: 100 mg/dL — ABNORMAL HIGH (ref 70–99)
Potassium: 4.3 mmol/L (ref 3.5–5.1)
Sodium: 135 mmol/L (ref 135–145)
Total Bilirubin: 0.8 mg/dL (ref 0.3–1.2)
Total Protein: 6 g/dL — ABNORMAL LOW (ref 6.5–8.1)

## 2019-07-25 LAB — C-REACTIVE PROTEIN: CRP: 10.4 mg/dL — ABNORMAL HIGH (ref <1.0)

## 2019-07-25 LAB — MAGNESIUM: Magnesium: 2 mg/dL (ref 1.7–2.4)

## 2019-07-25 LAB — FERRITIN: Ferritin: 91 ng/mL (ref 11–307)

## 2019-07-25 MED ORDER — DIGOXIN 0.25 MG/ML IJ SOLN
0.2500 mg | Freq: Four times a day (QID) | INTRAMUSCULAR | Status: AC
  Administered 2019-07-25 (×2): 0.25 mg via INTRAVENOUS
  Filled 2019-07-25 (×2): qty 1

## 2019-07-25 MED ORDER — DEXAMETHASONE 6 MG PO TABS
6.0000 mg | ORAL_TABLET | Freq: Every day | ORAL | Status: DC
  Administered 2019-07-25 – 2019-07-30 (×6): 6 mg via ORAL
  Filled 2019-07-25 (×6): qty 1

## 2019-07-25 MED ORDER — FUROSEMIDE 10 MG/ML IJ SOLN
20.0000 mg | Freq: Once | INTRAMUSCULAR | Status: AC
  Administered 2019-07-25: 20 mg via INTRAVENOUS
  Filled 2019-07-25: qty 2

## 2019-07-25 NOTE — Progress Notes (Addendum)
[place] for heparin infusion Indication: pulmonary embolus  Allergies  Allergen Reactions  . Clarithromycin     Numbness of extremities  . Codeine Other (See Comments)    Caused pain  . Penicillins Rash    Did it involve swelling of the face/tongue/throat, SOB, or low BP? No Did it involve sudden or severe rash/hives, skin peeling, or any reaction on the inside of your mouth or nose? Yes Did you need to seek medical attention at a hospital or doctor's office? Yes When did it last happen?20 years If all above answers are "NO", may proceed with cephalosporin use.     Patient Measurements: Height: 5\' 5"  (165.1 cm) Weight: 249 lb 3.2 oz (113 kg) IBW/kg (Calculated) : 57 Heparin Dosing Weight: HEPARIN DW (KG): 83.8  Vital Signs: Temp: 98.9 F (37.2 C) (01/31 0420) Temp Source: Oral (01/31 0420) BP: 103/59 (01/31 0420) Pulse Rate: 97 (01/31 0420)  Labs: Recent Labs    07/23/19 0651 07/23/19 0651 07/23/19 1027 07/23/19 1955 07/24/19 0302 07/25/19 0221  HGB 11.6*   < >  --   --  11.4* 11.4*  HCT 36.3  --   --   --  35.7* 36.0  PLT 136*  --   --   --  167 196  HEPARINUNFRC >2.20*  --    < > 0.70 0.48 0.24*  CREATININE 0.84  --   --   --  0.86  --    < > = values in this interval not displayed.    Estimated Creatinine Clearance: 69.8 mL/min (by C-G formula based on SCr of 0.86 mg/dL).   Assessment: Pharmacy consulted to dose heparin infusion for this 79 yo female with acute PE and right heart strain. She was started on heparin infusion at [facility] with a bolus of 5000 units and a rate of 1200 units/hr.  07/25/19 0500 UPDATE Heparin level: 0.24 IU/mL, just below therapeutic goal range  RN reports no bleeding complications or issues with infusion site H/H stable;  platelets  up to 196K  Goal of Therapy:  Heparin level 0.3-0.7 units/ml Monitor platelets by anticoagulation protocol: Yes   Plan:  Increase heparin  infusion to  1100 units/hr  Check HL ~8hrs after rate change Check anti-Xa level daily while on heparin Monitor CBC and signs/symptoms of bleeding.  Despina Pole, Pharm. D. Clinical Pharmacist 07/25/2019 5:01 AM

## 2019-07-25 NOTE — Evaluation (Signed)
Physical Therapy Evaluation Patient Details Name: Miranda Moses MRN: [id number] DOB: 03/11/1941 Today's Date: 07/25/2019   History of Present Illness  79 year old female admitted with Covid and extensive PE: PMH pre Diabetes, Pulmonary HTN, chronic diastolic CHF, A FIB.Cleared with RN Isidoro Donning secondary to the PE.- reportedly cleared, medically stable to Eval and treat.  Clinical Impression  Very pleasant and receptive female patient. Lives alone (widowed last year)- uses no AD for ambulation, and no oxygen at home. She relates that because she has a tub-shower combo, she chooses to sponge bathe for safety. Commode height is standard. Has 2 steps outside, 3 in back and a ramped entry with handrails. She was independent in all ADLs including driving. Family lives "behind her house" and "readily available". Should benefit from PT to address goals for optimal functional outcomes. RN Sula Soda did clear her to be evaluated as she has history of PE's this admission- but is medically stable with medication.     Follow Up Recommendations Home health PT    Equipment Recommendations       Recommendations for Other Services       Precautions / Restrictions Precautions Precautions: Fall Precaution Comments: Monitor O2 sats- she was not using any O2 in PLOF- when PT entered room, patient on 1 LPM, [place]- she maintained 96%+ entire session. Restrictions Weight Bearing Restrictions: No      Mobility  Bed Mobility Overal bed mobility: Modified Independent                Transfers Overall transfer level: Needs assistance Equipment used: (has RW in room but has been transferring with 1 person HHA bed to chair) Transfers: Sit to/from Stand Sit to Stand: Min guard;Min assist         General transfer comment: had difficulty from low surface, and had been sitting "a couple of hours"  Ambulation/Gait Ambulation/Gait assistance: Min guard;Min assist Gait Distance (Feet): 7 Feet(tired, but  did  not desat, - wanted to eat lunch) Assistive device: (Did not use RW during this session, and does not use any AD at home.) Gait Pattern/deviations: Shuffle;Wide base of support   Gait velocity interpretation: 1.31 - 2.62 ft/sec, indicative of limited community Conservation officer, historic buildings [place] (Stroke Patients Only)       Balance Overall balance assessment: Mild deficits observed, not formally tested                                           Pertinent Vitals/Pain Pain Assessment: No/denies pain    Home Living Family/patient expects to be discharged to:: Private residence Living Arrangements: Alone Available Help at Discharge: Available 24 hours/day;Family Type of Home: House Home Access: Stairs to enter;Ramped entrance Entrance Stairs-Rails: Can reach both;Right;Left Entrance Stairs-Number of Steps: 2  at side, 3 in front, has a ramped entry too Home Layout: One level   Additional Comments: Did not use any AD in ambulation    Prior Function Level of Independence: Independent         Comments: Tends to sponge bathe as she has safety concerns of climbing into tub now that she lives alone (widowed last year)     Hand Dominance   Dominant Hand: Right    Extremity/Trunk Assessment  Lower Extremity Assessment Lower Extremity Assessment: Generalized weakness    Cervical / Trunk Assessment Cervical / Trunk Assessment: Kyphotic  Communication   Communication: No difficulties  Cognition Arousal/Alertness: Awake/alert Behavior During Therapy: WFL for tasks assessed/performed Overall Cognitive Status: Within Functional Limits for tasks assessed                                 General Comments: Pleasant and receptive to PT      General Comments General comments (skin integrity, edema, etc.): Monitor skin and joint integrity- tone and turgor fair, skin prone to tears and  bruising    Exercises Total Joint Exercises Ankle Circles/Pumps: AROM;Seated Quad Sets: AROM;Seated Short Arc Quad: AROM;Seated Heel Slides: AROM;Seated Hip ABduction/ADduction: AROM;Seated Straight Leg Raises: AROM;Seated Marching in Standing: AROM;Seated Other Exercises Other Exercises: IS and Flutter valve- IS able to achieve 1000, 6 out of 10 attempts. Reviewed proper use of unit. Good return demo with Flutter Valve Other Exercises: Gave printed sheets on LE ex, and also reviewed, Shoulder flexion, abduction and elbow extension for strengthening-   Assessment/Plan    PT Assessment Patient needs continued PT services  PT Problem List Decreased strength;Decreased activity tolerance;Decreased mobility;Cardiopulmonary status limiting activity       PT Treatment Interventions Gait training;Functional mobility training;Therapeutic activities;Therapeutic exercise;Patient/family education    PT Goals (Current goals can be found in the Care Plan section)  Acute Rehab PT Goals Patient Stated Goal: Just to get well and go home. PT Goal Formulation: With patient Time For Goal Achievement: 08/08/19 Potential to Achieve Goals: Good    Frequency Min 3X/week   Barriers to discharge        Co-evaluation               AM-PAC PT "6 Clicks" Mobility  Outcome Measure Help needed turning from your back to your side while in a flat bed without using bedrails?: A Little Help needed moving from lying on your back to sitting on the side of a flat bed without using bedrails?: A Little Help needed moving to and from a bed to a chair (including a wheelchair)?: A Little Help needed standing up from a chair using your arms (e.g., wheelchair or bedside chair)?: A Little Help needed to walk in hospital room?: A Little Help needed climbing 3-5 steps with a railing? : A Lot 6 Click Score: 17    End of Session   Activity Tolerance: Patient tolerated treatment well Patient left: in  chair;with call bell/phone within reach   PT Visit Diagnosis: Muscle weakness (generalized) (M62.81)    Time: [id number] PT Time Calculation (min) (ACUTE ONLY): 40 min   Charges:   PT Evaluation $PT Eval Moderate Complexity: 1 Mod PT Treatments $Therapeutic Exercise: 23-37 mins       Rollen Sox, PT # [phone] CGV cell  Casandra Doffing 07/25/2019, 2:01 PM

## 2019-07-25 NOTE — Progress Notes (Addendum)
PROGRESS NOTE                                                                                                                                                                                                             Patient Demographics:    Miranda Moses, is a 79 y.o. female, DOB - [birth year], [id number]  Admit date - 07/23/2019   Admitting Physician [name], MD  Outpatient Primary MD for the patient is [name], PA-C  LOS - 2   No chief complaint on file.      Brief Narrative     79 y.o. female with medical history significant for hypertension, prediabetes, pulmonary arterial hypertension, chronic diastolic CHF, coronary artery disease, and COVID-19 diagnosed on 07/09/2019, now presenting to the emergency department for evaluation of fatigue, lightheadedness, and malaise.  tested positive for COVID-19 (07/09/2019), reports chronic lower extremity edema. Upon arrival to the ED, patient is found to be afebrile, saturating mid-90's on room air, RR 22, BP 97/57, and heart rate of 140.  EKG with atrial fibrillation, rate 138.  CBC and chem panel unremarkable.  Troponin 0.86 initially, decreased when repeated 4 hours later.  proBNP 4440.  D-dimer elevated and CTA chest concerning for extensive bilateral pulmonary embolism with evidence for right heart strain.  Patient was given a fluid bolus, IV diltiazem load and infusion, and started on IV heparin.  Was transferred to [facility] for further management.   Subjective:    [name] today [place] any chest pain, reports dyspnea is improving, reports increasing urination since she received Lasix .   Assessment  & Plan :    Principal Problem:   Acute pulmonary embolism (HCC) Active Problems:   Hypertension   New onset atrial fibrillation (HCC)   Atrial fibrillation with RVR ([place])   COVID-19 virus infection    Acute pulmonary embolism  - Presents with d-dimer 7925, and CTA chest with extensive  bilateral PE, RV/LV 1.4  - D echo significant for right heart strain with right ventricle moderately dilated and with reduced function. - No chest pain or respiratory distress , but she reports dyspnea. -Troponins elevated on admission, but trending down. -Given significant clot burden, and right heart strain, will continue with heparin gtt for next 24 hours, then will transition to Eliquis .  New-onset atrial fibrillation with RVR  -  Presents in atrial fibrillation with RVR, appears to be new-onset  -He is on Cardizem drip initially, has been stopped, started on metoprolol . - will give digoxin x2 today to help with heart rate control - Likely precipitated by the pulmonary disease  - CHADS-VASc 5 (age x2, CAD, HTN, gender), continue with IV heparin  COVID-19 pneumonia - Initially diagnosed on 07/09/19, confirmed in ED  - CTA chest hazy ground glass opacities   - RR 20, saturation 94% on ra at time of admission  - Continue with remdesivir,  -She is on 2 L nasal cannula this morning, will start on steroids.   COVID-19 Labs  Recent Labs    07/23/19 0651 07/24/19 0302 07/25/19 0221  DDIMER 17.10* 3.80* 3.65*  FERRITIN 138 111 91  CRP 4.7* 5.4* 10.4*    No results found for: SARSCOV2NAA CAD - No angina  - She had been intolerant of statins, continue ASA   Hypertension  - Pressure remains soft, continue to hold bisoprolol and Imdur .  Chronic diastolic CHF  - Appears hypervolemic   - EF 60-65% in April 2020  - Will keep on diuresis as needed, will give 20 mg of IV Lasix today  Code Status : Full  Family communication: D/W daughter via phone 1/30  Disposition Plan  : Home  Barriers For Discharge : IV heparin drip(another 24 to 48 hours), and IV remdesivir  Consults  :  None  Procedures  : None  DVT Prophylaxis  :  Heparin GTT  Lab Results  Component Value Date   PLT 196 07/25/2019    Antibiotics  :    Anti-infectives (From admission, onward)   Start      Dose/Rate Route Frequency Ordered Stop   07/24/19 1000  remdesivir 100 mg in sodium chloride 0.9 % 100 mL IVPB     100 mg 200 mL/hr over 30 Minutes Intravenous Daily 07/23/19 0555 07/28/19 0959   07/23/19 0700  remdesivir 200 mg in sodium chloride 0.9% 250 mL IVPB     200 mg 580 mL/hr over 30 Minutes Intravenous Once 07/23/19 0555 07/23/19 0902        Objective:   Vitals:   07/25/19 0713 07/25/19 0743 07/25/19 1054 07/25/19 1347  BP: 99/76 102/62    Pulse: 94  98   Resp: 18  18   Temp: 98.5 F (36.9 C)  97.7 F (36.5 C)   TempSrc: Oral  Oral   SpO2: 98%  97% 96%  Weight:      Height:        Wt Readings from Last 3 Encounters:  07/23/19 113 kg  04/22/19 107.5 kg  01/01/19 106.7 kg     Intake/Output Summary (Last 24 hours) at 07/25/2019 1352 Last data filed at 07/25/2019 1351 Gross per 24 hour  Intake 495 ml  Output 1550 ml  Net -1055 ml     Physical Exam  Awake Alert, Oriented X 3, No new F.N deficits, Normal affect Symmetrical Chest wall movement, Good air movement bilaterally, CTAB Irregular irregular,No Gallops,Rubs or new Murmurs, No Parasternal Heave +ve B.Sounds, Abd Soft, No tenderness, No rebound - guarding or rigidity. No Cyanosis, Clubbing ,+1 edema, No new Rash or bruise        Data Review:    CBC Recent Labs  Lab 07/23/19 0651 07/24/19 0302 07/25/19 0221  WBC 5.3 6.0 7.2  HGB 11.6* 11.4* 11.4*  HCT 36.3 35.7* 36.0  PLT 136* 167 196  MCV 86.2 86.7 86.7  MCH 27.6 27.7 27.5  MCHC 32.0 31.9 31.7  RDW 14.5 14.5 14.6  LYMPHSABS 0.8 0.7 0.9  MONOABS 0.5 0.6 0.8  EOSABS 0.0 0.0 0.1  BASOSABS 0.0 0.0 0.0    Chemistries  Recent Labs  Lab 07/23/19 0651 07/24/19 0302 07/25/19 0221  NA 135 138 135  K 3.7 3.8 4.3  CL 104 105 105  CO2 18* 23 22  GLUCOSE 107* 99 100*  BUN 24* 26* 24*  CREATININE 0.84 0.86 0.85  CALCIUM 8.0* 8.3* 8.3*  MG 1.9 1.9 2.0  AST 32 30 20  ALT 22 22 19   ALKPHOS 62 59 57  BILITOT 0.8 0.8 0.8    ------------------------------------------------------------------------------------------------------------------ No results for input(s): CHOL, HDL, LDLCALC, TRIG, CHOLHDL, LDLDIRECT in the last 72 hours.  No results found for: HGBA1C ------------------------------------------------------------------------------------------------------------------ Recent Labs    07/23/19 0651  TSH 0.819   ------------------------------------------------------------------------------------------------------------------ Recent Labs    07/24/19 0302 07/25/19 0221  FERRITIN 111 91    Coagulation profile No results for input(s): INR, PROTIME in the last 168 hours.  Recent Labs    07/24/19 0302 07/25/19 0221  DDIMER 3.80* 3.65*    Cardiac Enzymes No results for input(s): CKMB, TROPONINI, MYOGLOBIN in the last 168 hours.  Invalid input(s): CK ------------------------------------------------------------------------------------------------------------------ No results found for: BNP  Inpatient Medications  Scheduled Meds: . aspirin EC  81 mg Oral Daily  . atorvastatin  20 mg Oral Daily  . metoprolol tartrate  50 mg Oral BID  . pantoprazole  40 mg Oral Daily  . sodium chloride flush  3 mL Intravenous Q12H  . sodium chloride flush  3 mL Intravenous Q12H   Continuous Infusions: . sodium chloride    . heparin 1,100 Units/hr (07/25/19 0654)  . remdesivir 100 mg in NS 100 mL 100 mg (07/25/19 0834)   PRN Meds:.sodium chloride, acetaminophen, HYDROcodone-acetaminophen, morphine injection, ondansetron **OR** ondansetron (ZOFRAN) IV, senna-docusate, sodium chloride, sodium chloride flush  Micro Results No results found for this or any previous visit (from the past 240 hour(s)).  Radiology Reports DG Chest Port 1 View  Result Date: 07/24/2019 CLINICAL DATA:  Dyspnea.  COVID-19. EXAM: PORTABLE CHEST 1 VIEW COMPARISON:  07/22/2019 from [facility]. FINDINGS: Patient rotated minimally  right. Mild cardiomegaly. Atherosclerosis in the transverse aorta. No pleural effusion or pneumothorax. No lobar consolidation. Pulmonary interstitial thickening, felt to be accentuated by AP portable technique and diminished lung volumes. Areas of mild left infrahilar scarring are felt to be similar. IMPRESSION: Cardiomegaly with diminished lung volumes, chronic interstitial thickening. No convincing evidence of superimposed pneumonia. Aortic Atherosclerosis (ICD10-I70.0). Electronically Signed   By: Abigail Miyamoto M.D.   On: 07/24/2019 16:03   ECHOCARDIOGRAM LIMITED  Result Date: 07/23/2019   ECHOCARDIOGRAM LIMITED REPORT   Patient Name:   SAYANI Cantu Date of Exam: 07/23/2019 Medical Rec #:  [id number]  Height:       65.0 in Accession #:    [id number] Weight:       249.2 lb Date of Birth:  09-22-1940  BSA:          2.17 m Patient Age:    31 years   BP:           115/91 mmHg Patient Gender: F          HR:           103 bpm. Exam Location:  Inpatient  Procedure: Limited Echo, Cardiac Doppler and Limited Color Doppler Indications:    I26.02 Pulmonary embolus  History:  Patient has prior history of Echocardiogram examinations, most                 recent 10/14/2018.  Sonographer:    Jonelle Sidle Dance Referring Phys: [id number] [place]  1. Left ventricular ejection fraction, by visual estimation, is 55 to 60%.  2. Global right ventricle has moderately reduced systolic function.The right ventricular size is moderately enlarged. no increase in right ventricular wall thickness.  3. The RV is moderately dilated with moderately reduced RV function.  4. Right atrial size was severely dilated.  5. The mitral valve is grossly normal. Mild mitral valve regurgitation.  6. The tricuspid valve was normal in structure. Tricuspid valve regurgitation is mild.  7. Tricuspid valve regurgitation is mild.  8. The pulmonic valve was normal in structure. Pulmonic valve regurgitation is mild by color flow Doppler.  9. Mildly  elevated pulmonary artery systolic pressure. FINDINGS  Left Ventricle: Left ventricular ejection fraction, by visual estimation, is 55 to 60%. Right Ventricle: The right ventricular size is moderately enlarged. No increase in right ventricular wall thickness. Global RV systolic function is has moderately reduced systolic function. The tricuspid regurgitant velocity is 2.33 m/s, and with an assumed right atrial pressure of 15 mmHg, the estimated right ventricular systolic pressure is mildly elevated at 36.8 mmHg. The RV is moderately dilated with moderately reduced RV function. Right Atrium: Right atrial size was severely dilated. Right atrial pressure is estimated at 15 mmHg. Mitral Valve: The mitral valve is grossly normal. MV Area by PHT, 3.77 cm. MV PHT, 58.29 msec. Mild mitral valve regurgitation. Tricuspid Valve: The tricuspid valve is normal in structure. Tricuspid valve regurgitation is mild. Pulmonic Valve: The pulmonic valve was normal in structure. Pulmonic valve regurgitation is mild by color flow Doppler. Pulmonic regurgitation is mild by color flow Doppler. Aorta: The aortic root and ascending aorta are structurally normal, with no evidence of dilitation.  LEFT VENTRICLE          Normals PLAX 2D LVIDd:         3.69 cm  3.6 cm LVIDs:         2.81 cm  1.7 cm LV PW:         1.37 cm  1.4 cm LV IVS:        1.02 cm  1.3 cm LVOT diam:     1.90 cm  2.0 cm LV SV:         28 ml    79 ml LV SV Index:   11.96    45 ml/m2 LVOT Area:     2.84 cm 3.14 cm2  RIGHT VENTRICLE          IVC RV Basal diam:  3.55 cm  IVC diam: 2.57 cm RV Mid diam:    3.17 cm TAPSE (M-mode): 1.2 cm LEFT ATRIUM             Index       RIGHT ATRIUM           Index LA diam:        4.30 cm 1.98 cm/m  RA Area:     30.90 cm LA Vol (A2C):   67.1 ml 30.89 ml/m RA Volume:   111.00 ml 51.11 ml/m LA Vol (A4C):   68.8 ml 31.68 ml/m LA Biplane Vol: 68.0 ml 31.31 ml/m  AORTIC VALVE             Normals LVOT Vmax:   81.10 cm/s LVOT Vmean:  51.400 cm/s  75 cm/s LVOT VTI:    0.140 m     25.3 cm  AORTA                 Normals Ao Root diam: 2.90 cm 31 mm Ao Asc diam:  2.30 cm 31 mm MITRAL VALVE              Normals   TRICUSPID VALVE             Normals MV Area (PHT): 3.77 cm             TR Peak grad:   21.8 mmHg MV PHT:        58.29 msec 55 ms     TR Vmax:        261.00 cm/s 288 cm/s MV Decel Time: 201 msec   187 ms MV E velocity: 123.00 cm/s 103 cm/s SHUNTS                                     Systemic VTI:  0.14 m                                     Systemic Diam: 1.90 cm  Mertie Moores MD Electronically signed by Mertie Moores MD Signature Date/Time: 07/23/2019/5:33:58 PMThe mitral valve is grossly normal.    Final      Phillips Climes M.D on 07/25/2019 at 1:52 PM  Between 7am to 7pm - Pager - [phone]  After 7pm go to [email or web address] - password [facility]  [facility] -  Office  [phone]

## 2019-07-25 NOTE — Plan of Care (Addendum)
Pt up to chair, VSS. Pt is on [facility]. Pt alert and oriented x4. Standby assist. Continue Hep gtt as ordered and IV Remdesivir. PT consult in place. No s/s of distress. Call bell in reach. Will continue to monitor patient.  Problem: Education: Goal: Knowledge of risk factors and measures for prevention of condition will improve Outcome: Progressing   Problem: Coping: Goal: Psychosocial and spiritual needs will be supported Outcome: Progressing   Problem: Respiratory: Goal: Will maintain a patent airway Outcome: Progressing Goal: Complications related to the disease process, condition or treatment will be avoided or minimized Outcome: Progressing

## 2019-07-25 NOTE — Progress Notes (Signed)
Hep gtt rate changed per mar orders

## 2019-07-25 NOTE — Progress Notes (Signed)
ANTICOAGULATION CONSULT NOTE - Follow-Up  Pharmacy Consult for heparin infusion Indication: pulmonary embolus  Allergies  Allergen Reactions  . Clarithromycin     Numbness of extremities  . Codeine Other (See Comments)    Caused pain  . Penicillins Rash    Did it involve swelling of the face/tongue/throat, SOB, or low BP? No Did it involve sudden or severe rash/hives, skin peeling, or any reaction on the inside of your mouth or nose? Yes Did you need to seek medical attention at a hospital or doctor's office? Yes When did it last happen?20 years If all above answers are "NO", may proceed with cephalosporin use.     Patient Measurements: Height: 5\' 5"  (165.1 cm) Weight: 249 lb 3.2 oz (113 kg) IBW/kg (Calculated) : 57 Heparin Dosing Weight: HEPARIN DW (KG): 83.8   Vital Signs: Temp: 97.7 F (36.5 C) (01/31 1054) Temp Source: Oral (01/31 1054) BP: 102/62 (01/31 0743) Pulse Rate: 98 (01/31 1054)  Labs: Recent Labs    07/23/19 0651 07/23/19 1027 07/24/19 0302 07/25/19 0221 07/25/19 1240  HGB 11.6*   < > 11.4* 11.4*  --   HCT 36.3  --  35.7* 36.0  --   PLT 136*  --  167 196  --   HEPARINUNFRC >2.20*   < > 0.48 0.24* 0.28*  CREATININE 0.84  --  0.86 0.85  --    < > = values in this interval not displayed.    Estimated Creatinine Clearance: 70.6 mL/min (by C-G formula based on SCr of 0.85 mg/dL).   Assessment: Pharmacy consulted to dose heparin infusion for this 79 yo female with acute PE and right heart strain. She was started on heparin infusion at [facility] on 1/29.    Heparin level remains subtherapeutic (but trending up) on 1100 units/hr. RN reports no bleeding complications or issues with infusion site H/H stable;  platelets  up to 196K  Goal of Therapy:  Heparin level 0.3-0.7 units/ml Monitor platelets by anticoagulation protocol: Yes   Plan:  Increase heparin infusion to 1250 units/hr  Next heparin level with AM labs 2/1 Check heparin  level and CBC daily while on heparin Monitor CBC and signs/symptoms of bleeding.  [employer], Pharm.D., BCPS Clinical Pharmacist  07/25/2019 2:39 PM

## 2019-07-26 LAB — CBC WITH DIFFERENTIAL/PLATELET
Abs Immature Granulocytes: 0.06 10*3/uL (ref 0.00–0.07)
Basophils Absolute: 0 10*3/uL (ref 0.0–0.1)
Basophils Relative: 0 %
Eosinophils Absolute: 0 10*3/uL (ref 0.0–0.5)
Eosinophils Relative: 0 %
HCT: 40.4 % (ref 36.0–46.0)
Hemoglobin: 12.9 g/dL (ref 12.0–15.0)
Immature Granulocytes: 1 %
Lymphocytes Relative: 8 %
Lymphs Abs: 0.6 10*3/uL — ABNORMAL LOW (ref 0.7–4.0)
MCH: 27.6 pg (ref 26.0–34.0)
MCHC: 31.9 g/dL (ref 30.0–36.0)
MCV: 86.5 fL (ref 80.0–100.0)
Monocytes Absolute: 0.3 10*3/uL (ref 0.1–1.0)
Monocytes Relative: 4 %
Neutro Abs: 7.1 10*3/uL (ref 1.7–7.7)
Neutrophils Relative %: 87 %
Platelets: 247 10*3/uL (ref 150–400)
RBC: 4.67 MIL/uL (ref 3.87–5.11)
RDW: 14.3 % (ref 11.5–15.5)
WBC: 8.2 10*3/uL (ref 4.0–10.5)
nRBC: 0 % (ref 0.0–0.2)

## 2019-07-26 LAB — COMPREHENSIVE METABOLIC PANEL
ALT: 17 U/L (ref 0–44)
AST: 18 U/L (ref 15–41)
Albumin: 2.9 g/dL — ABNORMAL LOW (ref 3.5–5.0)
Alkaline Phosphatase: 64 U/L (ref 38–126)
Anion gap: 8 (ref 5–15)
BUN: 25 mg/dL — ABNORMAL HIGH (ref 8–23)
CO2: 23 mmol/L (ref 22–32)
Calcium: 9.1 mg/dL (ref 8.9–10.3)
Chloride: 105 mmol/L (ref 98–111)
Creatinine, Ser: 0.79 mg/dL (ref 0.44–1.00)
GFR calc Af Amer: >60 mL/min (ref >60)
GFR calc non Af Amer: >60 mL/min (ref >60)
Glucose, Bld: 160 mg/dL — ABNORMAL HIGH (ref 70–99)
Potassium: 4.9 mmol/L (ref 3.5–5.1)
Sodium: 136 mmol/L (ref 135–145)
Total Bilirubin: 0.8 mg/dL (ref 0.3–1.2)
Total Protein: 6.5 g/dL (ref 6.5–8.1)

## 2019-07-26 LAB — C-REACTIVE PROTEIN: CRP: 14.4 mg/dL — ABNORMAL HIGH (ref <1.0)

## 2019-07-26 LAB — HEPARIN LEVEL (UNFRACTIONATED): Heparin Unfractionated: 0.69 IU/mL (ref 0.30–0.70)

## 2019-07-26 LAB — MAGNESIUM: Magnesium: 2 mg/dL (ref 1.7–2.4)

## 2019-07-26 LAB — BRAIN NATRIURETIC PEPTIDE: B Natriuretic Peptide: 633.2 pg/mL — ABNORMAL HIGH (ref 0.0–100.0)

## 2019-07-26 LAB — FERRITIN: Ferritin: 97 ng/mL (ref 11–307)

## 2019-07-26 LAB — D-DIMER, QUANTITATIVE: D-Dimer, Quant: 3.67 ug/mL-FEU — ABNORMAL HIGH (ref 0.00–0.50)

## 2019-07-26 MED ORDER — FUROSEMIDE 10 MG/ML IJ SOLN
20.0000 mg | Freq: Two times a day (BID) | INTRAMUSCULAR | Status: AC
  Administered 2019-07-26 (×2): 20 mg via INTRAVENOUS
  Filled 2019-07-26 (×2): qty 2

## 2019-07-26 MED ORDER — APIXABAN 5 MG PO TABS
10.0000 mg | ORAL_TABLET | Freq: Two times a day (BID) | ORAL | Status: DC
  Administered 2019-07-26 – 2019-07-30 (×9): 10 mg via ORAL
  Filled 2019-07-26 (×9): qty 2

## 2019-07-26 NOTE — Progress Notes (Signed)
Physical Therapy Treatment Patient Details Name: Miranda Moses MRN: [id number] DOB: February 21, 1941 Today's Date: 07/26/2019    History of Present Illness 79 year old female admitted with Covid and extensive PE: PMH pre Diabetes, Pulmonary HTN, chronic diastolic CHF, A FIB.Cleared with RN Isidoro Donning secondary to the PE.- reportedly cleared, medically stable to Eval and treat.    PT Comments    Very pleasant female patient, cooperative with PT. Twice now however, she has been incontinent and literally sat in urine , or was lying in urine in bed(previous day). RN notified. She says she knows when she needs to go, but "does not want to bother anyone). She was able to stand for several minutes with RW, while RN and PT helped her change gown, RN replaced her [place], and replaced linens in chair. She practiced IS, Flutter valve with good return demos. She was reminded to perform her UE/LE ex in between sessions. Remains generally weak, but improving. Should continue to benefit from PT to further address goals with focus on strength, postural awareness, energy conservation, and gait safety to reduce risk of falls.    Follow Up Recommendations  Home health PT     Equipment Recommendations       Recommendations for Other Services       Precautions / Restrictions Precautions Precautions: Fall Precaution Comments: Monitor SpO2 & HR Restrictions Weight Bearing Restrictions: No    Mobility  Bed Mobility Overal bed mobility: Needs Assistance Bed Mobility: Supine to Sit     Supine to sit: Supervision     General bed mobility comments: HOB elevated, use of bedrail. Supervision for line management  Transfers Overall transfer level: Needs assistance Equipment used: Rolling walker (2 wheeled) Transfers: Stand Pivot Transfers;Sit to/from Stand Sit to Stand: Min guard Stand pivot transfers: Min guard       General transfer comment: Noted 0 instances of LOB, however pt unsteady on  feet.  Ambulation/Gait Ambulation/Gait assistance: Min guard;Min assist Gait Distance (Feet): 10 Feet   Gait Pattern/deviations: Shuffle;Wide base of support   Gait velocity interpretation: 1.31 - 2.62 ft/sec, indicative of limited community ambulator General Gait Details: She stood up for 6 minutes to allow cleaning of chair , changing out her gown, and RN replaced the [employer]             Wheelchair Mobility    Modified Rankin (Stroke Patients Only)       Balance Overall balance assessment: Mild deficits observed, not formally tested                                          Cognition Arousal/Alertness: Awake/alert Behavior During Therapy: WFL for tasks assessed/performed Overall Cognitive Status: Within Functional Limits for tasks assessed                                 General Comments: Of interest, patient was seated in chair when PT arrived, but relates that she had urinated (has [place])- and her chair linens, panties and pad were soaked. RN notified      Exercises General Exercises - Lower Extremity Ankle Circles/Pumps: AROM;Seated Short Arc Quad: AROM;Seated;Both Long Arc Quad: Both Hip ABduction/ADduction: AROM;Seated;Both Other Exercises Other Exercises: Encouraged pursed lip breathing throughout Other Exercises: Reminded to perform all UE and LE ex as issued during Evaluation.  General Comments General comments (skin integrity, edema, etc.): Continue to monitor SPO2. Of interest, she relates she knows when she needs to used the bathroom but "does not want to bother the nurses". We discussed that "we are here to care for her and it is not healthy to just go to the bathroom in the bed or chair and not tell anyone"      Pertinent Vitals/Pain Pain Assessment: No/denies pain    Home Living                      Prior Function            PT Goals (current goals can now be found in the care plan  section) Acute Rehab PT Goals Patient Stated Goal: to go home PT Goal Formulation: With patient Time For Goal Achievement: 08/08/19 Potential to Achieve Goals: Good Progress towards PT goals: Progressing toward goals    Frequency    Min 3X/week      PT Plan      Co-evaluation              AM-PAC PT "6 Clicks" Mobility   Outcome Measure  Help needed turning from your back to your side while in a flat bed without using bedrails?: A Little Help needed moving from lying on your back to sitting on the side of a flat bed without using bedrails?: A Little Help needed moving to and from a bed to a chair (including a wheelchair)?: A Little Help needed standing up from a chair using your arms (e.g., wheelchair or bedside chair)?: A Little Help needed to walk in hospital room?: A Little Help needed climbing 3-5 steps with a railing? : A Lot 6 Click Score: 17    End of Session   Activity Tolerance: Patient tolerated treatment well Patient left: in chair;with call bell/phone within reach   PT Visit Diagnosis: Muscle weakness (generalized) (M62.81)     Time: 0135-0215 PT Time Calculation (min) (ACUTE ONLY): 40 min  Charges:  $Therapeutic Exercise: 8-22 mins $Therapeutic Activity: 23-37 mins                    Rollen Sox, PT # [phone] CGV cell  Casandra Doffing 07/26/2019, 4:17 PM

## 2019-07-26 NOTE — TOC Initial Note (Signed)
Transition of Care [facility]) - Initial/Assessment Note    Patient Details  Name: Miranda Moses MRN: [id number] Date of Birth: 01/08/1941  Transition of Care [facility]) CM/SW Contact:    Ross Ludwig, LCSW Phone Number: 07/26/2019, 4:01 PM  Clinical Narrative:                  Patient is a 79 year old female who lives alone.  Patient is alert and oriented x4.  Patient lives alone, however her daughter will be staying with her for awhile after patient is discharged from the hospital.  CSW spoke to patient's daughter, and asked if they had a preference for home health agency, and daughter said no.  CSW was informed that [facility] can accept patient with [facility] PT, RN, and OT.  Expected Discharge Plan: [place] Barriers to Discharge: Continued Medical Work up   Patient Goals and CMS Choice Patient states their goals for this hospitalization and ongoing recovery are:: To return back home with home health. CMS Medicare.gov Compare Post Acute Care list provided to:: Patient Represenative (must comment) Choice offered to / list presented to : Adult Children  Expected Discharge Plan and Services Expected Discharge Plan: [place] Choice: [place] arrangements for the past 2 months: Single Family Home                 DME Arranged: N/A DME Agency: NA       HH Arranged: RN, PT, OT HH Agency: [place] Date [place]: 07/26/19 Time HH Agency Contacted: 49 Representative spoke with at [place]: [place] Arrangements/Services Living arrangements for the past 2 months: [place] with:: Self Patient language and need for interpreter reviewed:: No Do you feel safe going back to the place where you live?: Yes      Need for Family Participation in Patient Care: Yes (Comment) Care giver support system in place?: No (comment)   Criminal Activity/Legal Involvement Pertinent  to Current Situation/Hospitalization: No - Comment as needed  Activities of Daily Living Home Assistive Devices/Equipment: None ADL Screening (condition at time of admission) Patient's cognitive ability adequate to safely complete daily activities?: Yes Is the patient deaf or have difficulty hearing?: No Does the patient have difficulty seeing, even when wearing glasses/contacts?: No Does the patient have difficulty concentrating, remembering, or making decisions?: No Patient able to express need for assistance with ADLs?: Yes Does the patient have difficulty dressing or bathing?: No Independently performs ADLs?: Yes (appropriate for developmental age) Does the patient have difficulty walking or climbing stairs?: No Weakness of Legs: None Weakness of Arms/Hands: None  Permission Sought/Granted Permission sought to share information with : Family Supports Permission granted to share information with : Yes, Release of Information Signed  Share Information with NAME: Britt,Cheryl Daughter   [phone]  Permission granted to share info w AGENCY: [place]        Emotional Assessment Appearance:: Appears stated age   Affect (typically observed): Accepting, Appropriate, Calm, Stable Orientation: : Oriented to Self, Oriented to  Time, Oriented to Place, Oriented to Situation Alcohol / Substance Use: Never Used Psych Involvement: No (comment)  Admission diagnosis:  Acute pulmonary embolism ([place]) [I26.99] Patient Active Problem List   Diagnosis Date Noted  . Hypertension   . New onset atrial fibrillation ([place])   . Atrial fibrillation with RVR ([place])   . Acute  pulmonary embolism ([place])   . COVID-19 virus infection   . PAH (pulmonary artery hypertension) ([place]) 08/28/2018  . PAD (peripheral artery disease) ([place]) 08/28/2018  . Angina pectoris ([place]) 02/18/2018  . Mixed dyslipidemia 02/18/2018  . Family history of coronary artery disease 01/30/2018  . Hypertensive heart  disease with heart failure ([place]) 01/30/2018  . Gastro-esophageal reflux disease without esophagitis 01/30/2018   PCP:  [name], PA-C Pharmacy:   [facility] [place] [place] [address] Phone: [phone] Fax: [place], [place] [address] [place] [address] Phone: [phone] Fax: [phone]     Social Determinants of Health (SDOH) Interventions    Readmission Risk Interventions No flowsheet data found.

## 2019-07-26 NOTE — Progress Notes (Addendum)
ANTICOAGULATION CONSULT NOTE - Follow-Up  Pharmacy Consult for heparin infusion Indication: pulmonary embolus  Allergies  Allergen Reactions  . Clarithromycin     Numbness of extremities  . Codeine Other (See Comments)    Caused pain  . Penicillins Rash    Did it involve swelling of the face/tongue/throat, SOB, or low BP? No Did it involve sudden or severe rash/hives, skin peeling, or any reaction on the inside of your mouth or nose? Yes Did you need to seek medical attention at a hospital or doctor's office? Yes When did it last happen?20 years If all above answers are "NO", may proceed with cephalosporin use.     Patient Measurements: Height: 5\' 5"  (165.1 cm) Weight: 249 lb 3.2 oz (113 kg) IBW/kg (Calculated) : 57 Heparin Dosing Weight: HEPARIN DW (KG): 83.8   Vital Signs: Temp: 98.6 F (37 C) (01/31 2017) Temp Source: Oral (01/31 2017) BP: 109/71 (02/01 0000) Pulse Rate: 96 (02/01 0000)  Labs: Recent Labs    07/23/19 0651 07/23/19 1027 07/24/19 0302 07/24/19 0302 07/25/19 0221 07/25/19 1240 07/26/19 0353  HGB 11.6*   < > 11.4*   < > 11.4*  --  12.9  HCT 36.3   < > 35.7*  --  36.0  --  40.4  PLT 136*   < > 167  --  196  --  247  HEPARINUNFRC >2.20*   < > 0.48   < > 0.24* 0.28* 0.69  CREATININE 0.84  --  0.86  --  0.85  --   --    < > = values in this interval not displayed.    Estimated Creatinine Clearance: 70.6 mL/min (by C-G formula based on SCr of 0.85 mg/dL).   Assessment: Pharmacy consulted to dose heparin infusion for this 79 yo female with acute PE and right heart strain. She was started on heparin infusion at [facility] on 1/29.    07/26/19 0645 Heparin level: 0.69 IU/mL--> now therapeutic (but at high end of range) on 1100 units/hr. RN reports no bleeding complications or issues with infusion site CBC: Hb 12.9   Plates 247K  Goal of Therapy:  Heparin level 0.3-0.7 units/ml Monitor platelets by anticoagulation protocol: Yes    Plan:  Continue hIeparin infusion at 1250 units/hr   Plan is to transition to Eliquis today Check heparin level and CBC daily while on heparin Monitor CBC and signs/symptoms of bleeding.  Despina Pole, Pharm. D. Clinical Pharmacist 07/26/2019 6:49 AM   AM: Transition to Eliquis Thank you Anette Guarneri, PharmD

## 2019-07-26 NOTE — Progress Notes (Signed)
Occupational Therapy Evaluation Patient Details Name: Miranda Moses MRN: [id number] DOB: Jan 30, 1941 Today's Date: 07/26/2019    History of Present Illness 79 year old female admitted with Covid and extensive PE: PMH pre Diabetes, Pulmonary HTN, chronic diastolic CHF, A FIB.Cleared with RN Isidoro Donning secondary to the PE.- reportedly cleared, medically stable to Eval and treat.   Clinical Impression   PTA pt lived alone, independent in all ADL, IADL, and mobility tasks. Pt does not ambulate with an assistive device and reports 0 falls in the last 6 months. Pt still drives. Pt currently independent to min assist for self-care and functional transfer tasks. Pt tolerated sitting edge of bed ~15+ min to complete dressing and sponge bathing task. Pt able to ambulate to/from bathroom with RW and min guard. Noted 0 instances of loss of balance, however pt unsteady on feet. Pt on room air with SpO2 maintaining in 90s throughout. HR fluctuated between 95-150. MD and RN aware. Pt reported moderate shortness of breath with activity. Pt demonstrates decreased strength, endurance, balance, standing tolerance, and activity tolerance impacting ability to complete self-care and functional transfer tasks. Recommend skilled OT services to address above deficits in order to promote function and prevent further decline. Recommend [place] OT services for continued rehab following hospital discharge.     Follow Up Recommendations  Home health OT;Supervision/Assistance - 24 hour    Equipment Recommendations  None recommended by OT    Recommendations for Other Services       Precautions / Restrictions Precautions Precautions: Fall;Other (comment) Precaution Comments: Monitor SpO2 & HR Restrictions Weight Bearing Restrictions: No      Mobility Bed Mobility Overal bed mobility: Needs Assistance Bed Mobility: Supine to Sit     Supine to sit: Supervision     General bed mobility comments: HOB elevated, use of  bedrail. Supervision for line management  Transfers Overall transfer level: Needs assistance Equipment used: Rolling walker (2 wheeled) Transfers: Stand Pivot Transfers;Sit to/from Stand Sit to Stand: Min guard Stand pivot transfers: Min guard       General transfer comment: Noted 0 instances of LOB, however pt unsteady on feet.    Balance Overall balance assessment: Mild deficits observed, not formally tested                                         ADL either performed or assessed with clinical judgement   ADL Overall ADL's : Needs assistance/impaired Eating/Feeding: Independent;Sitting   Grooming: Set up;Supervision/safety;Sitting   Upper Body Bathing: Supervision/ safety;Set up;Sitting   Lower Body Bathing: Min guard;Minimal assistance;Sit to/from stand   Upper Body Dressing : Set up;Supervision/safety;Sitting   Lower Body Dressing: Minimal assistance;Sit to/from stand   Toilet Transfer: Min guard;Minimal assistance;Ambulation;Regular Toilet;Grab bars   Toileting- Clothing Manipulation and Hygiene: Min guard;Minimal assistance;Sit to/from stand       Functional mobility during ADLs: Min guard;Rolling walker General ADL Comments: Pt able to ambulate to/from bathroom with RW and min guard. Noted 0 instances of LOB, however pt unsteady on feet.     Vision Baseline Vision/History: Wears glasses Wears Glasses: At all times       Perception     Praxis      Pertinent Vitals/Pain Pain Assessment: No/denies pain     Hand Dominance Left   Extremity/Trunk Assessment Upper Extremity Assessment Upper Extremity Assessment: Generalized weakness   Lower Extremity Assessment Lower Extremity Assessment: Defer  to PT evaluation       Communication Communication Communication: No difficulties   Cognition Arousal/Alertness: Awake/alert Behavior During Therapy: WFL for tasks assessed/performed Overall Cognitive Status: Within Functional Limits for  tasks assessed                                 General Comments: A&O x 4   General Comments  Pt on room air with SpO2 maintaining in 90s throughout. HR fluctuated between 95-150. MD and RN aware.    Exercises Exercises: Other exercises Other Exercises Other Exercises: Encouraged pursed lip breathing throughout   Shoulder Instructions      Home Living Family/patient expects to be discharged to:: Private residence Living Arrangements: Alone Available Help at Discharge: Available 24 hours/day;Family Type of Home: House Home Access: Stairs to enter;Ramped entrance Entrance Stairs-Number of Steps: 2  at side, 3 in front, has a ramped entry too Entrance Stairs-Rails: Can reach both;Right;Left Home Layout: One level     Bathroom Shower/Tub: Tub/shower unit(pt sponge bathes)   Bathroom Toilet: Standard     Home Equipment: Cane - single point;Walker - 2 wheels   Additional Comments: Pt reports that children live on her property and can assist her at home.      Prior Functioning/Environment Level of Independence: Independent        Comments: Pt independent in ADL, IADL, and mobility tasks. Pt does not ambulate with an assistive device and reports 0 falls in the last 6 months. Pt still drives. Pt does not use oxygen at home.        OT Problem List: Decreased strength;Decreased activity tolerance;Impaired balance (sitting and/or standing);Cardiopulmonary status limiting activity      OT Treatment/Interventions: Self-care/ADL training;Neuromuscular education;Therapeutic exercise;Energy conservation;DME and/or AE instruction;Therapeutic activities;Patient/family education;Balance training    OT Goals(Current goals can be found in the care plan section) Acute Rehab OT Goals Patient Stated Goal: to go home Time For Goal Achievement: 08/09/19 Potential to Achieve Goals: Good ADL Goals Pt Will Perform Grooming: with modified independence;standing Pt Will Perform  Lower Body Bathing: with modified independence;sit to/from stand Pt Will Perform Lower Body Dressing: with modified independence;sit to/from stand Pt Will Transfer to Toilet: with modified independence;ambulating;regular height toilet Pt Will Perform Toileting - Clothing Manipulation and hygiene: with modified independence;sit to/from stand Additional ADL Goal #1: Pt to recall and verbalize 3 fall prevention strategies with 0 verbal cues. Additional ADL Goal #2: Pt to recall and verbalize 3 energy conservation strategies with 0 verbal cues. Additional ADL Goal #3: Pt to tolerate standing up to 10 min with modified independence, in preparation for ADLs.  OT Frequency: Min 3X/week   Barriers to D/C:            Co-evaluation              AM-PAC OT "6 Clicks" Daily Activity     Outcome Measure Help from another person eating meals?: None Help from another person taking care of personal grooming?: A Little Help from another person toileting, which includes using toliet, bedpan, or urinal?: A Little Help from another person bathing (including washing, rinsing, drying)?: A Little Help from another person to put on and taking off regular upper body clothing?: A Little Help from another person to put on and taking off regular lower body clothing?: A Little 6 Click Score: 19   End of Session Equipment Utilized During Treatment: Rolling walker Nurse Communication: Mobility status  Activity Tolerance: Patient limited by fatigue Patient left: in chair;with call bell/phone within reach;with nursing/sitter in room  OT Visit Diagnosis: Unsteadiness on feet (R26.81);Muscle weakness (generalized) (M62.81)                Time: [id number] OT Time Calculation (min): 46 min Charges:  OT General Charges $OT Visit: 1 Visit OT Evaluation $OT Eval Moderate Complexity: 1 Mod OT Treatments $Self Care/Home Management : 23-37 mins  Mauri Brooklyn OTR/L [phone]  Mauri Brooklyn 07/26/2019, 9:36  AM

## 2019-07-26 NOTE — Progress Notes (Signed)
PROGRESS NOTE                                                                                                                                                                                                             Patient Demographics:    Miranda Moses, is a 79 y.o. female, DOB - 1941-04-28, [id number]  Admit date - 07/23/2019   Admitting Physician [name], MD  Outpatient Primary MD for the patient is [name], PA-C  LOS - 3   No chief complaint on file.      Brief Narrative     79 y.o. female with medical history significant for hypertension, prediabetes, pulmonary arterial hypertension, chronic diastolic CHF, coronary artery disease, and COVID-19 diagnosed on 07/09/2019, now presenting to the emergency department for evaluation of fatigue, lightheadedness, and malaise.  tested positive for COVID-19 (07/09/2019), reports chronic lower extremity edema. Upon arrival to the ED, patient is found to be afebrile, saturating mid-90's on room air, RR 22, BP 97/57, and heart rate of 140.  EKG with atrial fibrillation, rate 138.  CBC and chem panel unremarkable.  Troponin 0.86 initially, decreased when repeated 4 hours later.  proBNP 4440.  D-dimer elevated and CTA chest concerning for extensive bilateral pulmonary embolism with evidence for right heart strain.  Patient was given a fluid bolus, IV diltiazem load and infusion, and started on IV heparin.  Was transferred to [facility] for further management.   Subjective:    [name] today denies any chest pain, reports dyspnea is improving.   Assessment  & Plan :    Principal Problem:   Acute pulmonary embolism (HCC) Active Problems:   Hypertension   New onset atrial fibrillation (HCC)   Atrial fibrillation with RVR ([place])   COVID-19 virus infection    Acute pulmonary embolism  - Presents with d-dimer 7925, and CTA chest with extensive bilateral PE, RV/LV 1.4  - D echo significant for right  heart strain with right ventricle moderately dilated and with reduced function. - No chest pain or respiratory distress , but she reports dyspnea. -Troponins elevated on admission, but trending down. -Given significant clot burden, was treated with heparin GTT for 72 hours, currently transitioned to Eliquis .  New-onset atrial fibrillation with RVR  - Presents in atrial fibrillation with RVR, appears to be new-onset  - she  is on Cardizem drip initially, has been stopped, started on metoprolol , heart rate has been improving overall, she did require some digoxin yesterday, remains in RVR with activity. - Likely precipitated by the pulmonary disease  - CHADS-VASc 5 (age x2, CAD, HTN, gender), continue with IV heparin  COVID-19 pneumonia - Initially diagnosed on 07/09/19, confirmed in ED  - CTA chest hazy ground glass opacities   - RR 20, saturation 94% on ra at time of admission  - Continue with remdesivir,  -Continue with IV steroids.   COVID-19 Labs  Recent Labs    07/24/19 0302 07/25/19 0221 07/26/19 0353  DDIMER 3.80* 3.65* 3.67*  FERRITIN 111 91 97  CRP 5.4* 10.4* 14.4*    No results found for: SARSCOV2NAA CAD - No angina  - She had been intolerant of statins, continue ASA  and beta-blockers  Hypertension  - Pressure remains soft, continue to hold home medications  Chronic diastolic CHF  - Appears hypervolemic  , she is with elevated BNP - EF 60-65% in April 2020  - Continue with gentle diuresis given soft blood pressure  Code Status : Full  Family communication: D/W daughter via phone 1/30  Disposition Plan  : Home  Barriers For Discharge : IV heparin drip(another 24 to 48 hours), and IV remdesivir  Consults  :  None  Procedures  : None  DVT Prophylaxis  :  Heparin GTT  Lab Results  Component Value Date   PLT 247 07/26/2019    Antibiotics  :    Anti-infectives (From admission, onward)   Start     Dose/Rate Route Frequency Ordered Stop    07/24/19 1000  remdesivir 100 mg in sodium chloride 0.9 % 100 mL IVPB     100 mg 200 mL/hr over 30 Minutes Intravenous Daily 07/23/19 0555 07/28/19 0959   07/23/19 0700  remdesivir 200 mg in sodium chloride 0.9% 250 mL IVPB     200 mg 580 mL/hr over 30 Minutes Intravenous Once 07/23/19 0555 07/23/19 0902        Objective:   Vitals:   07/25/19 1915 07/25/19 2017 07/26/19 0000 07/26/19 0750  BP:  119/86 109/71 114/83  Pulse: 93 (!) 110 96 (!) 49  Resp:  20  (!) 22  Temp:  98.6 F (37 C)  97.9 F (36.6 C)  TempSrc:  Oral  Oral  SpO2: 97% 97% 97% 97%  Weight:      Height:        Wt Readings from Last 3 Encounters:  07/23/19 113 kg  04/22/19 107.5 kg  01/01/19 106.7 kg     Intake/Output Summary (Last 24 hours) at 07/26/2019 1136 Last data filed at 07/26/2019 0755 Gross per 24 hour  Intake 688.27 ml  Output 1500 ml  Net -811.73 ml     Physical Exam  Awake Alert, Oriented X 3, No new F.N deficits, Normal affect Symmetrical Chest wall movement, Good air movement bilaterally, CTAB irr irr ,No Gallops,Rubs or new Murmurs, No Parasternal Heave +ve B.Sounds, Abd Soft, No tenderness, No rebound - guarding or rigidity. No Cyanosis, Clubbing ,+1 edema, No new Rash or bruise        Data Review:    CBC Recent Labs  Lab 07/23/19 0651 07/24/19 0302 07/25/19 0221 07/26/19 0353  WBC 5.3 6.0 7.2 8.2  HGB 11.6* 11.4* 11.4* 12.9  HCT 36.3 35.7* 36.0 40.4  PLT 136* 167 196 247  MCV 86.2 86.7 86.7 86.5  MCH 27.6 27.7 27.5 27.6  MCHC 32.0 31.9 31.7 31.9  RDW 14.5 14.5 14.6 14.3  LYMPHSABS 0.8 0.7 0.9 0.6*  MONOABS 0.5 0.6 0.8 0.3  EOSABS 0.0 0.0 0.1 0.0  BASOSABS 0.0 0.0 0.0 0.0    Chemistries  Recent Labs  Lab 07/23/19 0651 07/24/19 0302 07/25/19 0221 07/26/19 0353  NA 135 138 135 136  K 3.7 3.8 4.3 4.9  CL 104 105 105 105  CO2 18* 23 22 23   GLUCOSE 107* 99 100* 160*  BUN 24* 26* 24* 25*  CREATININE 0.84 0.86 0.85 0.79  CALCIUM 8.0* 8.3* 8.3* 9.1  MG 1.9  1.9 2.0 2.0  AST 32 30 20 18   ALT 22 22 19 17   ALKPHOS 62 59 57 64  BILITOT 0.8 0.8 0.8 0.8   ------------------------------------------------------------------------------------------------------------------ No results for input(s): CHOL, HDL, LDLCALC, TRIG, CHOLHDL, LDLDIRECT in the last 72 hours.  No results found for: HGBA1C ------------------------------------------------------------------------------------------------------------------ No results for input(s): TSH, T4TOTAL, T3FREE, THYROIDAB in the last 72 hours.  Invalid input(s): FREET3 ------------------------------------------------------------------------------------------------------------------ Recent Labs    07/25/19 0221 07/26/19 0353  FERRITIN 91 97    Coagulation profile No results for input(s): INR, PROTIME in the last 168 hours.  Recent Labs    07/25/19 0221 07/26/19 0353  DDIMER 3.65* 3.67*    Cardiac Enzymes No results for input(s): CKMB, TROPONINI, MYOGLOBIN in the last 168 hours.  Invalid input(s): CK ------------------------------------------------------------------------------------------------------------------    Component Value Date/Time   BNP 633.2 (H) 07/26/2019 0430    Inpatient Medications  Scheduled Meds: . apixaban  10 mg Oral BID  . aspirin EC  81 mg Oral Daily  . atorvastatin  20 mg Oral Daily  . dexamethasone  6 mg Oral Daily  . furosemide  20 mg Intravenous Q12H  . metoprolol tartrate  50 mg Oral BID  . pantoprazole  40 mg Oral Daily  . sodium chloride flush  3 mL Intravenous Q12H  . sodium chloride flush  3 mL Intravenous Q12H   Continuous Infusions: . sodium chloride    . remdesivir 100 mg in NS 100 mL 100 mg (07/26/19 0935)   PRN Meds:.sodium chloride, acetaminophen, HYDROcodone-acetaminophen, morphine injection, ondansetron **OR** ondansetron (ZOFRAN) IV, senna-docusate, sodium chloride, sodium chloride flush  Micro Results No results found for this or any  previous visit (from the past 240 hour(s)).  Radiology Reports DG Chest Port 1 View  Result Date: 07/24/2019 CLINICAL DATA:  Dyspnea.  COVID-19. EXAM: PORTABLE CHEST 1 VIEW COMPARISON:  07/22/2019 from [facility]. FINDINGS: Patient rotated minimally right. Mild cardiomegaly. Atherosclerosis in the transverse aorta. No pleural effusion or pneumothorax. No lobar consolidation. Pulmonary interstitial thickening, felt to be accentuated by AP portable technique and diminished lung volumes. Areas of mild left infrahilar scarring are felt to be similar. IMPRESSION: Cardiomegaly with diminished lung volumes, chronic interstitial thickening. No convincing evidence of superimposed pneumonia. Aortic Atherosclerosis (ICD10-I70.0). Electronically Signed   By: Abigail Miyamoto M.D.   On: 07/24/2019 16:03   ECHOCARDIOGRAM LIMITED  Result Date: 07/23/2019   ECHOCARDIOGRAM LIMITED REPORT   Patient Name:   MERIAN Duff Date of Exam: 07/23/2019 Medical Rec #:  [id number]  Height:       65.0 in Accession #:    [id number] Weight:       249.2 lb Date of Birth:  1941-04-23  BSA:          2.17 m Patient Age:    78 years   BP:           115/91 mmHg Patient  Gender: F          HR:           103 bpm. Exam Location:  Inpatient  Procedure: Limited Echo, Cardiac Doppler and Limited Color Doppler Indications:    I26.02 Pulmonary embolus  History:        Patient has prior history of Echocardiogram examinations, most                 recent 10/14/2018.  Sonographer:    Jonelle Sidle Dance Referring Phys: [id number] [place]  1. Left ventricular ejection fraction, by visual estimation, is 55 to 60%.  2. Global right ventricle has moderately reduced systolic function.The right ventricular size is moderately enlarged. no increase in right ventricular wall thickness.  3. The RV is moderately dilated with moderately reduced RV function.  4. Right atrial size was severely dilated.  5. The mitral valve is grossly normal. Mild mitral valve  regurgitation.  6. The tricuspid valve was normal in structure. Tricuspid valve regurgitation is mild.  7. Tricuspid valve regurgitation is mild.  8. The pulmonic valve was normal in structure. Pulmonic valve regurgitation is mild by color flow Doppler.  9. Mildly elevated pulmonary artery systolic pressure. FINDINGS  Left Ventricle: Left ventricular ejection fraction, by visual estimation, is 55 to 60%. Right Ventricle: The right ventricular size is moderately enlarged. No increase in right ventricular wall thickness. Global RV systolic function is has moderately reduced systolic function. The tricuspid regurgitant velocity is 2.33 m/s, and with an assumed right atrial pressure of 15 mmHg, the estimated right ventricular systolic pressure is mildly elevated at 36.8 mmHg. The RV is moderately dilated with moderately reduced RV function. Right Atrium: Right atrial size was severely dilated. Right atrial pressure is estimated at 15 mmHg. Mitral Valve: The mitral valve is grossly normal. MV Area by PHT, 3.77 cm. MV PHT, 58.29 msec. Mild mitral valve regurgitation. Tricuspid Valve: The tricuspid valve is normal in structure. Tricuspid valve regurgitation is mild. Pulmonic Valve: The pulmonic valve was normal in structure. Pulmonic valve regurgitation is mild by color flow Doppler. Pulmonic regurgitation is mild by color flow Doppler. Aorta: The aortic root and ascending aorta are structurally normal, with no evidence of dilitation.  LEFT VENTRICLE          Normals PLAX 2D LVIDd:         3.69 cm  3.6 cm LVIDs:         2.81 cm  1.7 cm LV PW:         1.37 cm  1.4 cm LV IVS:        1.02 cm  1.3 cm LVOT diam:     1.90 cm  2.0 cm LV SV:         28 ml    79 ml LV SV Index:   11.96    45 ml/m2 LVOT Area:     2.84 cm 3.14 cm2  RIGHT VENTRICLE          IVC RV Basal diam:  3.55 cm  IVC diam: 2.57 cm RV Mid diam:    3.17 cm TAPSE (M-mode): 1.2 cm LEFT ATRIUM             Index       RIGHT ATRIUM           Index LA diam:         4.30 cm 1.98 cm/m  RA Area:     30.90 cm LA Vol (A2C):  67.1 ml 30.89 ml/m RA Volume:   111.00 ml 51.11 ml/m LA Vol (A4C):   68.8 ml 31.68 ml/m LA Biplane Vol: 68.0 ml 31.31 ml/m  AORTIC VALVE             Normals LVOT Vmax:   81.10 cm/s LVOT Vmean:  51.400 cm/s 75 cm/s LVOT VTI:    0.140 m     25.3 cm  AORTA                 Normals Ao Root diam: 2.90 cm 31 mm Ao Asc diam:  2.30 cm 31 mm MITRAL VALVE              Normals   TRICUSPID VALVE             Normals MV Area (PHT): 3.77 cm             TR Peak grad:   21.8 mmHg MV PHT:        58.29 msec 55 ms     TR Vmax:        261.00 cm/s 288 cm/s MV Decel Time: 201 msec   187 ms MV E velocity: 123.00 cm/s 103 cm/s SHUNTS                                     Systemic VTI:  0.14 m                                     Systemic Diam: 1.90 cm  Mertie Moores MD Electronically signed by Mertie Moores MD Signature Date/Time: 07/23/2019/5:33:58 PMThe mitral valve is grossly normal.    Final      Phillips Climes M.D on 07/26/2019 at 11:36 AM  Between 7am to 7pm - Pager - [phone]  After 7pm go to [email or web address] - password [facility]  [facility] -  Office  [phone]

## 2019-07-26 NOTE — Discharge Instructions (Addendum)
COVID-19 COVID-19 is a respiratory infection that is caused by a virus called severe acute respiratory syndrome coronavirus 2 (SARS-CoV-2). The disease is also known as coronavirus disease or novel coronavirus. In some people, the virus may not cause any symptoms. In others, it may cause a serious infection. The infection can get worse quickly and can lead to complications, such as:  Pneumonia, or infection of the lungs.  Acute respiratory distress syndrome or ARDS. This is a condition in which fluid build-up in the lungs prevents the lungs from filling with air and passing oxygen into the blood.  Acute respiratory failure. This is a condition in which there is not enough oxygen passing from the lungs to the body or when carbon dioxide is not passing from the lungs out of the body.  Sepsis or septic shock. This is a serious bodily reaction to an infection.  Blood clotting problems.  Secondary infections due to bacteria or fungus.  Organ failure. This is when your body's organs stop working. The virus that causes COVID-19 is contagious. This means that it can spread from person to person through droplets from coughs and sneezes (respiratory secretions). What are the causes? This illness is caused by a virus. You may catch the virus by:  Breathing in droplets from an infected person. Droplets can be spread by a person breathing, speaking, singing, coughing, or sneezing.  Touching something, like a table or a doorknob, that was exposed to the virus (contaminated) and then touching your mouth, nose, or eyes. What increases the risk? Risk for infection You are more likely to be infected with this virus if you:  Are within 6 feet (2 meters) of a person with COVID-19.  Provide care for or live with a person who is infected with COVID-19.  Spend time in crowded indoor spaces or live in shared housing. Risk for serious illness You are more likely to become seriously ill from the virus if  you:  Are 50 years of age or older. The higher your age, the more you are at risk for serious illness.  Live in a nursing home or long-term care facility.  Have cancer.  Have a long-term (chronic) disease such as: ? Chronic lung disease, including chronic obstructive pulmonary disease or asthma. ? A long-term disease that lowers your body's ability to fight infection (immunocompromised). ? Heart disease, including heart failure, a condition in which the arteries that lead to the heart become narrow or blocked (coronary artery disease), a disease which makes the heart muscle thick, weak, or stiff (cardiomyopathy). ? Diabetes. ? Chronic kidney disease. ? Sickle cell disease, a condition in which red blood cells have an abnormal "sickle" shape. ? Liver disease.  Are obese. What are the signs or symptoms? Symptoms of this condition can range from mild to severe. Symptoms may appear any time from 2 to 14 days after being exposed to the virus. They include:  A fever or chills.  A cough.  Difficulty breathing.  Headaches, body aches, or muscle aches.  Runny or stuffy (congested) nose.  A sore throat.  New loss of taste or smell. Some people may also have stomach problems, such as nausea, vomiting, or diarrhea. Other people may not have any symptoms of COVID-19. How is this diagnosed? This condition may be diagnosed based on:  Your signs and symptoms, especially if: ? You live in an area with a COVID-19 outbreak. ? You recently traveled to or from an area where the virus is common. ? You   provide care for or live with a person who was diagnosed with COVID-19. ? You were exposed to a person who was diagnosed with COVID-19.  A physical exam.  Lab tests, which may include: ? Taking a sample of fluid from the back of your nose and throat (nasopharyngeal fluid), your nose, or your throat using a swab. ? A sample of mucus from your lungs (sputum). ? Blood tests.  Imaging tests,  which may include, X-rays, CT scan, or ultrasound. How is this treated? At present, there is no medicine to treat COVID-19. Medicines that treat other diseases are being used on a trial basis to see if they are effective against COVID-19. Your health care provider will talk with you about ways to treat your symptoms. For most people, the infection is mild and can be managed at home with rest, fluids, and over-the-counter medicines. Treatment for a serious infection usually takes places in a hospital intensive care unit (ICU). It may include one or more of the following treatments. These treatments are given until your symptoms improve.  Receiving fluids and medicines through an IV.  Supplemental oxygen. Extra oxygen is given through a tube in the nose, a face mask, or a hood.  Positioning you to lie on your stomach (prone position). This makes it easier for oxygen to get into the lungs.  Continuous positive airway pressure (CPAP) or bi-level positive airway pressure (BPAP) machine. This treatment uses mild air pressure to keep the airways open. A tube that is connected to a motor delivers oxygen to the body.  Ventilator. This treatment moves air into and out of the lungs by using a tube that is placed in your windpipe.  Tracheostomy. This is a procedure to create a hole in the neck so that a breathing tube can be inserted.  Extracorporeal membrane oxygenation (ECMO). This procedure gives the lungs a chance to recover by taking over the functions of the heart and lungs. It supplies oxygen to the body and removes carbon dioxide. Follow these instructions at home: Lifestyle  If you are sick, stay home except to get medical care. Your health care provider will tell you how long to stay home. Call your health care provider before you go for medical care.  Rest at home as told by your health care provider.  Do not use any products that contain nicotine or tobacco, such as cigarettes,  e-cigarettes, and chewing tobacco. If you need help quitting, ask your health care provider.  Return to your normal activities as told by your health care provider. Ask your health care provider what activities are safe for you. General instructions  Take over-the-counter and prescription medicines only as told by your health care provider.  Drink enough fluid to keep your urine pale yellow.  Keep all follow-up visits as told by your health care provider. This is important. How is this prevented?  There is no vaccine to help prevent COVID-19 infection. However, there are steps you can take to protect yourself and others from this virus. To protect yourself:   Do not travel to areas where COVID-19 is a risk. The areas where COVID-19 is reported change often. To identify high-risk areas and travel restrictions, check the CDC travel website: wwwnc.cdc.gov/travel/notices  If you live in, or must travel to, an area where COVID-19 is a risk, take precautions to avoid infection. ? Stay away from people who are sick. ? Wash your hands often with soap and water for 20 seconds. If soap and water   are not available, use an alcohol-based hand sanitizer. ? Avoid touching your mouth, face, eyes, or nose. ? Avoid going out in public, follow guidance from your state and local health authorities. ? If you must go out in public, wear a cloth face covering or face mask. Make sure your mask covers your nose and mouth. ? Avoid crowded indoor spaces. Stay at least 6 feet (2 meters) away from others. ? Disinfect objects and surfaces that are frequently touched every day. This may include:  Counters and tables.  Doorknobs and light switches.  Sinks and faucets.  Electronics, such as phones, remote controls, keyboards, computers, and tablets. To protect others: If you have symptoms of COVID-19, take steps to prevent the virus from spreading to others.  If you think you have a COVID-19 infection, contact  your health care provider right away. Tell your health care team that you think you may have a COVID-19 infection.  Stay home. Leave your house only to seek medical care. Do not use public transport.  Do not travel while you are sick.  Wash your hands often with soap and water for 20 seconds. If soap and water are not available, use alcohol-based hand sanitizer.  Stay away from other members of your household. Let healthy household members care for children and pets, if possible. If you have to care for children or pets, wash your hands often and wear a mask. If possible, stay in your own room, separate from others. Use a different bathroom.  Make sure that all people in your household wash their hands well and often.  Cough or sneeze into a tissue or your sleeve or elbow. Do not cough or sneeze into your hand or into the air.  Wear a cloth face covering or face mask. Make sure your mask covers your nose and mouth. Where to find more information  Centers for Disease Control and Prevention: www.cdc.gov/coronavirus/2019-ncov/index.html  World Health Organization: www.who.int/health-topics/coronavirus Contact a health care provider if:  You live in or have traveled to an area where COVID-19 is a risk and you have symptoms of the infection.  You have had contact with someone who has COVID-19 and you have symptoms of the infection. Get help right away if:  You have trouble breathing.  You have pain or pressure in your chest.  You have confusion.  You have bluish lips and fingernails.  You have difficulty waking from sleep.  You have symptoms that get worse. These symptoms may represent a serious problem that is an emergency. Do not wait to see if the symptoms will go away. Get medical help right away. Call your local emergency services (911 in the [place] Do not drive yourself to the hospital. Let the emergency medical personnel know if you think you have  COVID-19. Summary  COVID-19 is a respiratory infection that is caused by a virus. It is also known as coronavirus disease or novel coronavirus. It can cause serious infections, such as pneumonia, acute respiratory distress syndrome, acute respiratory failure, or sepsis.  The virus that causes COVID-19 is contagious. This means that it can spread from person to person through droplets from breathing, speaking, singing, coughing, or sneezing.  You are more likely to develop a serious illness if you are 50 years of age or older, have a weak immune system, live in a nursing home, or have chronic disease.  There is no medicine to treat COVID-19. Your health care provider will talk with you about ways to treat your symptoms.    Take steps to protect yourself and others from infection. Wash your hands often and disinfect objects and surfaces that are frequently touched every day. Stay away from people who are sick and wear a mask if you are sick. This information is not intended to replace advice given to you by your health care provider. Make sure you discuss any questions you have with your health care provider. Document Revised: 04/09/2019 Document Reviewed: 07/16/2018 Elsevier Patient Education  [place] on my medicine - ELIQUIS (apixaban)  Why was Eliquis prescribed for you? Eliquis was prescribed to treat blood clots that may have been found in the veins of your legs (deep vein thrombosis) or in your lungs (pulmonary embolism) and to reduce the risk of them occurring again.  What do You need to know about Eliquis ? The starting dose is 10 mg (two 5 mg tablets) taken TWICE daily for the FIRST SEVEN (7) DAYS, then  the dose is reduced to ONE 5 mg tablet taken TWICE daily.  Eliquis may be taken with or without food.   Try to take the dose about the same time in the morning and in the evening. If you have difficulty swallowing the tablet whole please discuss with your  pharmacist how to take the medication safely.  Take Eliquis exactly as prescribed and DO NOT stop taking Eliquis without talking to the doctor who prescribed the medication.  Stopping may increase your risk of developing a new blood clot.  Refill your prescription before you run out.  After discharge, you should have regular check-up appointments with your healthcare provider that is prescribing your Eliquis.    What do you do if you miss a dose? If a dose of ELIQUIS is not taken at the scheduled time, take it as soon as possible on the same day and twice-daily administration should be resumed. The dose should not be doubled to make up for a missed dose.  Important Safety Information A possible side effect of Eliquis is bleeding. You should call your healthcare provider right away if you experience any of the following: ? Bleeding from an injury or your nose that does not stop. ? Unusual colored urine (red or dark brown) or unusual colored stools (red or black). ? Unusual bruising for unknown reasons. ? A serious fall or if you hit your head (even if there is no bleeding).  Some medicines may interact with Eliquis and might increase your risk of bleeding or clotting while on Eliquis. To help avoid this, consult your healthcare provider or pharmacist prior to using any new prescription or non-prescription medications, including herbals, vitamins, non-steroidal anti-inflammatory drugs (NSAIDs) and supplements.  This website has more information on Eliquis (apixaban): http://www.eliquis.com/eliquis/home  ------------------------------------------------------------------------------------------------------- Pulmonary Embolism    A pulmonary embolism (PE) is a sudden blockage or decrease of blood flow in one or both lungs. Most blockages come from a blood clot that forms in the vein of a lower leg, thigh, or arm (deep vein thrombosis, DVT) and travels to the lungs. A clot is blood that has  thickened into a gel or solid. PE is a dangerous and life-threatening condition that needs to be treated right away.  What are the causes? This condition is usually caused by a blood clot that forms in a vein and moves to the lungs. In rare cases, it may be caused by air, fat, part of a tumor, or other tissue that moves through the veins and into the lungs.  What increases the  risk? The following factors may make you more likely to develop this condition: 1. Experiencing a traumatic injury, such as breaking a hip or leg. 2. Having: ? A spinal cord injury. ? Orthopedic surgery, especially hip or knee replacement. ? Any major surgery. ? A stroke. ? DVT. ? Blood clots or blood clotting disease. ? Long-term (chronic) lung or heart disease. ? Cancer treated with chemotherapy. ? A central venous catheter. 3. Taking medicines that contain estrogen. These include birth control pills and hormone replacement therapy. 4. Being: ? Pregnant. ? In the period of time after your baby is delivered (postpartum). ? Older than age 48. ? Overweight. ? A smoker, especially if you have other risks.  What are the signs or symptoms? Symptoms of this condition usually start suddenly and include:  Shortness of breath during activity or at rest.  Coughing, coughing up blood, or coughing up blood-tinged mucus.  Chest pain that is often worse with deep breaths.  Rapid or irregular heartbeat.  Feeling light-headed or dizzy.  Fainting.  Feeling anxious.  Fever.  Sweating.  Pain and swelling in a leg. This is a symptom of DVT, which can lead to PE. How is this diagnosed? This condition may be diagnosed based on:  Your medical history.  A physical exam.  Blood tests.  CT pulmonary angiogram. This test checks blood flow in and around your lungs.  Ventilation-perfusion scan, also called a lung VQ scan. This test measures air flow and blood flow to the lungs.  An ultrasound of the  legs.  How is this treated? Treatment for this condition depends on many factors, such as the cause of your PE, your risk for bleeding or developing more clots, and other medical conditions you have. Treatment aims to remove, dissolve, or stop blood clots from forming or growing larger. Treatment may include: 1. Medicines, such as: ? Blood thinning medicines (anticoagulants) to stop clots from forming. ? Medicines that dissolve clots (thrombolytics). 2. Procedures, such as: ? Using a flexible tube to remove a blood clot (embolectomy) or to deliver medicine to destroy it (catheter-directed thrombolysis). ? Inserting a filter into a large vein that carries blood to the heart (inferior vena cava). This filter (vena cava filter) catches blood clots before they reach the lungs. ? Surgery to remove the clot (surgical embolectomy). This is rare. You may need a combination of immediate, long-term (up to 3 months after diagnosis), and extended (more than 3 months after diagnosis) treatments. Your treatment may continue for several months (maintenance therapy). You and your health care provider will work together to choose the treatment program that is best for you.  Follow these instructions at home: Medicines 1. Take over-the-counter and prescription medicines only as told by your health care provider. 2. If you are taking an anticoagulant medicine: ? Take the medicine every day at the same time each day. ? Understand what foods and drugs interact with your medicine. ? Understand the side effects of this medicine, including excessive bruising or bleeding. Ask your health care provider or pharmacist about other side effects.  General instructions  Wear a medical alert bracelet or carry a medical alert card that says you have had a PE and lists what medicines you take.  Ask your health care provider when you may return to your normal activities. Avoid sitting or lying for a long time without  moving.  Maintain a healthy weight. Ask your health care provider what weight is healthy for you.  Do not use  any products that contain nicotine or tobacco, such as cigarettes, e-cigarettes, and chewing tobacco. If you need help quitting, ask your health care provider.  Talk with your health care provider about any travel plans. It is important to make sure that you are still able to take your medicine while on trips.  Keep all follow-up visits as told by your health care provider. This is important.  Contact a health care provider if:  You missed a dose of your blood thinner medicine.  Get help right away if: 1. You have: ? New or increased pain, swelling, warmth, or redness in an arm or leg. ? Numbness or tingling in an arm or leg. ? Shortness of breath during activity or at rest. ? A fever. ? Chest pain. ? A rapid or irregular heartbeat. ? A severe headache. ? Vision changes. ? A serious fall or accident, or you hit your head. ? Stomach (abdominal) pain. ? Blood in your vomit, stool, or urine. ? A cut that will not stop bleeding. 2. You cough up blood. 3. You feel light-headed or dizzy. 4. You cannot move your arms or legs. 5. You are confused or have memory loss.  These symptoms may represent a serious problem that is an emergency. Do not wait to see if the symptoms will go away. Get medical help right away. Call your local emergency services (911 in the [place] Do not drive yourself to the hospital. Summary  A pulmonary embolism (PE) is a sudden blockage or decrease of blood flow in one or both lungs. PE is a dangerous and life-threatening condition that needs to be treated right away.  Treatments for this condition usually include medicines to thin your blood (anticoagulants) or medicines to break apart blood clots (thrombolytics).  If you are given blood thinners, it is important to take the medicine every day at the same time each day.  Understand what foods and drugs  interact with any medicines that you are taking.  If you have signs of PE or DVT, call your local emergency services (911 in the [place] This information is not intended to replace advice given to you by your health care provider. Make sure you discuss any questions you have with your health care provider. Document Revised: 03/18/2018 Document Reviewed: 03/18/2018 Elsevier Patient Education  2020 [employer].

## 2019-07-27 ENCOUNTER — Encounter (HOSPITAL_COMMUNITY): Payer: Self-pay | Admitting: Family Medicine

## 2019-07-27 LAB — COMPREHENSIVE METABOLIC PANEL
ALT: 18 U/L (ref 0–44)
AST: 15 U/L (ref 15–41)
Albumin: 2.7 g/dL — ABNORMAL LOW (ref 3.5–5.0)
Alkaline Phosphatase: 62 U/L (ref 38–126)
Anion gap: 8 (ref 5–15)
BUN: 32 mg/dL — ABNORMAL HIGH (ref 8–23)
CO2: 27 mmol/L (ref 22–32)
Calcium: 8.9 mg/dL (ref 8.9–10.3)
Chloride: 101 mmol/L (ref 98–111)
Creatinine, Ser: 0.85 mg/dL (ref 0.44–1.00)
GFR calc Af Amer: >60 mL/min (ref >60)
GFR calc non Af Amer: >60 mL/min (ref >60)
Glucose, Bld: 171 mg/dL — ABNORMAL HIGH (ref 70–99)
Potassium: 4.3 mmol/L (ref 3.5–5.1)
Sodium: 136 mmol/L (ref 135–145)
Total Bilirubin: 0.6 mg/dL (ref 0.3–1.2)
Total Protein: 6.5 g/dL (ref 6.5–8.1)

## 2019-07-27 LAB — C-REACTIVE PROTEIN: CRP: 8.2 mg/dL — ABNORMAL HIGH (ref <1.0)

## 2019-07-27 LAB — PROCALCITONIN: Procalcitonin: <0.1 ng/mL

## 2019-07-27 LAB — MAGNESIUM: Magnesium: 1.9 mg/dL (ref 1.7–2.4)

## 2019-07-27 LAB — D-DIMER, QUANTITATIVE: D-Dimer, Quant: 3.13 ug/mL-FEU — ABNORMAL HIGH (ref 0.00–0.50)

## 2019-07-27 MED ORDER — FUROSEMIDE 10 MG/ML IJ SOLN
20.0000 mg | Freq: Two times a day (BID) | INTRAMUSCULAR | Status: DC
  Administered 2019-07-27 – 2019-07-29 (×4): 20 mg via INTRAVENOUS
  Filled 2019-07-27 (×4): qty 2

## 2019-07-27 MED ORDER — DIGOXIN 0.25 MG/ML IJ SOLN
0.2500 mg | Freq: Once | INTRAMUSCULAR | Status: AC
  Administered 2019-07-27: 16:00:00 0.25 mg via INTRAVENOUS
  Filled 2019-07-27: qty 1

## 2019-07-27 MED ORDER — DIGOXIN 125 MCG PO TABS
0.1250 mg | ORAL_TABLET | Freq: Every day | ORAL | Status: DC
  Administered 2019-07-28 – 2019-07-30 (×3): 0.125 mg via ORAL
  Filled 2019-07-27 (×3): qty 1

## 2019-07-27 MED ORDER — FUROSEMIDE 10 MG/ML IJ SOLN
20.0000 mg | Freq: Once | INTRAMUSCULAR | Status: AC
  Administered 2019-07-27: 12:00:00 20 mg via INTRAVENOUS
  Filled 2019-07-27: qty 2

## 2019-07-27 NOTE — Progress Notes (Signed)
Assisted tele visit to patient with Cardiologist Dr Luvenia Starch.  Maryelizabeth Rowan, RN

## 2019-07-27 NOTE — Consult Note (Addendum)
Cardiology Consultation:   Due to the COVID-19 pandemic, this visit was completed with telemedicine (audio/video) technology to reduce patient and provider exposure as well as to preserve personal protective equipment.   Patient ID: Miranda Moses MRN: [id number]; DOB: Mar 23, 1941  Admit date: 07/23/2019 Date of Consult: 07/27/2019  Primary Care Provider: [name], PA-C Primary Cardiologist: Dr. [name]  Patient Profile:   Miranda Moses is a 79 y.o. female with a hx of CAD, HTN, HLD and chronic diastolic dysfunction who is being seen today for the evaluation of afib & CHF at the request of Dr. Landis Gandy.  Coronary CT 10/2018 showed calcium score of 1245. CT FFR analysis showed significant stenosis in the distal portion of a small non-dominant LCX artery. An aggressive medical management is recommended.  History of Present Illness:   Miranda Moses dx with COVID-19 on 07/09/19. Managed outpatient however represented 07/23/19 with fatigue and lightheadedness. He have noted hypoxic. D-dimer elevated. Admitted to [facility] with acute bilateral extensive pulmonary embolism in setting of COVID-19 pneumonia and afib RVR. Started on IV heparin and now transitioned to Eliquis. Placed on IV cardizem for rate control, discontinued 1/31. Treated with Remdesivir. Echo 07/23/19 showed LVEF of 55-60%, RV moderately dilated with moderately reduced LV function. Mild MR. Mild elevated pulmonary hypertension.   Scr and electrolytes normal. BNP 633. CRP peaked at 14.4>>8.2 today. D-dimer peaked at 17.1 >>3.13 today. TSH normal. Net I & O -1.1L.    Cardiology is asked for afib and CHF management. BP soft low on metoprolol 50mg  BID. Got IV lasix 20mg  x 1 today and IV lasix 20mg  x 2 yesterday. Also got 2 dose of IV digoxin on 1/31.  Echo 07/23/2019 1. Left ventricular ejection fraction, by visual estimation, is 55 to 60%.  2. Global right ventricle has moderately reduced systolic function.The  right ventricular size is  moderately enlarged. no increase in right ventricular wall thickness.  3. The RV is moderately dilated with moderately reduced RV function.  4. Right atrial size was severely dilated.  5. The mitral valve is grossly normal. Mild mitral valve regurgitation.  6. The tricuspid valve was normal in structure. Tricuspid valve regurgitation is mild.  7. Tricuspid valve regurgitation is mild.  8. The pulmonic valve was normal in structure. Pulmonic valve  regurgitation is mild by color flow Doppler.  9. Mildly elevated pulmonary artery systolic pressure.   Past Medical History:  Diagnosis Date  . CAD in native artery    by CT coronary 10/2018 >> medical therapy   . Chronic diastolic (congestive) heart failure ([place])   . GERD (gastroesophageal reflux disease)   . Hyperlipidemia   . Hypertension   . Pre-diabetes   . Pulmonary hypertension ([place])     Past Surgical History:  Procedure Laterality Date  . ABDOMINAL HYSTERECTOMY    . CATARACT EXTRACTION Left 03/2014  . CATARACT EXTRACTION Right 05/2014  . COLONOSCOPY  11/05/2013    Inpatient Medications: Scheduled Meds: . apixaban  10 mg Oral BID  . aspirin EC  81 mg Oral Daily  . atorvastatin  20 mg Oral Daily  . dexamethasone  6 mg Oral Daily  . metoprolol tartrate  50 mg Oral BID  . pantoprazole  40 mg Oral Daily  . sodium chloride flush  3 mL Intravenous Q12H  . sodium chloride flush  3 mL Intravenous Q12H   Continuous Infusions: . sodium chloride     PRN Meds: sodium chloride, acetaminophen, HYDROcodone-acetaminophen, ondansetron **OR** ondansetron (ZOFRAN) IV, senna-docusate, sodium chloride, sodium  chloride flush  Allergies:    Allergies  Allergen Reactions  . Clarithromycin     Numbness of extremities  . Codeine Other (See Comments)    Caused pain  . Penicillins Rash    Did it involve swelling of the face/tongue/throat, SOB, or low BP? No Did it involve sudden or severe rash/hives, skin peeling, or any reaction on  the inside of your mouth or nose? Yes Did you need to seek medical attention at a hospital or doctor's office? Yes When did it last happen?20 years If all above answers are "NO", may proceed with cephalosporin use.     Social History:   Social History   Socioeconomic History  . Marital status: Married    Spouse name: Not on file  . Number of children: 1  . Years of education: Not on file  . Highest education level: Not on file  Occupational History  . Not on file  Tobacco Use  . Smoking status: Never Smoker  . Smokeless tobacco: Never Used  Substance and Sexual Activity  . Alcohol use: Never  . Drug use: Never  . Sexual activity: Not on file  Other Topics Concern  . Not on file  Social History Narrative  . Not on file   Social Determinants of Health   Financial Resource Strain:   . Difficulty of Paying Living Expenses: Not on file  Food Insecurity:   . Worried About Charity fundraiser in the Last Year: Not on file  . Ran Out of Food in the Last Year: Not on file  Transportation Needs:   . Lack of Transportation (Medical): Not on file  . Lack of Transportation (Non-Medical): Not on file  Physical Activity:   . Days of Exercise per Week: Not on file  . Minutes of Exercise per Session: Not on file  Stress:   . Feeling of Stress : Not on file  Social Connections:   . Frequency of Communication with Friends and Family: Not on file  . Frequency of Social Gatherings with Friends and Family: Not on file  . Attends Religious Services: Not on file  . Active Member of Clubs or Organizations: Not on file  . Attends Archivist Meetings: Not on file  . Marital Status: Not on file  Intimate Partner Violence:   . Fear of Current or Ex-Partner: Not on file  . Emotionally Abused: Not on file  . Physically Abused: Not on file  . Sexually Abused: Not on file    Family History:   Family History  Problem Relation Age of Onset  . Heart disease Father     ROS:    Please see the history of present illness.  All other ROS reviewed and negative.     Physical Exam/Data:   Vitals:   07/27/19 0324 07/27/19 0736 07/27/19 0830 07/27/19 1135  BP: 91/60 (!) 104/51 (!) 98/52 108/70  Pulse: 71 96    Resp: 20 16 16 19   Temp: 97.7 F (36.5 C) (!) 97.2 F (36.2 C)  97.7 F (36.5 C)  TempSrc: Oral Oral  Oral  SpO2: 92% 94%    Weight:      Height:        Intake/Output Summary (Last 24 hours) at 07/27/2019 1247 Last data filed at 07/27/2019 0300 Gross per 24 hour  Intake 610 ml  Output 1350 ml  Net -740 ml   Last 3 Weights 07/23/2019 04/22/2019 01/01/2019  Weight (lbs) 249 lb 3.2 oz 237 lb  235 lb 3.2 oz  Weight (kg) 113.036 kg 107.502 kg 106.686 kg     Body mass index is 41.47 kg/m.   VITAL SIGNS:  reviewed GEN:  no acute distress PSYCH:  normal affect  EKG:  The EKG 08/28/18 was personally reviewed and demonstrates:  Sinus bradycardia at 56 bpm No EKG this admission in Epic Telemetry:  Telemetry was personally reviewed and demonstrates:  Atrial fibrillation at rate of 90-100s  Relevant CV Studies:  CT coronary 10/2018  IMPRESSION: 1. Coronary calcium score of 1245. This was 95 percentile for age and sex matched control.  2. Normal coronary origin with right dominance.  3. Diffuse plaque with > 70% stenosis in the ostial/proximal RCA and moderate stenosis in the proximal LAD and LCX arteries. Additional analysis with CT FFR will be submitted CLINICAL DATA:  79 year old female with abnormal coronary CTA.  FINDINGS: FFRct analysis was performed on the original cardiac CT angiogram dataset. Diagrammatic representation of the FFRct analysis is provided in a separate PDF document in PACS. This dictation was created using the PDF document and an interactive 3D model of the results. 3D model is not available in the EMR/PACS. Normal FFR range is >0.80.  1. Left Main:  No significant stenosis.  2. LAD: No significant stenosis. 3. LCX:  Proximal: 0.91, distal: 0.74. 4. RCA: No significant stenosis.  IMPRESSION: 1. CT FFR analysis showed significant stenosis in the distal portion of a small non-dominant LCX artery. An aggressive medical management is recommended.   Laboratory Data:  Chemistry Recent Labs  Lab 07/25/19 0221 07/26/19 0353 07/27/19 0345  NA 135 136 136  K 4.3 4.9 4.3  CL 105 105 101  CO2 22 23 27   GLUCOSE 100* 160* 171*  BUN 24* 25* 32*  CREATININE 0.85 0.79 0.85  CALCIUM 8.3* 9.1 8.9  GFRNONAA >60 >60 >60  GFRAA >60 >60 >60  ANIONGAP 8 8 8     Recent Labs  Lab 07/25/19 0221 07/26/19 0353 07/27/19 0345  PROT 6.0* 6.5 6.5  ALBUMIN 2.7* 2.9* 2.7*  AST 20 18 15   ALT 19 17 18   ALKPHOS 57 64 62  BILITOT 0.8 0.8 0.6   Hematology Recent Labs  Lab 07/24/19 0302 07/25/19 0221 07/26/19 0353  WBC 6.0 7.2 8.2  RBC 4.12 4.15 4.67  HGB 11.4* 11.4* 12.9  HCT 35.7* 36.0 40.4  MCV 86.7 86.7 86.5  MCH 27.7 27.5 27.6  MCHC 31.9 31.7 31.9  RDW 14.5 14.6 14.3  PLT 167 196 247    BNP Recent Labs  Lab 07/26/19 0430  BNP 633.2*    DDimer  Recent Labs  Lab 07/25/19 0221 07/26/19 0353 07/27/19 0345  DDIMER 3.65* 3.67* 3.13*    Radiology/Studies:  DG Chest Port 1 View  Result Date: 07/24/2019 CLINICAL DATA:  Dyspnea.  COVID-19. EXAM: PORTABLE CHEST 1 VIEW COMPARISON:  07/22/2019 from [facility]. FINDINGS: Patient rotated minimally right. Mild cardiomegaly. Atherosclerosis in the transverse aorta. No pleural effusion or pneumothorax. No lobar consolidation. Pulmonary interstitial thickening, felt to be accentuated by AP portable technique and diminished lung volumes. Areas of mild left infrahilar scarring are felt to be similar. IMPRESSION: Cardiomegaly with diminished lung volumes, chronic interstitial thickening. No convincing evidence of superimposed pneumonia. Aortic Atherosclerosis (ICD10-I70.0). Electronically Signed   By: Abigail Miyamoto M.D.   On: 07/24/2019 16:03    ECHOCARDIOGRAM LIMITED  Result Date: 07/23/2019   ECHOCARDIOGRAM LIMITED REPORT   Patient Name:   Miranda Moses Date of Exam: 07/23/2019 Medical Rec #:  [id number]  Height:       65.0 in Accession #:    [id number] Weight:       249.2 lb Date of Birth:  05/06/1941  BSA:          2.17 m Patient Age:    37 years   BP:           115/91 mmHg Patient Gender: F          HR:           103 bpm. Exam Location:  Inpatient  Procedure: Limited Echo, Cardiac Doppler and Limited Color Doppler Indications:    I26.02 Pulmonary embolus  History:        Patient has prior history of Echocardiogram examinations, most                 recent 10/14/2018.  Sonographer:    Jonelle Sidle Dance Referring Phys: [id number] [place]  1. Left ventricular ejection fraction, by visual estimation, is 55 to 60%.  2. Global right ventricle has moderately reduced systolic function.The right ventricular size is moderately enlarged. no increase in right ventricular wall thickness.  3. The RV is moderately dilated with moderately reduced RV function.  4. Right atrial size was severely dilated.  5. The mitral valve is grossly normal. Mild mitral valve regurgitation.  6. The tricuspid valve was normal in structure. Tricuspid valve regurgitation is mild.  7. Tricuspid valve regurgitation is mild.  8. The pulmonic valve was normal in structure. Pulmonic valve regurgitation is mild by color flow Doppler.  9. Mildly elevated pulmonary artery systolic pressure. FINDINGS  Left Ventricle: Left ventricular ejection fraction, by visual estimation, is 55 to 60%. Right Ventricle: The right ventricular size is moderately enlarged. No increase in right ventricular wall thickness. Global RV systolic function is has moderately reduced systolic function. The tricuspid regurgitant velocity is 2.33 m/s, and with an assumed right atrial pressure of 15 mmHg, the estimated right ventricular systolic pressure is mildly elevated at 36.8 mmHg. The RV is moderately dilated  with moderately reduced RV function. Right Atrium: Right atrial size was severely dilated. Right atrial pressure is estimated at 15 mmHg. Mitral Valve: The mitral valve is grossly normal. MV Area by PHT, 3.77 cm. MV PHT, 58.29 msec. Mild mitral valve regurgitation. Tricuspid Valve: The tricuspid valve is normal in structure. Tricuspid valve regurgitation is mild. Pulmonic Valve: The pulmonic valve was normal in structure. Pulmonic valve regurgitation is mild by color flow Doppler. Pulmonic regurgitation is mild by color flow Doppler. Aorta: The aortic root and ascending aorta are structurally normal, with no evidence of dilitation.  LEFT VENTRICLE          Normals PLAX 2D LVIDd:         3.69 cm  3.6 cm LVIDs:         2.81 cm  1.7 cm LV PW:         1.37 cm  1.4 cm LV IVS:        1.02 cm  1.3 cm LVOT diam:     1.90 cm  2.0 cm LV SV:         28 ml    79 ml LV SV Index:   11.96    45 ml/m2 LVOT Area:     2.84 cm 3.14 cm2  RIGHT VENTRICLE          IVC RV Basal diam:  3.55 cm  IVC diam: 2.57 cm RV Mid diam:  3.17 cm TAPSE (M-mode): 1.2 cm LEFT ATRIUM             Index       RIGHT ATRIUM           Index LA diam:        4.30 cm 1.98 cm/m  RA Area:     30.90 cm LA Vol (A2C):   67.1 ml 30.89 ml/m RA Volume:   111.00 ml 51.11 ml/m LA Vol (A4C):   68.8 ml 31.68 ml/m LA Biplane Vol: 68.0 ml 31.31 ml/m  AORTIC VALVE             Normals LVOT Vmax:   81.10 cm/s LVOT Vmean:  51.400 cm/s 75 cm/s LVOT VTI:    0.140 m     25.3 cm  AORTA                 Normals Ao Root diam: 2.90 cm 31 mm Ao Asc diam:  2.30 cm 31 mm MITRAL VALVE              Normals   TRICUSPID VALVE             Normals MV Area (PHT): 3.77 cm             TR Peak grad:   21.8 mmHg MV PHT:        58.29 msec 55 ms     TR Vmax:        261.00 cm/s 288 cm/s MV Decel Time: 201 msec   187 ms MV E velocity: 123.00 cm/s 103 cm/s SHUNTS                                     Systemic VTI:  0.14 m                                     Systemic Diam: 1.90 cm  Mertie Moores MD  Electronically signed by Mertie Moores MD Signature Date/Time: 07/23/2019/5:33:58 PMThe mitral valve is grossly normal.    Final     Assessment and Plan:   1. Atrial fibrillation with RVR - New onset. In setting of COVID pneumonia and bilateral PE. TSH normal. Echo showed preserved LVEF. CHADSVASC score of 4 for sex, age and HTN. Anticoagulated with heparin>> now on Eliquis. IV cardizem stopped. Given 2 dose of IV digoxin. Currently on metoprolol 50mg  BID. HR in 90-100s.  - Will give one dose of IV digoxin 0.25mg  today and then 0.125mg  qd starting tomorrow. Continue current dose of BB.   2. Bilateral PE - R sided heart strain by echo. Treated with heparin, now on Eliquis.   3. Chronic diastolic CHF - BNP 99991111. Give IV lasix 20mg  x 2 yesterday and IV lasix 20mg  x 1 today.  - BP low in 100s. - Continue IV lasix 20mg  BID - Strict I & O and close monitoring of BP - Renal function normal  4. COVID pneumonia - treated with remdesivir  For questions or updates, please contact [facility] Please consult [email or web address] for contact info under     Jarrett Soho, PA  07/27/2019 12:47 PM   I have seen and examined the patient along with Leanor Kail, [place] .  I have reviewed the chart, notes and new data.  I agree with PA/NP's note.  Key new complaints: breathing is improving, still edematous. She is completely unaware of the arrhythmia. Key examination changes: morbidly obese, limited exam by video She appears very comfortable sitting up in chair, speaking in uninterrupted sentences (off O2), breathing not labored. Trace to 1+ ankle edema. Cannot see JVD. Not pale. No gross focal neuro abnormalities. Alert and oriented. Normal mood and affect. Key new findings / data: reviewed telemetry - AFib. When relaxed at rest , rates in 80-100 range, 110-120 when stimulated, sitting up to eat. Inflammatory markers are improving. Echo shows RV dilation and reduced function, normal LV.  The RA is also severely dilated (suggesting a possible preexisting cause of cor pulmonale, such as OSA or obesity-hypoventilation)  PLAN: BP limits use of conventional AV node blockers in higher doses. She received dig 0.5 mg total in last 2 days, not yet fully "loaded". Gave another 0.25 mg IV today and plan daily oral digoxin. If rate is still fast in 48 hours, only other option is digoxin. Would be satisfied with a ventricular rate <100-110, considering she is still recovering from a large pulmonary embolism. Continue slow diuresis. If she remains in persistent atrial fibrillation after 3 weeks of anticoagulation, can schedule for elective cardioversion.  Sanda Klein, MD, [place] [phone] 07/27/2019, 2:40 PM

## 2019-07-27 NOTE — Progress Notes (Signed)
PROGRESS NOTE                                                                                                                                                                                                             Patient Demographics:    Miranda Moses, is a 79 y.o. female, DOB - [birth year], [id number]  Admit date - 07/23/2019   Admitting Physician [name], MD  Outpatient Primary MD for the patient is [name], PA-C  LOS - 4   No chief complaint on file.      Brief Narrative     79 y.o. female with medical history significant for hypertension, prediabetes, pulmonary arterial hypertension, chronic diastolic CHF, coronary artery disease, and COVID-19 diagnosed on 07/09/2019, now presenting to the emergency department for evaluation of fatigue, lightheadedness, and malaise.  tested positive for COVID-19 (07/09/2019), reports chronic lower extremity edema. Upon arrival to the ED, patient is found to be afebrile, saturating mid-90's on room air, RR 22, BP 97/57, and heart rate of 140.  EKG with atrial fibrillation, rate 138.  CBC and chem panel unremarkable.  Troponin 0.86 initially, decreased when repeated 4 hours later.  proBNP 4440.  D-dimer elevated and CTA chest concerning for extensive bilateral pulmonary embolism with evidence for right heart strain.  Patient was given a fluid bolus, IV diltiazem load and infusion, and started on IV heparin.  Was transferred to [facility] for further management.   Subjective:    Miranda Moses today denies any chest pain, reports dyspnea is improving. Ports some cough   Assessment  & Plan :    Principal Problem:   Acute pulmonary embolism (HCC) Active Problems:   Hypertension   New onset atrial fibrillation (HCC)   Atrial fibrillation with RVR ([place])   COVID-19 virus infection    Acute pulmonary embolism  - Presents with d-dimer 7925, and CTA chest with extensive bilateral PE, RV/LV 1.4  - D echo  significant for right heart strain with right ventricle moderately dilated and with reduced function. - No chest pain or respiratory distress , but she reports dyspnea. -Troponins elevated on admission, but trending down. -Given significant clot burden, was treated with heparin GTT for 72 hours, currently transitioned to Eliquis .  New-onset atrial fibrillation with RVR  - Presents in atrial fibrillation with RVR, appears to be new-onset  -  Required Cardizem drip initially, currently transitioned to oral metoprolol, she did require some as needed reduction as well , overall heart rate controlled labile, with some frequent RVR . Blood Pressure is soft, no role to increase metoprolol. - Likely precipitated by the pulmonary disease  - CHADS-VASc 5 (age x2, CAD, HTN, gender), patient on heparin, currently on Eliquis. -We will need to follow with her primary cardiologist Dr. [name] as an outpatient -Cardiology consulted requested to assist with heart rate control, given soft blood pressure and need for further diuresis as well.  Chronic diastolic CHF  - Appears hypervolemic  , she is with elevated BNP - EF 60-65% in April 2020  - Difficult diuresis given soft blood pressure, will give 20 mg of IV Lasix today.  COVID-19 pneumonia - Initially diagnosed on 07/09/19, confirmed in ED  - CTA chest hazy ground glass opacities   - RR 20, saturation 94% on ra at time of admission  - Continue with IV remdesivir day 5/5 -Continue with IV steroids. -Continue to monitor inflammatory markers closely, as they were trending up until yesterday.   COVID-19 Labs  Recent Labs    07/25/19 0221 07/26/19 0353 07/27/19 0345  DDIMER 3.65* 3.67* 3.13*  FERRITIN 91 97  --   CRP 10.4* 14.4* 8.2*    No results found for: SARSCOV2NAA CAD - No angina  - She had been intolerant of statins, continue ASA  and beta-blockers  Hypertension  - Pressure remains soft, continue to hold home medications    Code  Status : Full  Family communication: D/W daughter via phone 2/2  Disposition Plan  : Home with home health in 1 to 2 days  Barriers For Discharge : Heart rate remains uncontrolled, awaiting input from cardiology, still with some volume overload in need of IV diuresis.  Consults  :  cardiology  Procedures  : None  DVT Prophylaxis  :  Heparin GTT  Lab Results  Component Value Date   PLT 247 07/26/2019    Antibiotics  :    Anti-infectives (From admission, onward)   Start     Dose/Rate Route Frequency Ordered Stop   07/24/19 1000  remdesivir 100 mg in sodium chloride 0.9 % 100 mL IVPB     100 mg 200 mL/hr over 30 Minutes Intravenous Daily 07/23/19 0555 07/27/19 0951   07/23/19 0700  remdesivir 200 mg in sodium chloride 0.9% 250 mL IVPB     200 mg 580 mL/hr over 30 Minutes Intravenous Once 07/23/19 0555 07/23/19 0902        Objective:   Vitals:   07/26/19 2011 07/27/19 0324 07/27/19 0736 07/27/19 0830  BP: 124/85 91/60 (!) 104/51 (!) 98/52  Pulse: 80 71 96   Resp: 18 20 16 16   Temp: 98 F (36.7 C) 97.7 F (36.5 C) (!) 97.2 F (36.2 C)   TempSrc: Oral Oral Oral   SpO2: 92% 92% 94%   Weight:      Height:        Wt Readings from Last 3 Encounters:  07/23/19 113 kg  04/22/19 107.5 kg  01/01/19 106.7 kg     Intake/Output Summary (Last 24 hours) at 07/27/2019 1050 Last data filed at 07/27/2019 0300 Gross per 24 hour  Intake 610 ml  Output 1350 ml  Net -740 ml     Physical Exam  Awake Alert, Oriented X 3, No new F.N deficits, Normal affect Symmetrical Chest wall movement, Good air movement bilaterally, CTAB Irregular irregular,No Gallops,Rubs or new  Murmurs, No Parasternal Heave +ve B.Sounds, Abd Soft, No tenderness, No rebound - guarding or rigidity. No Cyanosis, Clubbing, trace edema, No new Rash or bruise         Data Review:    CBC Recent Labs  Lab 07/23/19 0651 07/24/19 0302 07/25/19 0221 07/26/19 0353  WBC 5.3 6.0 7.2 8.2  HGB 11.6* 11.4*  11.4* 12.9  HCT 36.3 35.7* 36.0 40.4  PLT 136* 167 196 247  MCV 86.2 86.7 86.7 86.5  MCH 27.6 27.7 27.5 27.6  MCHC 32.0 31.9 31.7 31.9  RDW 14.5 14.5 14.6 14.3  LYMPHSABS 0.8 0.7 0.9 0.6*  MONOABS 0.5 0.6 0.8 0.3  EOSABS 0.0 0.0 0.1 0.0  BASOSABS 0.0 0.0 0.0 0.0    Chemistries  Recent Labs  Lab 07/23/19 0651 07/24/19 0302 07/25/19 0221 07/26/19 0353 07/27/19 0345  NA 135 138 135 136 136  K 3.7 3.8 4.3 4.9 4.3  CL 104 105 105 105 101  CO2 18* 23 22 23 27   GLUCOSE 107* 99 100* 160* 171*  BUN 24* 26* 24* 25* 32*  CREATININE 0.84 0.86 0.85 0.79 0.85  CALCIUM 8.0* 8.3* 8.3* 9.1 8.9  MG 1.9 1.9 2.0 2.0 1.9  AST 32 30 20 18 15   ALT 22 22 19 17 18   ALKPHOS 62 59 57 64 62  BILITOT 0.8 0.8 0.8 0.8 0.6   ------------------------------------------------------------------------------------------------------------------ No results for input(s): CHOL, HDL, LDLCALC, TRIG, CHOLHDL, LDLDIRECT in the last 72 hours.  No results found for: HGBA1C ------------------------------------------------------------------------------------------------------------------ No results for input(s): TSH, T4TOTAL, T3FREE, THYROIDAB in the last 72 hours.  Invalid input(s): FREET3 ------------------------------------------------------------------------------------------------------------------ Recent Labs    07/25/19 0221 07/26/19 0353  FERRITIN 91 97    Coagulation profile No results for input(s): INR, PROTIME in the last 168 hours.  Recent Labs    07/26/19 0353 07/27/19 0345  DDIMER 3.67* 3.13*    Cardiac Enzymes No results for input(s): CKMB, TROPONINI, MYOGLOBIN in the last 168 hours.  Invalid input(s): CK ------------------------------------------------------------------------------------------------------------------    Component Value Date/Time   BNP 633.2 (H) 07/26/2019 0430    Inpatient Medications  Scheduled Meds: . apixaban  10 mg Oral BID  . aspirin EC  81 mg Oral Daily   . atorvastatin  20 mg Oral Daily  . dexamethasone  6 mg Oral Daily  . metoprolol tartrate  50 mg Oral BID  . pantoprazole  40 mg Oral Daily  . sodium chloride flush  3 mL Intravenous Q12H  . sodium chloride flush  3 mL Intravenous Q12H   Continuous Infusions: . sodium chloride     PRN Meds:.sodium chloride, acetaminophen, HYDROcodone-acetaminophen, ondansetron **OR** ondansetron (ZOFRAN) IV, senna-docusate, sodium chloride, sodium chloride flush  Micro Results No results found for this or any previous visit (from the past 240 hour(s)).  Radiology Reports DG Chest Port 1 View  Result Date: 07/24/2019 CLINICAL DATA:  Dyspnea.  COVID-19. EXAM: PORTABLE CHEST 1 VIEW COMPARISON:  07/22/2019 from [facility]. FINDINGS: Patient rotated minimally right. Mild cardiomegaly. Atherosclerosis in the transverse aorta. No pleural effusion or pneumothorax. No lobar consolidation. Pulmonary interstitial thickening, felt to be accentuated by AP portable technique and diminished lung volumes. Areas of mild left infrahilar scarring are felt to be similar. IMPRESSION: Cardiomegaly with diminished lung volumes, chronic interstitial thickening. No convincing evidence of superimposed pneumonia. Aortic Atherosclerosis (ICD10-I70.0). Electronically Signed   By: Abigail Miyamoto M.D.   On: 07/24/2019 16:03   ECHOCARDIOGRAM LIMITED  Result Date: 07/23/2019   ECHOCARDIOGRAM LIMITED REPORT  Patient Name:   NOHELANI PECINA Date of Exam: 07/23/2019 Medical Rec #:  [id number]  Height:       65.0 in Accession #:    [id number] Weight:       249.2 lb Date of Birth:  1940/11/24  BSA:          2.17 m Patient Age:    85 years   BP:           115/91 mmHg Patient Gender: F          HR:           103 bpm. Exam Location:  Inpatient  Procedure: Limited Echo, Cardiac Doppler and Limited Color Doppler Indications:    I26.02 Pulmonary embolus  History:        Patient has prior history of Echocardiogram examinations, most                  recent 10/14/2018.  Sonographer:    Jonelle Sidle Dance Referring Phys: [id number] [place]  1. Left ventricular ejection fraction, by visual estimation, is 55 to 60%.  2. Global right ventricle has moderately reduced systolic function.The right ventricular size is moderately enlarged. no increase in right ventricular wall thickness.  3. The RV is moderately dilated with moderately reduced RV function.  4. Right atrial size was severely dilated.  5. The mitral valve is grossly normal. Mild mitral valve regurgitation.  6. The tricuspid valve was normal in structure. Tricuspid valve regurgitation is mild.  7. Tricuspid valve regurgitation is mild.  8. The pulmonic valve was normal in structure. Pulmonic valve regurgitation is mild by color flow Doppler.  9. Mildly elevated pulmonary artery systolic pressure. FINDINGS  Left Ventricle: Left ventricular ejection fraction, by visual estimation, is 55 to 60%. Right Ventricle: The right ventricular size is moderately enlarged. No increase in right ventricular wall thickness. Global RV systolic function is has moderately reduced systolic function. The tricuspid regurgitant velocity is 2.33 m/s, and with an assumed right atrial pressure of 15 mmHg, the estimated right ventricular systolic pressure is mildly elevated at 36.8 mmHg. The RV is moderately dilated with moderately reduced RV function. Right Atrium: Right atrial size was severely dilated. Right atrial pressure is estimated at 15 mmHg. Mitral Valve: The mitral valve is grossly normal. MV Area by PHT, 3.77 cm. MV PHT, 58.29 msec. Mild mitral valve regurgitation. Tricuspid Valve: The tricuspid valve is normal in structure. Tricuspid valve regurgitation is mild. Pulmonic Valve: The pulmonic valve was normal in structure. Pulmonic valve regurgitation is mild by color flow Doppler. Pulmonic regurgitation is mild by color flow Doppler. Aorta: The aortic root and ascending aorta are structurally normal, with no  evidence of dilitation.  LEFT VENTRICLE          Normals PLAX 2D LVIDd:         3.69 cm  3.6 cm LVIDs:         2.81 cm  1.7 cm LV PW:         1.37 cm  1.4 cm LV IVS:        1.02 cm  1.3 cm LVOT diam:     1.90 cm  2.0 cm LV SV:         28 ml    79 ml LV SV Index:   11.96    45 ml/m2 LVOT Area:     2.84 cm 3.14 cm2  RIGHT VENTRICLE          IVC RV  Basal diam:  3.55 cm  IVC diam: 2.57 cm RV Mid diam:    3.17 cm TAPSE (M-mode): 1.2 cm LEFT ATRIUM             Index       RIGHT ATRIUM           Index LA diam:        4.30 cm 1.98 cm/m  RA Area:     30.90 cm LA Vol (A2C):   67.1 ml 30.89 ml/m RA Volume:   111.00 ml 51.11 ml/m LA Vol (A4C):   68.8 ml 31.68 ml/m LA Biplane Vol: 68.0 ml 31.31 ml/m  AORTIC VALVE             Normals LVOT Vmax:   81.10 cm/s LVOT Vmean:  51.400 cm/s 75 cm/s LVOT VTI:    0.140 m     25.3 cm  AORTA                 Normals Ao Root diam: 2.90 cm 31 mm Ao Asc diam:  2.30 cm 31 mm MITRAL VALVE              Normals   TRICUSPID VALVE             Normals MV Area (PHT): 3.77 cm             TR Peak grad:   21.8 mmHg MV PHT:        58.29 msec 55 ms     TR Vmax:        261.00 cm/s 288 cm/s MV Decel Time: 201 msec   187 ms MV E velocity: 123.00 cm/s 103 cm/s SHUNTS                                     Systemic VTI:  0.14 m                                     Systemic Diam: 1.90 cm  Mertie Moores MD Electronically signed by Mertie Moores MD Signature Date/Time: 07/23/2019/5:33:58 PMThe mitral valve is grossly normal.    Final      Phillips Climes M.D on 07/27/2019 at 10:50 AM  Between 7am to 7pm - Pager - [phone]  After 7pm go to [email or web address] - password [facility]  [facility] -  Office  [phone]

## 2019-07-27 NOTE — Progress Notes (Signed)
Physical Therapy Treatment Patient Details Name: Miranda Moses MRN: [id number] DOB: [birth year] Today's Date: 07/27/2019    History of Present Illness 79 year old female admitted with Covid and extensive PE: PMH pre Diabetes, Pulmonary HTN, chronic diastolic CHF, A FIB.Cleared with RN Isidoro Donning secondary to the PE.- reportedly cleared, medically stable to Eval and treat.    PT Comments    Very pleasant female , receptive to PT, and was seated in [facility] chair when PT arrived. States that the [facility] was working fine today. She agreed to perform ex- practiced sit<>stand with RW. Indicated that her family was bring her clothes as she was anticipating discharge in next 1-2 days. She was able to perform LE strenthening in seated per HEP- good return demo. Also practiced pursed lip breathing. O2 sats are maintaining in mid 90's on room air. She should benefit from ongoing PT to address goals as outlined for optimal functional outcomes.   Follow Up Recommendations  Home health PT     Equipment Recommendations       Recommendations for Other Services       Precautions / Restrictions Precautions Precautions: Fall Precaution Comments: Monitor SpO2 & HR Restrictions Weight Bearing Restrictions: No    Mobility  Bed Mobility Overal bed mobility: Needs Assistance Bed Mobility: Supine to Sit     Supine to sit: Supervision     General bed mobility comments: HOB elevated, use of bedrail. Supervision for line management  Transfers Overall transfer level: Needs assistance Equipment used: Rolling walker (2 wheeled) Transfers: Stand Pivot Transfers;Sit to/from Stand Sit to Stand: Min guard Stand pivot transfers: Min guard       General transfer comment: Noted 0 instances of LOB, however pt unsteady on feet.  Ambulation/Gait Ambulation/Gait assistance: Min guard;Min assist Gait Distance (Feet): 8 Feet Assistive device: Rolling walker (2 wheeled) Gait Pattern/deviations: Shuffle;Wide base of  support     General Gait Details: Practiced sit<>stand x 3   Stairs             Wheelchair Mobility    Modified Rankin (Stroke Patients Only)       Balance Overall balance assessment: Mild deficits observed, not formally tested                                          Cognition Arousal/Alertness: Awake/alert Behavior During Therapy: WFL for tasks assessed/performed Overall Cognitive Status: Within Functional Limits for tasks assessed                                 General Comments: She was in chair when PT arrived. She states that the [facility] was working fine today. She relates that she anticipates discharge in next 1-2 days and family was bringing some clothes for her (they arrived at end of PT session)      Exercises Total Joint Exercises Ankle Circles/Pumps: AROM;Seated Quad Sets: AROM;Seated Short Arc Quad: AROM;Seated Hip ABduction/ADduction: AROM;Seated Marching in Standing: AROM;Seated Other Exercises Other Exercises: Encouraged pursed lip breathing throughout Other Exercises: Reminded to perform all UE and LE ex as issued during Evaluation.    General Comments General comments (skin integrity, edema, etc.): Continue to monitor skin and joint integrity, edema, and O2 sats.      Pertinent Vitals/Pain Pain Assessment: No/denies pain    Home Living  Prior Function            PT Goals (current goals can now be found in the care plan section) Acute Rehab PT Goals Patient Stated Goal: to go home PT Goal Formulation: With patient Time For Goal Achievement: 08/08/19 Potential to Achieve Goals: Good Progress towards PT goals: Progressing toward goals    Frequency    Min 3X/week      PT Plan Current plan remains appropriate    Co-evaluation              AM-PAC PT "6 Clicks" Mobility   Outcome Measure  Help needed turning from your back to your side while in a flat bed  without using bedrails?: A Little Help needed moving from lying on your back to sitting on the side of a flat bed without using bedrails?: A Little Help needed moving to and from a bed to a chair (including a wheelchair)?: A Little Help needed standing up from a chair using your arms (e.g., wheelchair or bedside chair)?: A Little Help needed to walk in hospital room?: A Little Help needed climbing 3-5 steps with a railing? : A Lot 6 Click Score: 17    End of Session   Activity Tolerance: Patient tolerated treatment well Patient left: in chair;with call bell/phone within reach   PT Visit Diagnosis: Muscle weakness (generalized) (M62.81)     Time: [id number] PT Time Calculation (min) (ACUTE ONLY): 34 min  Charges:  $Therapeutic Exercise: 23-37 mins         Rollen Sox, PT # [phone] CGV cell           Casandra Doffing 07/27/2019, 5:08 PM

## 2019-07-28 LAB — COMPREHENSIVE METABOLIC PANEL
ALT: 17 U/L (ref 0–44)
AST: 17 U/L (ref 15–41)
Albumin: 2.5 g/dL — ABNORMAL LOW (ref 3.5–5.0)
Alkaline Phosphatase: 67 U/L (ref 38–126)
Anion gap: 8 (ref 5–15)
BUN: 36 mg/dL — ABNORMAL HIGH (ref 8–23)
CO2: 27 mmol/L (ref 22–32)
Calcium: 8.8 mg/dL — ABNORMAL LOW (ref 8.9–10.3)
Chloride: 100 mmol/L (ref 98–111)
Creatinine, Ser: 0.95 mg/dL (ref 0.44–1.00)
GFR calc Af Amer: >60 mL/min (ref >60)
GFR calc non Af Amer: 58 mL/min — ABNORMAL LOW (ref >60)
Glucose, Bld: 193 mg/dL — ABNORMAL HIGH (ref 70–99)
Potassium: 4.7 mmol/L (ref 3.5–5.1)
Sodium: 135 mmol/L (ref 135–145)
Total Bilirubin: 0.5 mg/dL (ref 0.3–1.2)
Total Protein: 6.1 g/dL — ABNORMAL LOW (ref 6.5–8.1)

## 2019-07-28 LAB — CBC
HCT: 40.2 % (ref 36.0–46.0)
Hemoglobin: 13.1 g/dL (ref 12.0–15.0)
MCH: 27.4 pg (ref 26.0–34.0)
MCHC: 32.6 g/dL (ref 30.0–36.0)
MCV: 84.1 fL (ref 80.0–100.0)
Platelets: 367 10*3/uL (ref 150–400)
RBC: 4.78 MIL/uL (ref 3.87–5.11)
RDW: 13.9 % (ref 11.5–15.5)
WBC: 10.1 10*3/uL (ref 4.0–10.5)
nRBC: 0 % (ref 0.0–0.2)

## 2019-07-28 LAB — PROCALCITONIN: Procalcitonin: <0.1 ng/mL

## 2019-07-28 LAB — C-REACTIVE PROTEIN: CRP: 3.9 mg/dL — ABNORMAL HIGH (ref <1.0)

## 2019-07-28 MED ORDER — APIXABAN 5 MG PO TABS: 5.0000 mg | ORAL_TABLET | Freq: Two times a day (BID) | ORAL | Status: DC

## 2019-07-28 NOTE — Progress Notes (Signed)
Physical Therapy Treatment Patient Details Name: Miranda Moses MRN: [id number] DOB: 1940-10-31 Today's Date: 07/28/2019    History of Present Illness 79 year old female admitted with Covid and extensive PE: PMH pre Diabetes, Pulmonary HTN, chronic diastolic CHF, A FIB.Cleared with RN Isidoro Donning secondary to the PE.- reportedly cleared, medically stable to Eval and treat.    PT Comments    Very pleasant and motivated female patient. She was in recliner BS chair when PT arrived. Stated "she had had a good day- washing her hair, walking earlier," but was very tired. Indicates she was not sure when she would be going home- but her family had brought clothing for her yesterday. She agreed to perform ex- and did so -> the LE ex as outlined in HEP. Practiced pursed lip breathing throughout session, and O2 maintained above 93%+ No S/S of any shortness of breath. No adverse effects. Should continue to benefit from PT to further address goals to promote optimal functional outcomes. She is slowly, but steadily improving.  Follow Up Recommendations  Home health PT     Equipment Recommendations       Recommendations for Other Services       Precautions / Restrictions Precautions Precautions: Fall Precaution Comments: Monitor SpO2 & HR Restrictions Weight Bearing Restrictions: No    Mobility  Bed Mobility Overal bed mobility: Needs Assistance Bed Mobility: Supine to Sit     Supine to sit: Supervision     General bed mobility comments: Pt seated in bedside chair upon PT arrival  Transfers Overall transfer level: Needs assistance Equipment used: Rolling walker (2 wheeled)(Did not attempt ambulation this session as she was overly tired, but did agree to ex format.) Transfers: Stand Pivot Transfers;Sit to/from Stand Sit to Stand: Min guard Stand pivot transfers: Min guard       General transfer comment: Noted 0 instances of LOB  Ambulation/Gait                 Stairs              Wheelchair Mobility    Modified Rankin (Stroke Patients Only)       Balance Overall balance assessment: Mild deficits observed, not formally tested                                          Cognition Arousal/Alertness: Awake/alert Behavior During Therapy: WFL for tasks assessed/performed Overall Cognitive Status: Within Functional Limits for tasks assessed                                 General Comments: She states that she was not sure when she was leaving to go home- but "possibly by the weekend". States that she "was feeling pretty good, just tired this afternoon".      Exercises Total Joint Exercises Ankle Circles/Pumps: AROM;Seated;10 reps Quad Sets: AROM;Seated;10 reps Short Arc Quad: AROM;Seated;10 reps Heel Slides: AROM;Seated;10 reps Hip ABduction/ADduction: AROM;Seated;10 reps Marching in Standing: AROM;Seated;10 reps Other Exercises Other Exercises: Encouraged pursed lip breathing throughout Other Exercises: Reminded to perform all UE and LE ex as issued during Evaluation.    General Comments General comments (skin integrity, edema, etc.): Patient on room air, and SPO2 maintained > 93% throughout PT session. Monitor skin and joint integrity, as well as signs of edema. Mild swelling in feet/ankles  noted today.      Pertinent Vitals/Pain Pain Assessment: No/denies pain    Home Living                      Prior Function            PT Goals (current goals can now be found in the care plan section) Acute Rehab PT Goals Patient Stated Goal: to go home PT Goal Formulation: With patient Time For Goal Achievement: 08/08/19 Potential to Achieve Goals: Good Progress towards PT goals: Progressing toward goals(Slowly progressing, but motivated. Presents with good family support.)    Frequency    Min 3X/week      PT Plan Current plan remains appropriate    Co-evaluation              AM-PAC PT "6  Clicks" Mobility   Outcome Measure  Help needed turning from your back to your side while in a flat bed without using bedrails?: A Little Help needed moving from lying on your back to sitting on the side of a flat bed without using bedrails?: A Little Help needed moving to and from a bed to a chair (including a wheelchair)?: A Little Help needed standing up from a chair using your arms (e.g., wheelchair or bedside chair)?: A Little Help needed to walk in hospital room?: A Little Help needed climbing 3-5 steps with a railing? : A Lot 6 Click Score: 17    End of Session   Activity Tolerance: Patient tolerated treatment well Patient left: in chair;with call bell/phone within reach(BS table over lap, patient reclined, and blanket covering lap)   PT Visit Diagnosis: Muscle weakness (generalized) (M62.81)     Time: [id number] PT Time Calculation (min) (ACUTE ONLY): 29 min  Charges:  $Therapeutic Exercise: 23-37 mins                   Rollen Sox, PT # [phone] CGV cell   Casandra Doffing 07/28/2019, 4:41 PM

## 2019-07-28 NOTE — Progress Notes (Addendum)
Updates given to daughter.

## 2019-07-28 NOTE — TOC Progression Note (Signed)
Transition of Care [facility]) - Progression Note    Patient Details  Name: Miranda Moses MRN: [id number] Date of Birth: [birth year]  [facility] CM/SW Contact  Shade Flood, LCSW Phone Number: 07/28/2019, 11:31 AM  Clinical Narrative:     TOC following. Received consult for the Meds to Bed program and insurance verification. Spoke with Pharmacist, Nicki Reaper, regarding the consult. Per Nicki Reaper, since pt has prescription insurance information on file, there is not actually anything TOC needs to do at this time.   Will clear this consult and will continue to follow for dc planning needs.  Expected Discharge Plan: [place] Barriers to Discharge: Continued Medical Work up  Expected Discharge Plan and Services Expected Discharge Plan: [place] Choice: [place] arrangements for the past 2 months: Single Family Home                 DME Arranged: N/A DME Agency: NA       HH Arranged: RN, PT, OT HH Agency: [place] Date [place]: 07/26/19 Time [place]: 1559 Representative spoke with at [place]: [place] ([place]) Interventions    Readmission Risk Interventions No flowsheet data found.

## 2019-07-28 NOTE — Progress Notes (Signed)
Occupational Therapy Treatment Patient Details Name: Miranda Moses MRN: [id number] DOB: [birth year] Today's Date: 07/28/2019    History of present illness 79 year old female admitted with Covid and extensive PE: PMH pre Diabetes, Pulmonary HTN, chronic diastolic CHF, A FIB.Cleared with RN Isidoro Donning secondary to the PE.- reportedly cleared, medically stable to Eval and treat.   OT comments  Pt making progress in therapy, demonstrating improved independence and activity tolerance with self-care and functional transfer tasks. Educated/instructed on energy conservation techniques and fall prevention strategies with good understanding. Pt able to ambulate to/from bathroom with RW and min guard, noting 0 instances of loss of balance. Pt completed toileting task, demonstrating improved safety and independence with transfer. Pt tolerated standing 1 x 7.5 min and 1 x 5.5 min at the sink to complete grooming, hygiene, sponge bathing task. Pt required 1 mod seated rest break due to shortness of breath. Pt on room air with SpO2 maintaining in 90s throughout. RN updated. OT will continue to follow acutely.    Follow Up Recommendations  Home health OT;Supervision/Assistance - 24 hour    Equipment Recommendations  None recommended by OT    Recommendations for Other Services      Precautions / Restrictions Precautions Precautions: Fall Restrictions Weight Bearing Restrictions: No       Mobility Bed Mobility               General bed mobility comments: Pt seated in bedside chair upon OT arrival  Transfers Overall transfer level: Needs assistance Equipment used: Rolling walker (2 wheeled) Transfers: Stand Pivot Transfers;Sit to/from Stand Sit to Stand: Min guard Stand pivot transfers: Min guard       General transfer comment: Noted 0 instances of LOB    Balance Overall balance assessment: Mild deficits observed, not formally tested                                        ADL either performed or assessed with clinical judgement   ADL Overall ADL's : Needs assistance/impaired     Grooming: Set up;Supervision/safety;Standing   Upper Body Bathing: Supervision/ safety;Set up;Standing   Lower Body Bathing: Min guard(while standing at the sink)           Toilet Transfer: Supervision/safety;Ambulation;Regular Toilet;Grab bars   Toileting- Clothing Manipulation and Hygiene: Supervision/safety;Min guard;Sit to/from stand       Functional mobility during ADLs: Min guard;Rolling walker General ADL Comments: Pt able to ambulate to/from bathroom with RW, noting 0 instances of LOB.     Vision       Perception     Praxis      Cognition Arousal/Alertness: Awake/alert Behavior During Therapy: WFL for tasks assessed/performed Overall Cognitive Status: Within Functional Limits for tasks assessed                                          Exercises Exercises: Other exercises Other Exercises Other Exercises: Encouraged pursed lip breathing throughout   Shoulder Instructions       General Comments Pt on room air with SpO2 maintaining in 90s throughout.    Pertinent Vitals/ Pain       Pain Assessment: No/denies pain  Home Living  Prior Functioning/Environment              Frequency           Progress Toward Goals  OT Goals(current goals can now be found in the care plan section)  Progress towards OT goals: Progressing toward goals  ADL Goals Pt Will Perform Grooming: with modified independence;standing Pt Will Perform Lower Body Bathing: with modified independence;sit to/from stand Pt Will Perform Lower Body Dressing: with modified independence;sit to/from stand Pt Will Transfer to Toilet: with modified independence;ambulating;regular height toilet Pt Will Perform Toileting - Clothing Manipulation and hygiene: with modified independence;sit to/from  stand Additional ADL Goal #1: Pt to recall and verbalize 3 fall prevention strategies with 0 verbal cues. Additional ADL Goal #2: Pt to recall and verbalize 3 energy conservation strategies with 0 verbal cues. Additional ADL Goal #3: Pt to tolerate standing up to 10 min with modified independence, in preparation for ADLs.  Plan Discharge plan remains appropriate    Co-evaluation                 AM-PAC OT "6 Clicks" Daily Activity     Outcome Measure   Help from another person eating meals?: None Help from another person taking care of personal grooming?: A Little Help from another person toileting, which includes using toliet, bedpan, or urinal?: A Little Help from another person bathing (including washing, rinsing, drying)?: A Little Help from another person to put on and taking off regular upper body clothing?: A Little Help from another person to put on and taking off regular lower body clothing?: A Little 6 Click Score: 19    End of Session Equipment Utilized During Treatment: Rolling walker  OT Visit Diagnosis: Unsteadiness on feet (R26.81);Muscle weakness (generalized) (M62.81)   Activity Tolerance Patient tolerated treatment well   Patient Left in chair;with call bell/phone within reach   Nurse Communication Mobility status        Time: [id number] OT Time Calculation (min): 45 min  Charges: OT General Charges $OT Visit: 1 Visit OT Treatments $Self Care/Home Management : 23-37 mins $Therapeutic Activity: 8-22 mins  Mauri Brooklyn OTR/L [phone]   Mauri Brooklyn 07/28/2019, 1:51 PM

## 2019-07-28 NOTE — Progress Notes (Signed)
PROGRESS NOTE  Miranda Moses [id number] DOB: 1940-12-26 DOA: 07/23/2019 PCP: [name], PA-C   LOS: 5 days   Brief Narrative / Interim history: 79 year old female with HTN, prediabetes, PAH, chronic diastolic CHF, CAD, [id number] diagnosed 07/09/2019 came in with fatigue, lightheadedness, malaise.  She was found to be in A. fib with RVR on admission with rates into the 140s, hypotensive with blood pressure into the 90s.  D-dimer was elevated and CT angiogram on admission showed extensive bilateral PEs with evidence of right heart strain.  She was given fluids, diltiazem and started on heparin and transferred to [facility] for further management  Subjective / 24h Interval events: She is doing well this morning, denies any chest pain, no palpitations.  Denies any shortness of breath.  Assessment & Plan:  Principal Problem Acute pulmonary embolism -patient on admission showed to have an elevated D-dimer and CT angiogram showed extensive bilateral PEs with right heart strain.  She was treated with heparin infusion for 72 hours and now transition to Eliquis and appears to be stable and on room air.  Active Problems COVID-19 pneumonia -initially diagnosed on 1/15, CT angiogram showed hazy groundglass opacities, she has finished a course of remdesivir while hospitalized and currently she is on steroids which she needs for total of 10 days.  Inflammatory markers improving  COVID-19 Labs  Recent Labs    07/26/19 0353 07/27/19 0345 07/28/19 0252  DDIMER 3.67* 3.13*  --   FERRITIN 97  --   --   CRP 14.4* 8.2* 3.9*    No results found for: [place] onset A. fib with RVR-she was initially requiring Cardizem drip, now transition to metoprolol 50 twice daily.  Soft blood pressures have made rate control slightly difficult as AV nodal blocking agents cannot be uptitrated significantly.  Cardiology has been consulted, patient was started on digoxin which he is to continue along with  metoprolol.  Heart rate on my evaluation ranging between 80s-90s at rest, will mobilize patient today and monitor rate.  She is anticoagulated on Eliquis as above  Chronic diastolic CHF-appears somewhat euvolemic today, hold further Lasix on evaluate on a daily basis  Coronary artery disease-no angina, she has been intolerant of statins, continue aspirin  Hypertension -blood pressure remains soft, continue to hold home medications  Scheduled Meds: . apixaban  10 mg Oral BID  . [START ON 08/02/2019] apixaban  5 mg Oral BID  . aspirin EC  81 mg Oral Daily  . atorvastatin  20 mg Oral Daily  . dexamethasone  6 mg Oral Daily  . digoxin  0.125 mg Oral Daily  . furosemide  20 mg Intravenous BID  . metoprolol tartrate  50 mg Oral BID  . pantoprazole  40 mg Oral Daily  . sodium chloride flush  3 mL Intravenous Q12H  . sodium chloride flush  3 mL Intravenous Q12H   Continuous Infusions: . sodium chloride     PRN Meds:.sodium chloride, acetaminophen, HYDROcodone-acetaminophen, ondansetron **OR** ondansetron (ZOFRAN) IV, senna-docusate, sodium chloride, sodium chloride flush  DVT prophylaxis: Eliquis Code Status: Full code Family Communication: d/w patient  Patient admitted from: home  Anticipated d/c place: home Barriers to d/c: A. fib with RVR improvement in her heart rate prior to discharge  Consultants:  Cardiology  Procedures:  2D echo:  IMPRESSIONS    1. Left ventricular ejection fraction, by visual estimation, is 55 to  60%.  2. Global right ventricle has moderately reduced systolic function.The  right ventricular size is moderately  enlarged. no increase in right  ventricular wall thickness.  3. The RV is moderately dilated with moderately reduced RV function.  4. Right atrial size was severely dilated.  5. The mitral valve is grossly normal. Mild mitral valve regurgitation.  6. The tricuspid valve was normal in structure. Tricuspid valve  regurgitation is mild.  7.  Tricuspid valve regurgitation is mild.  8. The pulmonic valve was normal in structure. Pulmonic valve  regurgitation is mild by color flow Doppler.  9. Mildly elevated pulmonary artery systolic pressure.   Microbiology: None   Antimicrobials: None    Objective: Vitals:   07/27/19 1904 07/27/19 2018 07/28/19 0318 07/28/19 0902  BP:  119/86 (!) 103/53 103/81  Pulse:    (!) 41  Resp: 19 18 18 14   Temp:  97.9 F (36.6 C) 98.2 F (36.8 C)   TempSrc:  Oral Oral   SpO2:  100% 96% 96%  Weight:      Height:        Intake/Output Summary (Last 24 hours) at 07/28/2019 1021 Last data filed at 07/28/2019 0300 Gross per 24 hour  Intake --  Output 900 ml  Net -900 ml   Filed Weights   07/23/19 0602  Weight: 113 kg    Examination:  Constitutional: NAD Eyes: no scleral icterus ENMT: Mucous membranes are moist.  Neck: normal, supple Respiratory: clear to auscultation bilaterally, no wheezing, no crackles. Normal respiratory effort. Cardiovascular: Irregular, no murmurs.  Trace edema Abdomen: non distended, no tenderness. Bowel sounds positive.  Musculoskeletal: no clubbing / cyanosis.  Skin: no rashes Neurologic: CN 2-12 grossly intact. Strength 5/5 in all 4.   Data Reviewed: I have independently reviewed following labs and imaging studies   CBC: Recent Labs  Lab 07/23/19 0651 07/24/19 0302 07/25/19 0221 07/26/19 0353 07/28/19 0252  WBC 5.3 6.0 7.2 8.2 10.1  NEUTROABS 3.9 4.6 5.3 7.1  --   HGB 11.6* 11.4* 11.4* 12.9 13.1  HCT 36.3 35.7* 36.0 40.4 40.2  MCV 86.2 86.7 86.7 86.5 84.1  PLT 136* 167 196 247 [id number]   Basic Metabolic Panel: Recent Labs  Lab 07/23/19 0651 07/23/19 0651 07/24/19 0302 07/25/19 0221 07/26/19 0353 07/27/19 0345 07/28/19 0252  NA 135   < > 138 135 136 136 135  K 3.7   < > 3.8 4.3 4.9 4.3 4.7  CL 104   < > 105 105 105 101 100  CO2 18*   < > 23 22 23 27 27   GLUCOSE 107*   < > 99 100* 160* 171* 193*  BUN 24*   < > 26* 24* 25* 32* 36*   CREATININE 0.84   < > 0.86 0.85 0.79 0.85 0.95  CALCIUM 8.0*   < > 8.3* 8.3* 9.1 8.9 8.8*  MG 1.9  --  1.9 2.0 2.0 1.9  --    < > = values in this interval not displayed.   GFR: Estimated Creatinine Clearance: 63.1 mL/min (by C-G formula based on SCr of 0.95 mg/dL). Liver Function Tests: Recent Labs  Lab 07/24/19 0302 07/25/19 0221 07/26/19 0353 07/27/19 0345 07/28/19 0252  AST 30 20 18 15 17   ALT 22 19 17 18 17   ALKPHOS 59 57 64 62 67  BILITOT 0.8 0.8 0.8 0.6 0.5  PROT 6.1* 6.0* 6.5 6.5 6.1*  ALBUMIN 2.9* 2.7* 2.9* 2.7* 2.5*   No results for input(s): LIPASE, AMYLASE in the last 168 hours. No results for input(s): AMMONIA in the last 168 hours. Coagulation Profile:  No results for input(s): INR, PROTIME in the last 168 hours. Cardiac Enzymes: No results for input(s): CKTOTAL, CKMB, CKMBINDEX, TROPONINI in the last 168 hours. BNP (last 3 results) Recent Labs    08/28/18 1050  PROBNP 154   HbA1C: No results for input(s): HGBA1C in the last 72 hours. CBG: No results for input(s): GLUCAP in the last 168 hours. Lipid Profile: No results for input(s): CHOL, HDL, LDLCALC, TRIG, CHOLHDL, LDLDIRECT in the last 72 hours. Thyroid Function Tests: No results for input(s): TSH, T4TOTAL, FREET4, T3FREE, THYROIDAB in the last 72 hours. Anemia Panel: Recent Labs    07/26/19 0353  FERRITIN 97   Urine analysis: No results found for: COLORURINE, APPEARANCEUR, LABSPEC, PHURINE, GLUCOSEU, HGBUR, BILIRUBINUR, KETONESUR, PROTEINUR, UROBILINOGEN, NITRITE, LEUKOCYTESUR Sepsis Labs: Invalid input(s): PROCALCITONIN, LACTICIDVEN  No results found for this or any previous visit (from the past 240 hour(s)).    Radiology Studies: No results found.   Marzetta Board, MD, PhD [facility]  Between 7 am - 7 pm I am available, please contact me via Amion or Securechat  Between 7 pm - 7 am I am not available, please contact night coverage MD/APP via Amion

## 2019-07-29 LAB — C-REACTIVE PROTEIN: CRP: 1.6 mg/dL — ABNORMAL HIGH (ref <1.0)

## 2019-07-29 LAB — COMPREHENSIVE METABOLIC PANEL
ALT: 20 U/L (ref 0–44)
AST: 17 U/L (ref 15–41)
Albumin: 2.8 g/dL — ABNORMAL LOW (ref 3.5–5.0)
Alkaline Phosphatase: 58 U/L (ref 38–126)
Anion gap: 10 (ref 5–15)
BUN: 46 mg/dL — ABNORMAL HIGH (ref 8–23)
CO2: 28 mmol/L (ref 22–32)
Calcium: 8.7 mg/dL — ABNORMAL LOW (ref 8.9–10.3)
Chloride: 95 mmol/L — ABNORMAL LOW (ref 98–111)
Creatinine, Ser: 1.04 mg/dL — ABNORMAL HIGH (ref 0.44–1.00)
GFR calc Af Amer: >60 mL/min (ref >60)
GFR calc non Af Amer: 52 mL/min — ABNORMAL LOW (ref >60)
Glucose, Bld: 201 mg/dL — ABNORMAL HIGH (ref 70–99)
Potassium: 4.9 mmol/L (ref 3.5–5.1)
Sodium: 133 mmol/L — ABNORMAL LOW (ref 135–145)
Total Bilirubin: 0.8 mg/dL (ref 0.3–1.2)
Total Protein: 5.9 g/dL — ABNORMAL LOW (ref 6.5–8.1)

## 2019-07-29 MED ORDER — APIXABAN 5 MG PO TABS: ORAL_TABLET | ORAL | 0 refills | Status: DC

## 2019-07-29 MED FILL — ELIQUIS 5 MG TABLET: 5 | 30 days supply | Qty: 66 | Fill #0

## 2019-07-29 NOTE — TOC Progression Note (Signed)
Transition of Care [facility]) - Progression Note    Patient Details  Name: Miranda Moses MRN: [id number] Date of Birth: Oct 10, 1940  [facility] CM/SW Contact  Shade Flood, LCSW Phone Number: 07/29/2019, 10:42 AM  Clinical Narrative:     TOC following. MD notes indicate pt may possibly be stable for dc home tomorrow. Plan remains for dc home with [facility]. Updated Ronalee Belts at [place].  Will follow up tomorrow.  Expected Discharge Plan: [place] Barriers to Discharge: Continued Medical Work up  Expected Discharge Plan and Services Expected Discharge Plan: [place]: [place] arrangements for the past 2 months: Single Family Home                 DME Arranged: N/A DME Agency: NA       HH Arranged: RN, PT, OT HH Agency: [place] Date [place]: 07/26/19 Time [place]: 1559 Representative spoke with at [place]: [place] Interventions    Readmission Risk Interventions No flowsheet data found.

## 2019-07-29 NOTE — Progress Notes (Signed)
Called patients daughter, Newt Minion, and gave her an update on patients status, POC, and discharge plan. All questions answered at this time.

## 2019-07-29 NOTE — Progress Notes (Signed)
Patient will be receiving Eliquis medication to take home with her upon discharge as part of [facility] program. Family, daughter Malachy Mood, has been made aware. Pharmacist reports that medication is here at [facility] and will be left with charge nurse on day of discharge.

## 2019-07-29 NOTE — Progress Notes (Signed)
Physical Therapy Treatment Patient Details Name: Miranda Moses MRN: [id number] DOB: Oct 21, 1940 Today's Date: 07/29/2019    History of Present Illness 79 year old female admitted with Covid and extensive PE: PMH pre Diabetes, Pulmonary HTN, chronic diastolic CHF, A FIB.Cleared with RN Isidoro Donning secondary to the PE.- reportedly cleared, medically stable to Eval and treat.    PT Comments    Pt did well with tx this am. Pt able to complete all mobility with min/mod a, was able to sit unsupported edge of bed and complete clean up, incident of urinary incontinence, able to stand from bed and recliner with RW and SBA, ambulated 2 x 72ft w/ RW and min a. W/ all activity on room air pt able to maintain sats in 90s, but noted became tachycardic, w/ HR increasing to 140s with just sit to stand. Pt needed and received increased rest time between tasks to recover, but was able to complete all tasks given this session, including seated exercises and breathing exercises using incentive spirometer and flutter valve.    Follow Up Recommendations  Home health PT     Equipment Recommendations  Rolling walker with 5" wheels    Recommendations for Other Services       Precautions / Restrictions Precautions Precautions: Fall Restrictions Weight Bearing Restrictions: No    Mobility  Bed Mobility Overal bed mobility: Needs Assistance Bed Mobility: Supine to Sit     Supine to sit: Supervision     General bed mobility comments: needs line management and also set up to complete  Transfers Overall transfer level: Needs assistance Equipment used: Rolling walker (2 wheeled) Transfers: Sit to/from Stand Sit to Stand: Supervision            Ambulation/Gait Ambulation/Gait assistance: Min guard;Supervision Gait Distance (Feet): 48 Feet Assistive device: Rolling walker (2 wheeled) Gait Pattern/deviations: Wide base of support;Step-through pattern     General Gait Details: able to ambulate 2 x  25ft with seated rest between each attempt pt 02 sats on room air remained in 90s but pt became tachycardic with any mobility   Stairs             Wheelchair Mobility    Modified Rankin (Stroke Patients Only)       Balance Overall balance assessment: Mild deficits observed, not formally tested                                          Cognition Arousal/Alertness: Awake/alert Behavior During Therapy: WFL for tasks assessed/performed Overall Cognitive Status: No family/caregiver present to determine baseline cognitive functioning                                 General Comments: seems to have some slow processing      Exercises General Exercises - Lower Extremity Ankle Circles/Pumps: AROM;10 reps;Seated Long Arc Quad: AROM;10 reps;Seated Hip ABduction/ADduction: AROM;10 reps;Seated Hip Flexion/Marching: AROM;10 reps;Seated Other Exercises Other Exercises: incentive spirometer x 10 Other Exercises: flutter valve x 10    General Comments        Pertinent Vitals/Pain Pain Assessment: Faces Faces Pain Scale: No hurt    Home Living                      Prior Function  PT Goals (current goals can now be found in the care plan section) Acute Rehab PT Goals Patient Stated Goal: to go home PT Goal Formulation: With patient Time For Goal Achievement: 08/08/19 Potential to Achieve Goals: Good Progress towards PT goals: Progressing toward goals    Frequency    Min 3X/week      PT Plan Current plan remains appropriate    Co-evaluation              AM-PAC PT "6 Clicks" Mobility   Outcome Measure  Help needed turning from your back to your side while in a flat bed without using bedrails?: A Little Help needed moving from lying on your back to sitting on the side of a flat bed without using bedrails?: A Little Help needed moving to and from a bed to a chair (including a wheelchair)?: A Little Help  needed standing up from a chair using your arms (e.g., wheelchair or bedside chair)?: A Little Help needed to walk in hospital room?: A Little Help needed climbing 3-5 steps with a railing? : A Lot 6 Click Score: 17    End of Session Equipment Utilized During Treatment: Gait belt Activity Tolerance: Patient limited by fatigue;Patient limited by lethargy Patient left: in chair;with call bell/phone within reach Nurse Communication: Mobility status PT Visit Diagnosis: Muscle weakness (generalized) (M62.81)     Time: [id number] PT Time Calculation (min) (ACUTE ONLY): 50 min  Charges:  $Gait Training: 8-22 mins $Therapeutic Exercise: 8-22 mins $Therapeutic Activity: 8-22 mins                     Horald Chestnut, PT    Delford Field 07/29/2019, 1:13 PM

## 2019-07-29 NOTE — Progress Notes (Signed)
PROGRESS NOTE  Miranda Moses [id number] DOB: 1941/05/14 DOA: 07/23/2019 PCP: [name], PA-C   LOS: 6 days   Brief Narrative / Interim history: 79 year old female with HTN, prediabetes, PAH, chronic diastolic CHF, CAD, [id number] diagnosed 07/09/2019 came in with fatigue, lightheadedness, malaise.  She was found to be in A. fib with RVR on admission with rates into the 140s, hypotensive with blood pressure into the 90s.  D-dimer was elevated and CT angiogram on admission showed extensive bilateral PEs with evidence of right heart strain.  She was given fluids, diltiazem and started on heparin and transferred to [facility] for further management  Subjective / 24h Interval events: No chest pain, no palpitations.  No shortness of breath.  No lightheadedness or dizziness.  She still feeling weak but overall stronger in the last few days  Assessment & Plan:  Principal Problem Acute pulmonary embolism -patient on admission showed to have an elevated D-dimer and CT angiogram showed extensive bilateral PEs with right heart strain.  She was treated with heparin infusion for 72 hours and now transition to Eliquis and appears to be stable and on room air.  Continue Eliquis, she is on 10 mg twice daily up until 2/8 when she will be transitioned to 5 twice daily.  Active Problems COVID-19 pneumonia -initially diagnosed on 1/15, CT angiogram showed hazy groundglass opacities, she has finished a course of remdesivir while hospitalized and currently she is on steroids which she needs for total of 10 days.  Inflammatory markers improving  COVID-19 Labs  Recent Labs    07/27/19 0345 07/28/19 0252 07/29/19 0341  DDIMER 3.13*  --   --   CRP 8.2* 3.9* 1.6*    No results found for: [place] onset A. fib with RVR-she was initially requiring Cardizem drip, now transition to metoprolol 50 twice daily.  Soft blood pressures have made rate control slightly difficult as AV nodal blocking agents cannot be  uptitrated significantly.  Cardiology has been consulted, patient was started on digoxin which he is to continue along with metoprolol.  Heart rate appears between 80-90 at rest, going up to 105 with activity which is acceptable.  She did have few episodes of heart rate into the 40s while asleep, asymptomatic.  No significant pauses.  Continue current regimen, if telemetry remains stable will anticipate to go home within 24 hours  Chronic diastolic CHF-remains euvolemic, hold Lasix today given slight elevation in creatinine  Coronary artery disease-no angina, she has been intolerant of statins, continue aspirin  Hypertension -blood pressure within normal parameters on metoprolol, Lasix on hold as above.  Scheduled Meds: . apixaban  10 mg Oral BID  . [START ON 08/02/2019] apixaban  5 mg Oral BID  . aspirin EC  81 mg Oral Daily  . atorvastatin  20 mg Oral Daily  . dexamethasone  6 mg Oral Daily  . digoxin  0.125 mg Oral Daily  . metoprolol tartrate  50 mg Oral BID  . pantoprazole  40 mg Oral Daily  . sodium chloride flush  3 mL Intravenous Q12H  . sodium chloride flush  3 mL Intravenous Q12H   Continuous Infusions: . sodium chloride     PRN Meds:.sodium chloride, acetaminophen, HYDROcodone-acetaminophen, ondansetron **OR** ondansetron (ZOFRAN) IV, senna-docusate, sodium chloride, sodium chloride flush  DVT prophylaxis: Eliquis Code Status: Full code Family Communication: d/w patient  Patient admitted from: home  Anticipated d/c place: home Barriers to d/c: A. fib with RVR improvement in her heart rate prior to discharge  Consultants:  Cardiology  Procedures:  2D echo:  IMPRESSIONS    1. Left ventricular ejection fraction, by visual estimation, is 55 to  60%.  2. Global right ventricle has moderately reduced systolic function.The  right ventricular size is moderately enlarged. no increase in right  ventricular wall thickness.  3. The RV is moderately dilated with moderately  reduced RV function.  4. Right atrial size was severely dilated.  5. The mitral valve is grossly normal. Mild mitral valve regurgitation.  6. The tricuspid valve was normal in structure. Tricuspid valve  regurgitation is mild.  7. Tricuspid valve regurgitation is mild.  8. The pulmonic valve was normal in structure. Pulmonic valve  regurgitation is mild by color flow Doppler.  9. Mildly elevated pulmonary artery systolic pressure.   Microbiology: None   Antimicrobials: None    Objective: Vitals:   07/28/19 2125 07/29/19 0020 07/29/19 0329 07/29/19 0758  BP: (!) 124/112   (!) 108/54  Pulse:      Resp:      Temp:  98.2 F (36.8 C) 98 F (36.7 C) 98.1 F (36.7 C)  TempSrc:  Oral Oral Oral  SpO2:      Weight:      Height:        Intake/Output Summary (Last 24 hours) at 07/29/2019 1020 Last data filed at 07/29/2019 0400 Gross per 24 hour  Intake 260 ml  Output 700 ml  Net -440 ml   Filed Weights   07/23/19 0602  Weight: 113 kg    Examination:  Constitutional: No distress, eating breakfast Eyes: No scleral icterus ENMT: Moist mucous membranes Neck: normal, supple Respiratory: Clear bilaterally, no wheezing, no crackles, normal respiratory effort Cardiovascular: Irregularly irregular, no murmurs.  Trace edema Abdomen: Soft, nontender, nondistended, bowel sounds positive Musculoskeletal: no clubbing / cyanosis.  Skin: No rashes seen Neurologic: Nonfocal, equal strength  Data Reviewed: I have independently reviewed following labs and imaging studies   CBC: Recent Labs  Lab 07/23/19 0651 07/24/19 0302 07/25/19 0221 07/26/19 0353 07/28/19 0252  WBC 5.3 6.0 7.2 8.2 10.1  NEUTROABS 3.9 4.6 5.3 7.1  --   HGB 11.6* 11.4* 11.4* 12.9 13.1  HCT 36.3 35.7* 36.0 40.4 40.2  MCV 86.2 86.7 86.7 86.5 84.1  PLT 136* 167 196 247 [id number]   Basic Metabolic Panel: Recent Labs  Lab 07/23/19 0651 07/23/19 0651 07/24/19 0302 07/24/19 0302 07/25/19 0221 07/26/19 0353  07/27/19 0345 07/28/19 0252 07/29/19 0341  NA 135   < > 138   < > 135 136 136 135 133*  K 3.7   < > 3.8   < > 4.3 4.9 4.3 4.7 4.9  CL 104   < > 105   < > 105 105 101 100 95*  CO2 18*   < > 23   < > 22 23 27 27 28   GLUCOSE 107*   < > 99   < > 100* 160* 171* 193* 201*  BUN 24*   < > 26*   < > 24* 25* 32* 36* 46*  CREATININE 0.84   < > 0.86   < > 0.85 0.79 0.85 0.95 1.04*  CALCIUM 8.0*   < > 8.3*   < > 8.3* 9.1 8.9 8.8* 8.7*  MG 1.9  --  1.9  --  2.0 2.0 1.9  --   --    < > = values in this interval not displayed.   GFR: Estimated Creatinine Clearance: 57.7 mL/min (A) (by C-G formula based  on SCr of 1.04 mg/dL (H)). Liver Function Tests: Recent Labs  Lab 07/25/19 0221 07/26/19 0353 07/27/19 0345 07/28/19 0252 07/29/19 0341  AST 20 18 15 17 17   ALT 19 17 18 17 20   ALKPHOS 57 64 62 67 58  BILITOT 0.8 0.8 0.6 0.5 0.8  PROT 6.0* 6.5 6.5 6.1* 5.9*  ALBUMIN 2.7* 2.9* 2.7* 2.5* 2.8*   No results for input(s): LIPASE, AMYLASE in the last 168 hours. No results for input(s): AMMONIA in the last 168 hours. Coagulation Profile: No results for input(s): INR, PROTIME in the last 168 hours. Cardiac Enzymes: No results for input(s): CKTOTAL, CKMB, CKMBINDEX, TROPONINI in the last 168 hours. BNP (last 3 results) Recent Labs    08/28/18 1050  PROBNP 154   HbA1C: No results for input(s): HGBA1C in the last 72 hours. CBG: No results for input(s): GLUCAP in the last 168 hours. Lipid Profile: No results for input(s): CHOL, HDL, LDLCALC, TRIG, CHOLHDL, LDLDIRECT in the last 72 hours. Thyroid Function Tests: No results for input(s): TSH, T4TOTAL, FREET4, T3FREE, THYROIDAB in the last 72 hours. Anemia Panel: No results for input(s): VITAMINB12, FOLATE, FERRITIN, TIBC, IRON, RETICCTPCT in the last 72 hours. Urine analysis: No results found for: COLORURINE, APPEARANCEUR, LABSPEC, PHURINE, GLUCOSEU, HGBUR, BILIRUBINUR, KETONESUR, PROTEINUR, UROBILINOGEN, NITRITE, LEUKOCYTESUR Sepsis  Labs: Invalid input(s): PROCALCITONIN, LACTICIDVEN  No results found for this or any previous visit (from the past 240 hour(s)).    Radiology Studies: No results found.   Marzetta Board, MD, PhD [facility]  Between 7 am - 7 pm I am available, please contact me via Amion or Securechat  Between 7 pm - 7 am I am not available, please contact night coverage MD/APP via Amion

## 2019-07-30 ENCOUNTER — Encounter (HOSPITAL_COMMUNITY): Payer: Self-pay

## 2019-07-30 MED ORDER — METOPROLOL TARTRATE 25 MG PO TABS: 25.0000 mg | ORAL_TABLET | Freq: Two times a day (BID) | ORAL | 1 refills | Status: DC

## 2019-07-30 MED ORDER — DIGOXIN 125 MCG PO TABS: 0.1250 mg | ORAL_TABLET | Freq: Every day | ORAL | 0 refills | Status: DC

## 2019-07-30 MED ORDER — FUROSEMIDE 40 MG PO TABS: 40.0000 mg | ORAL_TABLET | Freq: Every day | ORAL | 0 refills | Status: DC | PRN

## 2019-07-30 MED ORDER — DEXAMETHASONE 6 MG PO TABS: 6.0000 mg | ORAL_TABLET | Freq: Every day | ORAL | 0 refills | Status: DC

## 2019-07-30 NOTE — TOC Transition Note (Signed)
Transition of Care [facility]) - CM/SW Discharge Note   Patient Details  Name: Miranda Moses MRN: [id number] Date of Birth: 1940/07/23  Transition of Care [facility]) CM/SW Contact:  Shade Flood, LCSW Phone Number: 07/30/2019, 11:06 AM   Clinical Narrative:     Pt stable for dc per MD. Plan remains for return to home with [facility] to follow. Information added to AVS. Updated Ronalee Belts at [place] of pt's dc and they will follow up.  There are no other TOC needs identified for dc.  Final next level of care: [place] Barriers to Discharge: Barriers Resolved   Patient Goals and CMS Choice Patient states their goals for this hospitalization and ongoing recovery are:: To return back home with home health. CMS Medicare.gov Compare Post Acute Care list provided to:: Patient Represenative (must comment) Choice offered to / list presented to : Adult Children  Discharge Placement                       Discharge Plan and Services     Post Acute Care Choice: Home Health          DME Arranged: N/A DME Agency: NA       HH Arranged: RN, PT, OT HH Agency: [place] Date [facility] Agency Contacted: 07/26/19 Time [place]: [id number] Representative spoke with at [place]: [place] Determinants of Health ([place]) Interventions     Readmission Risk Interventions No flowsheet data found.

## 2019-07-30 NOTE — Progress Notes (Signed)
Completed discharge teaching and removed PIV.  Packed up belongings and removed monitoring devices.  Family is on the way.

## 2019-07-30 NOTE — Progress Notes (Signed)
Occupational Therapy Treatment Patient Details Name: Miranda Moses MRN: [id number] DOB: Aug 04, 1940 Today's Date: 07/30/2019    History of present illness 79 year old female admitted with Covid and extensive PE: PMH pre Diabetes, Pulmonary HTN, chronic diastolic CHF, A FIB.Cleared with RN Isidoro Donning secondary to the PE.- reportedly cleared, medically stable to Eval and treat.   OT comments  Guided pt in AM ADL routine. Pt Supervision for all transfers and mobility using RW with no LOB and assistance only needed to manage leads safely. Pt able to tolerate standing between 8-10 minutes using RW for support to gather clothing items, complete UB ADLs and grooming tasks standing at sink on RA. Pt Supervision for toileting task and LB bathing/dressing, which was performed seated. During activities on RA, pt dropped to 88%, but able to quickly recover to 92% on RA with instruction in pursed lip breathing and rest breaks. O2 readings noted in high 70s/low 80s at times, but believe this inaccurate due to probe difficulties. HR peaked at 124bpm during session, but overall ranging 90s-100. Provided fall prevention handout/education due to planned discharge home, as well as reinforcement of energy conservation strategies to maximize independence/safety. Reinforced use of flutter valve and incentive spirometer (up to 1086mL) with pt return demonstrating without difficulty. At end of session, pt seated in chair with O2 at 95% on RA. Discharge plans remain appropriate.    Follow Up Recommendations  Home health OT;Supervision/Assistance - 24 hour    Equipment Recommendations  None recommended by OT    Recommendations for Other Services      Precautions / Restrictions Precautions Precautions: Fall Precaution Comments: Monitor SpO2 & HR Restrictions Weight Bearing Restrictions: No       Mobility Bed Mobility Overal bed mobility: Modified Independent Bed Mobility: Supine to Sit     Supine to sit: Modified  independent (Device/Increase time)        Transfers     Transfers: Sit to/from [facility] Sit to Stand: Supervision Stand pivot transfers: Supervision       General transfer comment: Noted 0 instances of LOB    Balance Overall balance assessment: No apparent balance deficits (not formally assessed)                                         ADL either performed or assessed with clinical judgement   ADL Overall ADL's : Needs assistance/impaired Eating/Feeding: Independent;Sitting   Grooming: Set up;Supervision/safety;Standing   Upper Body Bathing: Supervision/ safety;Set up;Standing   Lower Body Bathing: Supervison/ safety   Upper Body Dressing : Set up;Supervision/safety;Sitting Upper Body Dressing Details (indicate cue type and reason): Assistance to manage leads Lower Body Dressing: Min guard;Sit to/from stand   Toilet Transfer: Supervision/safety;Ambulation;Regular Toilet;Grab bars   Toileting- Clothing Manipulation and Hygiene: Supervision/safety;Sit to/from stand       Functional mobility during ADLs: Supervision/safety;Rolling walker General ADL Comments: Pt able to ambulate to/from bathroom with RW, noting 0 instances of LOB.     Vision       Perception     Praxis      Cognition Arousal/Alertness: Awake/alert Behavior During Therapy: WFL for tasks assessed/performed Overall Cognitive Status: Within Functional Limits for tasks assessed  Exercises Exercises: Other exercises Other Exercises Other Exercises: Pursed lip breathing reinforcement Other Exercises: flutter valve x 10 Other Exercises: incentive spirometer x 10 (1074mL)   Shoulder Instructions       General Comments      Pertinent Vitals/ Pain       Pain Assessment: No/denies pain Faces Pain Scale: No hurt  Home Living                                          Prior  Functioning/Environment              Frequency  Min 3X/week        Progress Toward Goals  OT Goals(current goals can now be found in the care plan section)  Progress towards OT goals: Progressing toward goals  Acute Rehab OT Goals Patient Stated Goal: to go home Time For Goal Achievement: 08/09/19 Potential to Achieve Goals: Good ADL Goals Pt Will Perform Grooming: with modified independence;standing Pt Will Perform Lower Body Bathing: with modified independence;sit to/from stand Pt Will Perform Lower Body Dressing: with modified independence;sit to/from stand Pt Will Transfer to Toilet: with modified independence;ambulating;regular height toilet Pt Will Perform Toileting - Clothing Manipulation and hygiene: with modified independence;sit to/from stand Additional ADL Goal #1: Pt to recall and verbalize 3 fall prevention strategies with 0 verbal cues. Additional ADL Goal #2: Pt to recall and verbalize 3 energy conservation strategies with 0 verbal cues. Additional ADL Goal #3: Pt to tolerate standing up to 10 min with modified independence, in preparation for ADLs.  Plan Discharge plan remains appropriate    Co-evaluation                 AM-PAC OT "6 Clicks" Daily Activity     Outcome Measure   Help from another person eating meals?: None Help from another person taking care of personal grooming?: A Little Help from another person toileting, which includes using toliet, bedpan, or urinal?: A Little Help from another person bathing (including washing, rinsing, drying)?: A Little Help from another person to put on and taking off regular upper body clothing?: A Little Help from another person to put on and taking off regular lower body clothing?: A Little 6 Click Score: 19    End of Session Equipment Utilized During Treatment: Rolling walker  OT Visit Diagnosis: Unsteadiness on feet (R26.81);Muscle weakness (generalized) (M62.81)   Activity Tolerance Patient  tolerated treatment well   Patient Left in chair;with call bell/phone within reach   Nurse Communication Mobility status;Other (comment)(completed setup of telemetry lines, vitals during tx)        Time: 0921-1030 OT Time Calculation (min): 69 min  Charges: OT General Charges $OT Visit: 1 Visit OT Treatments $Self Care/Home Management : 38-52 mins $Therapeutic Activity: 8-22 mins  Layla Maw, OTR/L   Layla Maw 07/30/2019, 10:51 AM

## 2019-07-30 NOTE — Care Management Important Message (Signed)
Important Message  Patient Details  Name: Miranda Moses MRN: [id number] Date of Birth: September 15, 1940   Medicare Important Message Given:  Yes - Important Message mailed due to current National Emergency  Verbal consent obtained due to current National Emergency  Relationship to patient: Child Contact Name: Newt Minion Call Date: 07/30/19  Time: 1117 Phone: [phone] Outcome: No Answer/Busy Important Message mailed to: Patient address on file    Delorse Lek 07/30/2019, 11:17 AM

## 2019-07-30 NOTE — Care Management Important Message (Signed)
Important Message  Patient Details  Name: Miranda Moses MRN: [id number] Date of Birth: [birth year]   Medicare Important Message Given:  Yes - Important Message mailed due to current National Emergency  Verbal consent obtained due to current National Emergency  Relationship to patient: Child Contact Name: Newt Minion Call Date: 07/30/19  Time: 1150 Phone: [phone] Outcome: Spoke with contact Important Message mailed to: Patient address on file    [place] 07/30/2019, 11:50 AM

## 2019-07-30 NOTE — Telephone Encounter (Signed)
This encounter was created in error - please disregard.

## 2019-07-30 NOTE — Progress Notes (Signed)
Patient wheeled to front of hospital for DC to care of family. No distress noted.

## 2019-07-30 NOTE — Progress Notes (Signed)
Spoke to [place], the patients daughter. She cannot drive and her husband is still at work. They will call when they are on the way to pick up the patient.

## 2019-07-30 NOTE — Discharge Summary (Signed)
Physician Discharge Summary  Miranda Moses [id number] DOB: September 22, 1940 DOA: 07/23/2019  PCP: [name], PA-C  Admit date: 07/23/2019 Discharge date: 07/30/2019  Admitted From: home Disposition:  home  Recommendations for Outpatient Follow-up:  1. Follow up with PCP in 1-2 weeks 2. Follow-up with cardiology in 1 to 2 weeks 3. Patient started on metoprolol instead of home bisoprolol, also started on digoxin  Home Health: PT Equipment/Devices: none   Discharge Condition: stable CODE STATUS: Full code Diet recommendation: heart healthy  HPI: Per admitting MD, Miranda Moses is a 79 y.o. female with medical history significant for hypertension, prediabetes, pulmonary arterial hypertension, chronic diastolic CHF, coronary artery disease, and COVID-19 diagnosed on 07/09/2019, now presenting to the emergency department for evaluation of fatigue, lightheadedness, and malaise.  Patient reports that she developed a cough just over 2 weeks ago, was tested for COVID-19 the following day (07/09/2019), was positive, and has gone on to have progressive malaise and fatigue.  She suspected that the lightheadedness was due to dehydration so she stopped taking her Lasix and increased her fluid intake.  She continued to feel poorly and went to the emergency department last night.  She has not had any chest pain or palpitations associated with this.  Her chronic bilateral leg swelling has worsened over the past few days.  She has not noted any leg tenderness though.  Denies hemoptysis.  Hospital Course / Discharge diagnoses: Acute pulmonary embolism -patient on admission showed to have an elevated D-dimer and CT angiogram showed extensive bilateral PEs with right heart strain.  She was treated with heparin infusion for 72 hours and now transition to Eliquis and appears to be stable and on room air.  Continue Eliquis, she is on 10 mg twice daily up until 2/8 when she will be transitioned to 5 twice daily. Acute  hypoxic respiratory failure due to PE and COVID-19 pneumonia-initially diagnosed on 1/15, CT angiogram showed hazy groundglass opacities, she has finished a course of remdesivir while hospitalized and currently she is on steroids which she needs for total of 10 days. Inflammatory markers improving New onset A. fib with RVR-she was initially requiring Cardizem drip, now transition to metoprolol twice daily.  Soft blood pressures have made rate control slightly difficult as AV nodal blocking agents cannot be uptitrated significantly.  Cardiology has been consulted, patient was started on digoxin which he is to continue along with metoprolol. She did have few episodes of heart rate into the 40s and metoprolol has been changed from 50 twice daily to 25 twice daily.  No significant pauses.  She was asked to follow-up with her primary cardiologist within the next couple of weeks Chronic diastolic CHF-remains euvolemic, continue home regimen, low sodium diet Coronary artery disease-no angina, she has been intolerant of statins, continue aspirin Hypertension -blood pressure within normal parameters on metoprolol, Lasix on hold as above.   Discharge Instructions  Allergies as of 07/30/2019      Reactions   Clarithromycin    Numbness of extremities   Codeine Other (See Comments)   Caused pain   Penicillins Rash   Did it involve swelling of the face/tongue/throat, SOB, or low BP? No Did it involve sudden or severe rash/hives, skin peeling, or any reaction on the inside of your mouth or nose? Yes Did you need to seek medical attention at a hospital or doctor's office? Yes When did it last happen?20 years If all above answers are "NO", may proceed with cephalosporin use.  Medication List    STOP taking these medications   bisoprolol 5 MG tablet Commonly known as: ZEBETA   pravastatin 20 MG tablet Commonly known as: PRAVACHOL     TAKE these medications   acetaminophen 325 MG  tablet Commonly known as: TYLENOL Take 650 mg by mouth every 6 (six) hours as needed for moderate pain or fever.   albuterol 108 (90 Base) MCG/ACT inhaler Commonly known as: VENTOLIN HFA Inhale 1-2 puffs into the lungs every 4 (four) hours as needed for shortness of breath.   alum & mag hydroxide-simeth 200-200-20 MG/5ML suspension Commonly known as: MAALOX/MYLANTA Take 15 mLs by mouth every 6 (six) hours as needed for indigestion or heartburn.   apixaban 5 MG Tabs tablet Commonly known as: ELIQUIS Take 2 tablets twice daily for 3 days then take 1 tablet twice daily thereafter   aspirin EC 81 MG tablet Take 81 mg by mouth daily.   atorvastatin 20 MG tablet Commonly known as: LIPITOR Take 20 mg by mouth daily.   benzonatate 200 MG capsule Commonly known as: TESSALON Take 200 mg by mouth 3 (three) times daily as needed for cough.   dexamethasone 6 MG tablet Commonly known as: DECADRON Take 1 tablet (6 mg total) by mouth daily. Start taking on: July 31, 2019   digoxin 0.125 MG tablet Commonly known as: LANOXIN Take 1 tablet (0.125 mg total) by mouth daily. Start taking on: July 31, 2019   EYLEA IO Inject 1 Dose into the eye every 30 (thirty) days.   furosemide 40 MG tablet Commonly known as: LASIX Take 1 tablet (40 mg total) by mouth daily as needed for edema.   ibuprofen 200 MG tablet Commonly known as: ADVIL Take 600 mg by mouth every 6 (six) hours as needed for fever or moderate pain.   isosorbide mononitrate 30 MG 24 hr tablet Commonly known as: IMDUR TAKE 1 TABLET(30 MG) BY MOUTH DAILY What changed: See the new instructions.   metoprolol tartrate 25 MG tablet Commonly known as: LOPRESSOR Take 1 tablet (25 mg total) by mouth 2 (two) times daily.   multivitamin with minerals Tabs tablet Take 1 tablet by mouth daily.   Nasacort Allergy 24HR 55 MCG/ACT Aero nasal inhaler Generic drug: triamcinolone Place 1 spray into the nose daily as needed  (allergies).   omeprazole 20 MG capsule Commonly known as: PRILOSEC Take 20 mg by mouth daily.   tobramycin 0.3 % ophthalmic solution Commonly known as: TOBREX Place 1 drop into the right eye See admin instructions. Instill 1 drop into the right eye 4 times daily the day before, the day of, and the day after monthly eye injections   triamcinolone cream 0.5 % Commonly known as: KENALOG Apply 1 application topically daily as needed (irritation).   VITAMIN D3 PO Take 2 capsules by mouth daily.   ZINC-VITAMIN C PO Take 2 tablets by mouth daily.       Consultations:  Cardiology   Procedures/Studies:  2D echo  IMPRESSIONS    1. Left ventricular ejection fraction, by visual estimation, is 55 to  60%.  2. Global right ventricle has moderately reduced systolic function.The  right ventricular size is moderately enlarged. no increase in right  ventricular wall thickness.  3. The RV is moderately dilated with moderately reduced RV function.  4. Right atrial size was severely dilated.  5. The mitral valve is grossly normal. Mild mitral valve regurgitation.  6. The tricuspid valve was normal in structure. Tricuspid valve  regurgitation  is mild.  7. Tricuspid valve regurgitation is mild.  8. The pulmonic valve was normal in structure. Pulmonic valve  regurgitation is mild by color flow Doppler.  9. Mildly elevated pulmonary artery systolic pressure.   DG Chest Port 1 View  Result Date: 07/24/2019 CLINICAL DATA:  Dyspnea.  COVID-19. EXAM: PORTABLE CHEST 1 VIEW COMPARISON:  07/22/2019 from [facility]. FINDINGS: Patient rotated minimally right. Mild cardiomegaly. Atherosclerosis in the transverse aorta. No pleural effusion or pneumothorax. No lobar consolidation. Pulmonary interstitial thickening, felt to be accentuated by AP portable technique and diminished lung volumes. Areas of mild left infrahilar scarring are felt to be similar. IMPRESSION: Cardiomegaly with  diminished lung volumes, chronic interstitial thickening. No convincing evidence of superimposed pneumonia. Aortic Atherosclerosis (ICD10-I70.0). Electronically Signed   By: Abigail Miyamoto M.D.   On: 07/24/2019 16:03   ECHOCARDIOGRAM LIMITED  Result Date: 07/23/2019   ECHOCARDIOGRAM LIMITED REPORT   Patient Name:   Miranda Moses Date of Exam: 07/23/2019 Medical Rec #:  [id number]  Height:       65.0 in Accession #:    [id number] Weight:       249.2 lb Date of Birth:  10/07/1940  BSA:          2.17 m Patient Age:    79 years   BP:           115/91 mmHg Patient Gender: F          HR:           103 bpm. Exam Location:  Inpatient  Procedure: Limited Echo, Cardiac Doppler and Limited Color Doppler Indications:    I26.02 Pulmonary embolus  History:        Patient has prior history of Echocardiogram examinations, most                 recent 10/14/2018.  Sonographer:    Jonelle Sidle Dance Referring Phys: [id number] [place]  1. Left ventricular ejection fraction, by visual estimation, is 55 to 60%.  2. Global right ventricle has moderately reduced systolic function.The right ventricular size is moderately enlarged. no increase in right ventricular wall thickness.  3. The RV is moderately dilated with moderately reduced RV function.  4. Right atrial size was severely dilated.  5. The mitral valve is grossly normal. Mild mitral valve regurgitation.  6. The tricuspid valve was normal in structure. Tricuspid valve regurgitation is mild.  7. Tricuspid valve regurgitation is mild.  8. The pulmonic valve was normal in structure. Pulmonic valve regurgitation is mild by color flow Doppler.  9. Mildly elevated pulmonary artery systolic pressure. FINDINGS  Left Ventricle: Left ventricular ejection fraction, by visual estimation, is 55 to 60%. Right Ventricle: The right ventricular size is moderately enlarged. No increase in right ventricular wall thickness. Global RV systolic function is has moderately reduced systolic function.  The tricuspid regurgitant velocity is 2.33 m/s, and with an assumed right atrial pressure of 15 mmHg, the estimated right ventricular systolic pressure is mildly elevated at 36.8 mmHg. The RV is moderately dilated with moderately reduced RV function. Right Atrium: Right atrial size was severely dilated. Right atrial pressure is estimated at 15 mmHg. Mitral Valve: The mitral valve is grossly normal. MV Area by PHT, 3.77 cm. MV PHT, 58.29 msec. Mild mitral valve regurgitation. Tricuspid Valve: The tricuspid valve is normal in structure. Tricuspid valve regurgitation is mild. Pulmonic Valve: The pulmonic valve was normal in structure. Pulmonic valve regurgitation is mild by color flow Doppler. Pulmonic  regurgitation is mild by color flow Doppler. Aorta: The aortic root and ascending aorta are structurally normal, with no evidence of dilitation.  LEFT VENTRICLE          Normals PLAX 2D LVIDd:         3.69 cm  3.6 cm LVIDs:         2.81 cm  1.7 cm LV PW:         1.37 cm  1.4 cm LV IVS:        1.02 cm  1.3 cm LVOT diam:     1.90 cm  2.0 cm LV SV:         28 ml    79 ml LV SV Index:   11.96    45 ml/m2 LVOT Area:     2.84 cm 3.14 cm2  RIGHT VENTRICLE          IVC RV Basal diam:  3.55 cm  IVC diam: 2.57 cm RV Mid diam:    3.17 cm TAPSE (M-mode): 1.2 cm LEFT ATRIUM             Index       RIGHT ATRIUM           Index LA diam:        4.30 cm 1.98 cm/m  RA Area:     30.90 cm LA Vol (A2C):   67.1 ml 30.89 ml/m RA Volume:   111.00 ml 51.11 ml/m LA Vol (A4C):   68.8 ml 31.68 ml/m LA Biplane Vol: 68.0 ml 31.31 ml/m  AORTIC VALVE             Normals LVOT Vmax:   81.10 cm/s LVOT Vmean:  51.400 cm/s 75 cm/s LVOT VTI:    0.140 m     25.3 cm  AORTA                 Normals Ao Root diam: 2.90 cm 31 mm Ao Asc diam:  2.30 cm 31 mm MITRAL VALVE              Normals   TRICUSPID VALVE             Normals MV Area (PHT): 3.77 cm             TR Peak grad:   21.8 mmHg MV PHT:        58.29 msec 55 ms     TR Vmax:        261.00 cm/s 288  cm/s MV Decel Time: 201 msec   187 ms MV E velocity: 123.00 cm/s 103 cm/s SHUNTS                                     Systemic VTI:  0.14 m                                     Systemic Diam: 1.90 cm  Mertie Moores MD Electronically signed by Mertie Moores MD Signature Date/Time: 07/23/2019/5:33:58 PMThe mitral valve is grossly normal.    Final     Subjective: - no chest pain, shortness of breath, no abdominal pain, nausea or vomiting.   Discharge Exam: BP 105/68 (BP Location: Right Arm)   Pulse 82   Temp 98.5 F (36.9 C) (Oral)   Resp 19   Ht  5\' 5"  (1.651 m)   Wt 113 kg   SpO2 96%   BMI 41.47 kg/m   General: Pt is alert, awake, not in acute distress Cardiovascular: irregular, no edema Respiratory: CTA bilaterally, no wheezing, no rhonchi Abdominal: Soft, NT, ND, bowel sounds + Extremities: no edema, no cyanosis    The results of significant diagnostics from this hospitalization (including imaging, microbiology, ancillary and laboratory) are listed below for reference.     Microbiology: No results found for this or any previous visit (from the past 240 hour(s)).   Labs: Basic Metabolic Panel: Recent Labs  Lab 07/24/19 0302 07/24/19 0302 07/25/19 0221 07/26/19 0353 07/27/19 0345 07/28/19 0252 07/29/19 0341  NA 138   < > 135 136 136 135 133*  K 3.8   < > 4.3 4.9 4.3 4.7 4.9  CL 105   < > 105 105 101 100 95*  CO2 23   < > 22 23 27 27 28   GLUCOSE 99   < > 100* 160* 171* 193* 201*  BUN 26*   < > 24* 25* 32* 36* 46*  CREATININE 0.86   < > 0.85 0.79 0.85 0.95 1.04*  CALCIUM 8.3*   < > 8.3* 9.1 8.9 8.8* 8.7*  MG 1.9  --  2.0 2.0 1.9  --   --    < > = values in this interval not displayed.   Liver Function Tests: Recent Labs  Lab 07/25/19 0221 07/26/19 0353 07/27/19 0345 07/28/19 0252 07/29/19 0341  AST 20 18 15 17 17   ALT 19 17 18 17 20   ALKPHOS 57 64 62 67 58  BILITOT 0.8 0.8 0.6 0.5 0.8  PROT 6.0* 6.5 6.5 6.1* 5.9*  ALBUMIN 2.7* 2.9* 2.7* 2.5* 2.8*    CBC: Recent Labs  Lab 07/24/19 0302 07/25/19 0221 07/26/19 0353 07/28/19 0252  WBC 6.0 7.2 8.2 10.1  NEUTROABS 4.6 5.3 7.1  --   HGB 11.4* 11.4* 12.9 13.1  HCT 35.7* 36.0 40.4 40.2  MCV 86.7 86.7 86.5 84.1  PLT 167 196 247 367   CBG: No results for input(s): GLUCAP in the last 168 hours. Hgb A1c No results for input(s): HGBA1C in the last 72 hours. Lipid Profile No results for input(s): CHOL, HDL, LDLCALC, TRIG, CHOLHDL, LDLDIRECT in the last 72 hours. Thyroid function studies No results for input(s): TSH, T4TOTAL, T3FREE, THYROIDAB in the last 72 hours.  Invalid input(s): FREET3 Urinalysis No results found for: COLORURINE, APPEARANCEUR, LABSPEC, PHURINE, GLUCOSEU, HGBUR, BILIRUBINUR, KETONESUR, PROTEINUR, UROBILINOGEN, NITRITE, LEUKOCYTESUR  FURTHER DISCHARGE INSTRUCTIONS:   Get Medicines reviewed and adjusted: Please take all your medications with you for your next visit with your Primary MD   Laboratory/radiological data: Please request your Primary MD to go over all hospital tests and procedure/radiological results at the follow up, please ask your Primary MD to get all Hospital records sent to his/her office.   In some cases, they will be blood work, cultures and biopsy results pending at the time of your discharge. Please request that your primary care M.D. goes through all the records of your hospital data and follows up on these results.   Also Note the following: If you experience worsening of your admission symptoms, develop shortness of breath, life threatening emergency, suicidal or homicidal thoughts you must seek medical attention immediately by calling 911 or calling your MD immediately  if symptoms less severe.   You must read complete instructions/literature along with all the possible adverse reactions/side effects for all the  Medicines you take and that have been prescribed to you. Take any new Medicines after you have completely understood and accpet  all the possible adverse reactions/side effects.    Do not drive when taking Pain medications or sleeping medications (Benzodaizepines)   Do not take more than prescribed Pain, Sleep and Anxiety Medications. It is not advisable to combine anxiety,sleep and pain medications without talking with your primary care practitioner   Special Instructions: If you have smoked or chewed Tobacco  in the last 2 yrs please stop smoking, stop any regular Alcohol  and or any Recreational drug use.   Wear Seat belts while driving.   Please note: You were cared for by a hospitalist during your hospital stay. Once you are discharged, your primary care physician will handle any further medical issues. Please note that NO REFILLS for any discharge medications will be authorized once you are discharged, as it is imperative that you return to your primary care physician (or establish a relationship with a primary care physician if you do not have one) for your post hospital discharge needs so that they can reassess your need for medications and monitor your lab values.  Time coordinating discharge: 40 minutes  SIGNED:  Marzetta Board, MD, PhD 07/30/2019, 10:21 AM

## 2019-07-31 DIAGNOSIS — R5381 Other malaise: Secondary | ICD-10-CM | POA: Diagnosis not present

## 2019-07-31 DIAGNOSIS — Z7982 Long term (current) use of aspirin: Secondary | ICD-10-CM | POA: Diagnosis not present

## 2019-07-31 DIAGNOSIS — I2721 Secondary pulmonary arterial hypertension: Secondary | ICD-10-CM | POA: Diagnosis not present

## 2019-07-31 DIAGNOSIS — K219 Gastro-esophageal reflux disease without esophagitis: Secondary | ICD-10-CM | POA: Diagnosis not present

## 2019-07-31 DIAGNOSIS — Z7952 Long term (current) use of systemic steroids: Secondary | ICD-10-CM | POA: Diagnosis not present

## 2019-07-31 DIAGNOSIS — I2510 Atherosclerotic heart disease of native coronary artery without angina pectoris: Secondary | ICD-10-CM | POA: Diagnosis not present

## 2019-07-31 DIAGNOSIS — I739 Peripheral vascular disease, unspecified: Secondary | ICD-10-CM | POA: Diagnosis not present

## 2019-07-31 DIAGNOSIS — I4891 Unspecified atrial fibrillation: Secondary | ICD-10-CM | POA: Diagnosis not present

## 2019-07-31 DIAGNOSIS — Z9181 History of falling: Secondary | ICD-10-CM | POA: Diagnosis not present

## 2019-07-31 DIAGNOSIS — J9601 Acute respiratory failure with hypoxia: Secondary | ICD-10-CM | POA: Diagnosis not present

## 2019-07-31 DIAGNOSIS — J1282 Pneumonia due to coronavirus disease 2019: Secondary | ICD-10-CM | POA: Diagnosis not present

## 2019-07-31 DIAGNOSIS — I5032 Chronic diastolic (congestive) heart failure: Secondary | ICD-10-CM | POA: Diagnosis not present

## 2019-07-31 DIAGNOSIS — U071 COVID-19: Secondary | ICD-10-CM | POA: Diagnosis not present

## 2019-07-31 DIAGNOSIS — Z7901 Long term (current) use of anticoagulants: Secondary | ICD-10-CM | POA: Diagnosis not present

## 2019-07-31 DIAGNOSIS — I2699 Other pulmonary embolism without acute cor pulmonale: Secondary | ICD-10-CM | POA: Diagnosis not present

## 2019-07-31 DIAGNOSIS — E782 Mixed hyperlipidemia: Secondary | ICD-10-CM | POA: Diagnosis not present

## 2019-07-31 DIAGNOSIS — I110 Hypertensive heart disease with heart failure: Secondary | ICD-10-CM | POA: Diagnosis not present

## 2019-08-01 DIAGNOSIS — Z7952 Long term (current) use of systemic steroids: Secondary | ICD-10-CM

## 2019-08-01 DIAGNOSIS — I2721 Secondary pulmonary arterial hypertension: Secondary | ICD-10-CM | POA: Diagnosis not present

## 2019-08-01 DIAGNOSIS — I110 Hypertensive heart disease with heart failure: Secondary | ICD-10-CM | POA: Diagnosis not present

## 2019-08-01 DIAGNOSIS — E782 Mixed hyperlipidemia: Secondary | ICD-10-CM | POA: Diagnosis not present

## 2019-08-01 DIAGNOSIS — I2510 Atherosclerotic heart disease of native coronary artery without angina pectoris: Secondary | ICD-10-CM | POA: Diagnosis not present

## 2019-08-01 DIAGNOSIS — K219 Gastro-esophageal reflux disease without esophagitis: Secondary | ICD-10-CM | POA: Diagnosis not present

## 2019-08-01 DIAGNOSIS — R5381 Other malaise: Secondary | ICD-10-CM | POA: Diagnosis not present

## 2019-08-01 DIAGNOSIS — I5032 Chronic diastolic (congestive) heart failure: Secondary | ICD-10-CM | POA: Diagnosis not present

## 2019-08-01 DIAGNOSIS — Z9181 History of falling: Secondary | ICD-10-CM

## 2019-08-01 DIAGNOSIS — Z7982 Long term (current) use of aspirin: Secondary | ICD-10-CM

## 2019-08-01 DIAGNOSIS — I739 Peripheral vascular disease, unspecified: Secondary | ICD-10-CM | POA: Diagnosis not present

## 2019-08-01 DIAGNOSIS — Z7901 Long term (current) use of anticoagulants: Secondary | ICD-10-CM

## 2019-08-01 DIAGNOSIS — J9601 Acute respiratory failure with hypoxia: Secondary | ICD-10-CM | POA: Diagnosis not present

## 2019-08-01 DIAGNOSIS — I4891 Unspecified atrial fibrillation: Secondary | ICD-10-CM | POA: Diagnosis not present

## 2019-08-01 DIAGNOSIS — J1282 Pneumonia due to coronavirus disease 2019: Secondary | ICD-10-CM | POA: Diagnosis not present

## 2019-08-01 DIAGNOSIS — U071 COVID-19: Secondary | ICD-10-CM | POA: Diagnosis not present

## 2019-08-04 DIAGNOSIS — I2699 Other pulmonary embolism without acute cor pulmonale: Secondary | ICD-10-CM | POA: Diagnosis not present

## 2019-08-04 DIAGNOSIS — J1282 Pneumonia due to coronavirus disease 2019: Secondary | ICD-10-CM | POA: Diagnosis not present

## 2019-08-04 DIAGNOSIS — J9601 Acute respiratory failure with hypoxia: Secondary | ICD-10-CM | POA: Diagnosis not present

## 2019-08-04 DIAGNOSIS — I2510 Atherosclerotic heart disease of native coronary artery without angina pectoris: Secondary | ICD-10-CM | POA: Diagnosis not present

## 2019-08-04 DIAGNOSIS — R5381 Other malaise: Secondary | ICD-10-CM | POA: Diagnosis not present

## 2019-08-04 DIAGNOSIS — U071 COVID-19: Secondary | ICD-10-CM | POA: Diagnosis not present

## 2019-08-06 DIAGNOSIS — R5381 Other malaise: Secondary | ICD-10-CM | POA: Diagnosis not present

## 2019-08-06 DIAGNOSIS — I2699 Other pulmonary embolism without acute cor pulmonale: Secondary | ICD-10-CM | POA: Diagnosis not present

## 2019-08-06 DIAGNOSIS — J1282 Pneumonia due to coronavirus disease 2019: Secondary | ICD-10-CM | POA: Diagnosis not present

## 2019-08-06 DIAGNOSIS — U071 COVID-19: Secondary | ICD-10-CM | POA: Diagnosis not present

## 2019-08-06 DIAGNOSIS — I2510 Atherosclerotic heart disease of native coronary artery without angina pectoris: Secondary | ICD-10-CM | POA: Diagnosis not present

## 2019-08-06 DIAGNOSIS — J9601 Acute respiratory failure with hypoxia: Secondary | ICD-10-CM | POA: Diagnosis not present

## 2019-08-09 ENCOUNTER — Ambulatory Visit (INDEPENDENT_AMBULATORY_CARE_PROVIDER_SITE_OTHER): Payer: Medicare Other | Admitting: Physician Assistant

## 2019-08-09 ENCOUNTER — Encounter: Payer: Self-pay | Admitting: Physician Assistant

## 2019-08-09 VITALS — BP 126/64 | HR 61 | Temp 96.4°F | Resp 16 | Ht 65.0 in | Wt 230.0 lb

## 2019-08-09 DIAGNOSIS — I5032 Chronic diastolic (congestive) heart failure: Secondary | ICD-10-CM

## 2019-08-09 DIAGNOSIS — I4891 Unspecified atrial fibrillation: Secondary | ICD-10-CM | POA: Diagnosis not present

## 2019-08-09 DIAGNOSIS — E559 Vitamin D deficiency, unspecified: Secondary | ICD-10-CM

## 2019-08-09 DIAGNOSIS — I10 Essential (primary) hypertension: Secondary | ICD-10-CM | POA: Diagnosis not present

## 2019-08-09 DIAGNOSIS — E782 Mixed hyperlipidemia: Secondary | ICD-10-CM | POA: Diagnosis not present

## 2019-08-09 DIAGNOSIS — I110 Hypertensive heart disease with heart failure: Secondary | ICD-10-CM | POA: Diagnosis not present

## 2019-08-09 DIAGNOSIS — I2609 Other pulmonary embolism with acute cor pulmonale: Secondary | ICD-10-CM

## 2019-08-09 DIAGNOSIS — I25118 Atherosclerotic heart disease of native coronary artery with other forms of angina pectoris: Secondary | ICD-10-CM

## 2019-08-09 HISTORY — DX: Vitamin D deficiency, unspecified: E55.9

## 2019-08-09 HISTORY — DX: Atherosclerotic heart disease of native coronary artery with other forms of angina pectoris: I25.118

## 2019-08-09 HISTORY — DX: Chronic diastolic (congestive) heart failure: I50.32

## 2019-08-09 NOTE — Assessment & Plan Note (Signed)
Lipid panel pending - continue current meds as directed

## 2019-08-09 NOTE — Progress Notes (Signed)
Established Patient Office Visit  Subjective:  Patient ID: Miranda Moses, female    DOB: 05-24-1941  Age: 79 y.o. MRN: [id number]  CC:  Chief Complaint  Patient presents with  . Hospitalization Follow-up    HPI Miranda Moses presents for hospital follow up from COVID/complications  Pt was admitted to hospital for [place] complications from [id number]- 07/30/19 She was diagnosed with Acute PE and was started on Eliquis - her dose is now 5mg  1 po bid Her breathing is good at this time and was not discharged home on oxygen  While patient was in hospital she was also diagnosed with new onset atrial fibrillation - She has been started on Digoxin 0.125mg  qd -She was also advised to stop her bisoprolol and started on metoprolol 25mg  bid -  she was supposed to have cardiology follow up but was discharged without that appt being made - She is a patient of Dr [name]  Mixed hyperlipidemia  Pt presents with hyperlipidemia.  Compliance with treatment has been good; The patient is compliant with medications, maintains a low cholesterol diet , follows up as directed , and maintains an exercise regimen . The patient denies experiencing any hypercholesterolemia related symptoms.  Evidenced based information based on history , exam, and other sources has been used  for decision making. Pt currently on atorvastatin 20mg  qd  Pt presents for follow up of hypertension.  The patient is tolerating the medication well without side effects. Compliance with treatment has been good; including taking medication as directed , maintains a healthy diet and regular exercise regimen , and following up as directed. Patient was evaluated using exam, physical, labs and other information to perform evidence based decision making. Pt currently on metoprolol 25mg  bid  Pt has a history of chronic diastolic CHF - she is trying to do a low sodium diet and currently on lasix 40mg  daily as needed  Past Medical History:  Diagnosis Date  . CAD  in native artery    by CT coronary 10/2018 >> medical therapy   . Chronic diastolic (congestive) heart failure ([place])   . GERD (gastroesophageal reflux disease)   . Hyperlipidemia   . Hypertension   . Pre-diabetes   . Pulmonary hypertension ([place])     Past Surgical History:  Procedure Laterality Date  . ABDOMINAL HYSTERECTOMY    . CATARACT EXTRACTION Left 03/2014  . CATARACT EXTRACTION Right 05/2014  . COLONOSCOPY  11/05/2013    Family History  Problem Relation Age of Onset  . Heart disease Father     Social History   Socioeconomic History  . Marital status: Widowed    Spouse name: Not on file  . Number of children: 1  . Years of education: Not on file  . Highest education level: Not on file  Occupational History  . Occupation: retired  Tobacco Use  . Smoking status: Never Smoker  . Smokeless tobacco: Never Used  Substance and Sexual Activity  . Alcohol use: Never  . Drug use: Never  . Sexual activity: Not on file  Other Topics Concern  . Not on file  Social History Narrative  . Not on file   Social Determinants of Health   Financial Resource Strain:   . Difficulty of Paying Living Expenses: Not on file  Food Insecurity:   . Worried About Charity fundraiser in the Last Year: Not on file  . Ran Out of Food in the Last Year: Not on file  Transportation Needs:   .  Lack of Transportation (Medical): Not on file  . Lack of Transportation (Non-Medical): Not on file  Physical Activity:   . Days of Exercise per Week: Not on file  . Minutes of Exercise per Session: Not on file  Stress:   . Feeling of Stress : Not on file  Social Connections:   . Frequency of Communication with Friends and Family: Not on file  . Frequency of Social Gatherings with Friends and Family: Not on file  . Attends Religious Services: Not on file  . Active Member of Clubs or Organizations: Not on file  . Attends Archivist Meetings: Not on file  . Marital Status: Not on file   Intimate Partner Violence:   . Fear of Current or Ex-Partner: Not on file  . Emotionally Abused: Not on file  . Physically Abused: Not on file  . Sexually Abused: Not on file     Current Outpatient Medications:  .  albuterol (VENTOLIN HFA) 108 (90 Base) MCG/ACT inhaler, Inhale into the lungs every 6 (six) hours as needed., Disp: , Rfl:  .  apixaban (ELIQUIS) 5 MG TABS tablet, Take 2 tablets twice daily for 3 days then take 1 tablet twice daily thereafter, Disp: 66 tablet, Rfl: 0 .  aspirin EC 81 MG tablet, Take 81 mg by mouth daily., Disp: , Rfl:  .  atorvastatin (LIPITOR) 20 MG tablet, Take 20 mg by mouth daily., Disp: , Rfl:  .  Cholecalciferol (VITAMIN D3 PO), Take 2 capsules by mouth daily., Disp: , Rfl:  .  digoxin (LANOXIN) 0.125 MG tablet, Take 1 tablet (0.125 mg total) by mouth daily., Disp: 30 tablet, Rfl: 0 .  furosemide (LASIX) 40 MG tablet, Take 1 tablet (40 mg total) by mouth daily as needed for edema., Disp: 30 tablet, Rfl: 0 .  isosorbide mononitrate (IMDUR) 30 MG 24 hr tablet, TAKE 1 TABLET(30 MG) BY MOUTH DAILY (Patient taking differently: Take 30 mg by mouth daily. ), Disp: 30 tablet, Rfl: 3 .  metoprolol tartrate (LOPRESSOR) 25 MG tablet, Take 1 tablet (25 mg total) by mouth 2 (two) times daily., Disp: 60 tablet, Rfl: 1 .  Multiple Vitamin (MULTIVITAMIN WITH MINERALS) TABS tablet, Take 1 tablet by mouth daily., Disp: , Rfl:  .  omeprazole (PRILOSEC) 20 MG capsule, Take 20 mg by mouth daily. , Disp: , Rfl:  .  triamcinolone (NASACORT ALLERGY 24HR) 55 MCG/ACT AERO nasal inhaler, Place 1 spray into the nose daily as needed (allergies)., Disp: , Rfl:  .  ZINC-VITAMIN C PO, Take 2 tablets by mouth daily., Disp: , Rfl:    Allergies  Allergen Reactions  . Clarithromycin     Numbness of extremities  . Codeine Other (See Comments)    Caused pain  . Penicillins Rash    Did it involve swelling of the face/tongue/throat, SOB, or low BP? No Did it involve sudden or severe  rash/hives, skin peeling, or any reaction on the inside of your mouth or nose? Yes Did you need to seek medical attention at a hospital or doctor's office? Yes When did it last happen?20 years If all above answers are "NO", may proceed with cephalosporin use.     ROS CONSTITUTIONAL: Negative for chills, fatigue, fever, unintentional weight gain and unintentional weight loss.  E/N/T: Negative for ear pain, nasal congestion and sore throat.  CARDIOVASCULAR: Negative for chest pain, dizziness, palpitations and mild edema of extremities  RESPIRATORY: Negative for recent cough and dyspnea.  GASTROINTESTINAL: Negative for abdominal pain, acid  reflux symptoms, constipation, diarrhea, nausea and vomiting.  MSK: Negative for arthralgias and myalgias.  INTEGUMENTARY: Negative for rash.  NEUROLOGICAL: Negative for dizziness and headaches.  PSYCHIATRIC: Negative for sleep disturbance and to question depression screen.  Negative for depression, negative for anhedonia.        Objective:    PHYSICAL EXAM:   VS: BP 126/64   Pulse 61   Temp (!) 96.4 F (35.8 C)   Resp 16   Ht 5\' 5"  (1.651 m)   Wt 230 lb (104.3 kg)   SpO2 97%   BMI 38.27 kg/m   GEN: Well nourished, well developed, in no acute distress  HEENT: normal external ears and nose - normal external auditory canals and TMS - hearing grossly normal - normal nasal mucosa and septum - Lips, Teeth and Gums - normal  Oropharynx - normal mucosa, palate, and posterior pharynx Neck: no JVD or masses - no thyromegaly Cardiac: RRR; no murmurs, rubs, or gallops,no edema - no significant varicosities Respiratory:  normal respiratory rate and pattern with no distress - normal breath sounds with no rales, rhonchi, wheezes or rubs GI: normal bowel sounds, no masses or tenderness MS: no deformity or atrophy  Skin: warm and dry, no rash  Neuro:  Alert and Oriented x 3, Strength and sensation are intact - CN II-Xii grossly intact Psych:  euthymic mood, appropriate affect and demeanor      Health Maintenance Due  Topic Date Due  . TETANUS/TDAP  09/18/1961  . DEXA SCAN  09/19/2007  . INFLUENZA VACCINE  01/23/2019    There are no preventive care reminders to display for this patient.  Lab Results  Component Value Date   TSH 0.819 07/23/2019   Lab Results  Component Value Date   WBC 10.1 07/28/2019   HGB 13.1 07/28/2019   HCT 40.2 07/28/2019   MCV 84.1 07/28/2019   PLT 367 07/28/2019   Lab Results  Component Value Date   NA 133 (L) 07/29/2019   K 4.9 07/29/2019   CO2 28 07/29/2019   GLUCOSE 201 (H) 07/29/2019   BUN 46 (H) 07/29/2019   CREATININE 1.04 (H) 07/29/2019   BILITOT 0.8 07/29/2019   ALKPHOS 58 07/29/2019   AST 17 07/29/2019   ALT 20 07/29/2019   PROT 5.9 (L) 07/29/2019   ALBUMIN 2.8 (L) 07/29/2019   CALCIUM 8.7 (L) 07/29/2019   ANIONGAP 10 07/29/2019   Lab Results  Component Value Date   CHOL 216 (H) 04/22/2019   Lab Results  Component Value Date   HDL 61 04/22/2019   Lab Results  Component Value Date   LDLCALC 135 (H) 04/22/2019   Lab Results  Component Value Date   TRIG 115 04/22/2019   Lab Results  Component Value Date   CHOLHDL 3.5 04/22/2019   No results found for: HGBA1C    Assessment & Plan:   Problem List Items Addressed This Visit      Cardiovascular and Mediastinum   Hypertensive heart disease with heart failure ([place])    Continue current meds and follow up with cardiology as directed (appt made with cardiologist 08/23/19      New onset atrial fibrillation ([place])    Continue current meds and follow up with Dr [name] as scheduled      Pulmonary embolus ([place]) - Primary    Continue Eliquis 5mg  bid      CHF (congestive heart failure), NYHA class I, chronic, diastolic ([place])   Coronary artery disease of native artery of  native heart with stable angina pectoris ([place])    Continue current meds and follow up with cardiology as directed      RESOLVED: Benign  hypertension   Relevant Orders   CBC with Differential/Platelet   Comprehensive metabolic panel     Other   Mixed hyperlipidemia    Lipid panel pending - continue current meds as directed      Relevant Orders   Lipid panel   Vitamin D deficiency    Vit D level pending      Relevant Orders   VITAMIN D 25 Hydroxy (Vit-D Deficiency, Fractures)      No orders of the defined types were placed in this encounter.   Follow-up: Return in about 3 months (around 11/06/2019) for chronic fasting follow up.    SARA R , PA-C

## 2019-08-09 NOTE — Assessment & Plan Note (Signed)
Continue current meds and follow up with Dr [name] as scheduled

## 2019-08-09 NOTE — Assessment & Plan Note (Addendum)
Continue current meds and follow up with cardiology as directed (appt made with cardiologist 08/23/19

## 2019-08-09 NOTE — Assessment & Plan Note (Signed)
Continue Eliquis 5 mg b.i.d..

## 2019-08-09 NOTE — Assessment & Plan Note (Signed)
Continue current meds and follow up with cardiology as directed

## 2019-08-09 NOTE — Assessment & Plan Note (Signed)
Vit D level pending 

## 2019-08-10 LAB — LIPID PANEL
Chol/HDL Ratio: 3.5 ratio (ref 0.0–4.4)
Cholesterol, Total: 155 mg/dL (ref 100–199)
HDL: 44 mg/dL (ref >39)
LDL Chol Calc (NIH): 90 mg/dL (ref 0–99)
Triglycerides: 116 mg/dL (ref 0–149)
VLDL Cholesterol Cal: 21 mg/dL (ref 5–40)

## 2019-08-10 LAB — CARDIOVASCULAR RISK ASSESSMENT

## 2019-08-10 LAB — COMPREHENSIVE METABOLIC PANEL
ALT: 23 IU/L (ref 0–32)
AST: 20 IU/L (ref 0–40)
Albumin/Globulin Ratio: 1.3 (ref 1.2–2.2)
Albumin: 3.2 g/dL — ABNORMAL LOW (ref 3.7–4.7)
Alkaline Phosphatase: 89 IU/L (ref 39–117)
BUN/Creatinine Ratio: 23 (ref 12–28)
BUN: 19 mg/dL (ref 8–27)
Bilirubin Total: 0.8 mg/dL (ref 0.0–1.2)
CO2: 22 mmol/L (ref 20–29)
Calcium: 8.9 mg/dL (ref 8.7–10.3)
Chloride: 103 mmol/L (ref 96–106)
Creatinine, Ser: 0.82 mg/dL (ref 0.57–1.00)
GFR calc Af Amer: 80 mL/min/{1.73_m2} (ref >59)
GFR calc non Af Amer: 70 mL/min/{1.73_m2} (ref >59)
Globulin, Total: 2.5 g/dL (ref 1.5–4.5)
Glucose: 125 mg/dL — ABNORMAL HIGH (ref 65–99)
Potassium: 4 mmol/L (ref 3.5–5.2)
Sodium: 141 mmol/L (ref 134–144)
Total Protein: 5.7 g/dL — ABNORMAL LOW (ref 6.0–8.5)

## 2019-08-10 LAB — CBC WITH DIFFERENTIAL/PLATELET
Basophils Absolute: 0 10*3/uL (ref 0.0–0.2)
Basos: 0 %
EOS (ABSOLUTE): 0.2 10*3/uL (ref 0.0–0.4)
Eos: 3 %
Hematocrit: 37.1 % (ref 34.0–46.6)
Hemoglobin: 12.1 g/dL (ref 11.1–15.9)
Immature Grans (Abs): 0.1 10*3/uL (ref 0.0–0.1)
Immature Granulocytes: 1 %
Lymphocytes Absolute: 1.1 10*3/uL (ref 0.7–3.1)
Lymphs: 13 %
MCH: 28.3 pg (ref 26.6–33.0)
MCHC: 32.6 g/dL (ref 31.5–35.7)
MCV: 87 fL (ref 79–97)
Monocytes Absolute: 0.8 10*3/uL (ref 0.1–0.9)
Monocytes: 10 %
Neutrophils Absolute: 5.9 10*3/uL (ref 1.4–7.0)
Neutrophils: 73 %
Platelets: 166 10*3/uL (ref 150–450)
RBC: 4.28 x10E6/uL (ref 3.77–5.28)
RDW: 14.6 % (ref 11.7–15.4)
WBC: 8.1 10*3/uL (ref 3.4–10.8)

## 2019-08-10 LAB — VITAMIN D 25 HYDROXY (VIT D DEFICIENCY, FRACTURES): Vit D, 25-Hydroxy: 38.3 ng/mL (ref 30.0–100.0)

## 2019-08-11 DIAGNOSIS — I2510 Atherosclerotic heart disease of native coronary artery without angina pectoris: Secondary | ICD-10-CM | POA: Diagnosis not present

## 2019-08-11 DIAGNOSIS — J1282 Pneumonia due to coronavirus disease 2019: Secondary | ICD-10-CM | POA: Diagnosis not present

## 2019-08-11 DIAGNOSIS — J9601 Acute respiratory failure with hypoxia: Secondary | ICD-10-CM | POA: Diagnosis not present

## 2019-08-11 DIAGNOSIS — R5381 Other malaise: Secondary | ICD-10-CM | POA: Diagnosis not present

## 2019-08-11 DIAGNOSIS — I2699 Other pulmonary embolism without acute cor pulmonale: Secondary | ICD-10-CM | POA: Diagnosis not present

## 2019-08-11 DIAGNOSIS — U071 COVID-19: Secondary | ICD-10-CM | POA: Diagnosis not present

## 2019-08-13 DIAGNOSIS — U071 COVID-19: Secondary | ICD-10-CM | POA: Diagnosis not present

## 2019-08-13 DIAGNOSIS — I2699 Other pulmonary embolism without acute cor pulmonale: Secondary | ICD-10-CM | POA: Diagnosis not present

## 2019-08-13 DIAGNOSIS — R5381 Other malaise: Secondary | ICD-10-CM | POA: Diagnosis not present

## 2019-08-13 DIAGNOSIS — J9601 Acute respiratory failure with hypoxia: Secondary | ICD-10-CM | POA: Diagnosis not present

## 2019-08-13 DIAGNOSIS — J1282 Pneumonia due to coronavirus disease 2019: Secondary | ICD-10-CM | POA: Diagnosis not present

## 2019-08-13 DIAGNOSIS — I2510 Atherosclerotic heart disease of native coronary artery without angina pectoris: Secondary | ICD-10-CM | POA: Diagnosis not present

## 2019-08-17 DIAGNOSIS — H353211 Exudative age-related macular degeneration, right eye, with active choroidal neovascularization: Secondary | ICD-10-CM | POA: Diagnosis not present

## 2019-08-17 DIAGNOSIS — H353121 Nonexudative age-related macular degeneration, left eye, early dry stage: Secondary | ICD-10-CM | POA: Diagnosis not present

## 2019-08-17 DIAGNOSIS — H43813 Vitreous degeneration, bilateral: Secondary | ICD-10-CM | POA: Diagnosis not present

## 2019-08-18 DIAGNOSIS — I2699 Other pulmonary embolism without acute cor pulmonale: Secondary | ICD-10-CM | POA: Diagnosis not present

## 2019-08-18 DIAGNOSIS — J1282 Pneumonia due to coronavirus disease 2019: Secondary | ICD-10-CM | POA: Diagnosis not present

## 2019-08-18 DIAGNOSIS — I2510 Atherosclerotic heart disease of native coronary artery without angina pectoris: Secondary | ICD-10-CM | POA: Diagnosis not present

## 2019-08-18 DIAGNOSIS — J9601 Acute respiratory failure with hypoxia: Secondary | ICD-10-CM | POA: Diagnosis not present

## 2019-08-18 DIAGNOSIS — R5381 Other malaise: Secondary | ICD-10-CM | POA: Diagnosis not present

## 2019-08-18 DIAGNOSIS — U071 COVID-19: Secondary | ICD-10-CM | POA: Diagnosis not present

## 2019-08-20 DIAGNOSIS — U071 COVID-19: Secondary | ICD-10-CM | POA: Diagnosis not present

## 2019-08-20 DIAGNOSIS — I2510 Atherosclerotic heart disease of native coronary artery without angina pectoris: Secondary | ICD-10-CM | POA: Diagnosis not present

## 2019-08-20 DIAGNOSIS — J9601 Acute respiratory failure with hypoxia: Secondary | ICD-10-CM | POA: Diagnosis not present

## 2019-08-20 DIAGNOSIS — J1282 Pneumonia due to coronavirus disease 2019: Secondary | ICD-10-CM | POA: Diagnosis not present

## 2019-08-20 DIAGNOSIS — R5381 Other malaise: Secondary | ICD-10-CM | POA: Diagnosis not present

## 2019-08-20 DIAGNOSIS — I2699 Other pulmonary embolism without acute cor pulmonale: Secondary | ICD-10-CM | POA: Diagnosis not present

## 2019-08-23 ENCOUNTER — Other Ambulatory Visit: Payer: Self-pay

## 2019-08-23 ENCOUNTER — Encounter: Payer: Self-pay | Admitting: Cardiology

## 2019-08-23 ENCOUNTER — Ambulatory Visit (INDEPENDENT_AMBULATORY_CARE_PROVIDER_SITE_OTHER): Payer: Medicare Other | Admitting: Cardiology

## 2019-08-23 VITALS — BP 120/78 | HR 92 | Ht 65.0 in | Wt 240.0 lb

## 2019-08-23 DIAGNOSIS — I4891 Unspecified atrial fibrillation: Secondary | ICD-10-CM | POA: Diagnosis not present

## 2019-08-23 DIAGNOSIS — Z86711 Personal history of pulmonary embolism: Secondary | ICD-10-CM

## 2019-08-23 DIAGNOSIS — U071 COVID-19: Secondary | ICD-10-CM | POA: Diagnosis not present

## 2019-08-23 DIAGNOSIS — E782 Mixed hyperlipidemia: Secondary | ICD-10-CM | POA: Diagnosis not present

## 2019-08-23 DIAGNOSIS — Z1329 Encounter for screening for other suspected endocrine disorder: Secondary | ICD-10-CM

## 2019-08-23 DIAGNOSIS — I25118 Atherosclerotic heart disease of native coronary artery with other forms of angina pectoris: Secondary | ICD-10-CM | POA: Diagnosis not present

## 2019-08-23 HISTORY — DX: Personal history of pulmonary embolism: Z86.711

## 2019-08-23 NOTE — Patient Instructions (Signed)
Medication Instructions:  No medication changes *If you need a refill on your cardiac medications before your next appointment, please call your pharmacy*   Lab Work: You had a BMET and TSH today. If you have labs (blood work) drawn today and your tests are completely normal, you will receive your results only by: Marland Kitchen MyChart Message (if you have MyChart) OR . A paper copy in the mail If you have any lab test that is abnormal or we need to change your treatment, we will call you to review the results.   Testing/Procedures: None ordered   Follow-Up: At [facility], you and your health needs are our priority.  As part of our continuing mission to provide you with exceptional heart care, we have created designated Provider Care Teams.  These Care Teams include your primary Cardiologist (physician) and Advanced Practice Providers (APPs -  Physician Assistants and Nurse Practitioners) who all work together to provide you with the care you need, when you need it.  We recommend signing up for the patient portal called "MyChart".  Sign up information is provided on this After Visit Summary.  MyChart is used to connect with patients for Virtual Visits (Telemedicine).  Patients are able to view lab/test results, encounter notes, upcoming appointments, etc.  Non-urgent messages can be sent to your provider as well.   To learn more about what you can do with MyChart, go to [email or web address].    Your next appointment:   1 month(s)  The format for your next appointment:   In Person  Provider:   Jyl Heinz, MD   Other Instructions NA

## 2019-08-23 NOTE — Progress Notes (Signed)
Cardiology Office Note:    Date:  08/23/2019   ID:  Miranda Moses, DOB [birth year], MRN [id number]  PCP:  [name], PA-C  Cardiologist:  Jenean Lindau, MD   Referring MD: [name], PA-C    ASSESSMENT:    1. Mixed hyperlipidemia   2. New onset atrial fibrillation ([place])   3. Coronary artery disease of native artery of native heart with stable angina pectoris ([place])   4. COVID-19 virus infection   5. History of pulmonary embolism    PLAN:    In order of problems listed above:  1. Secondary prevention stressed with the patient.  Importance of compliance with diet and medication stressed and she vocalized understanding.  Her blood pressure is stable. 2. Coronary artery disease: Stable at this time.  Coronary angiography report from the past was reviewed this was CT coronary angiography. 3. Newly diagnosed atrial fibrillation:I discussed with the patient atrial fibrillation, disease process. Management and therapy including rate and rhythm control, anticoagulation benefits and potential risks were discussed extensively with the patient. Patient had multiple questions which were answered to patient's satisfaction. 4. Essential hypertension: Blood pressure is stable patient will have blood work today including Chem-7 and I will discuss the results with her. 5. Obesity: Weight reduction was stressed and she promises to do better. 6. Post COVID-19 pneumonia: Recovering and feeling better. 7. She will be seen in follow-up appointment in a month or earlier if she has any concerns.  At that time I will do a CT scan to assess the lungs and possibility of amiodarone therapy at that point to consider cardioversion was discussed.  She vocalized understanding.  Total time for this evaluation was 40 minutes.   Medication Adjustments/Labs and Tests Ordered: Current medicines are reviewed at length with the patient today.  Concerns regarding medicines are outlined above.  No orders of the defined  types were placed in this encounter.  No orders of the defined types were placed in this encounter.    Chief Complaint  Patient presents with  . Hospitalization Follow-up     History of Present Illness:    Miranda Moses is a 79 y.o. female.  Patient has past medical history of coronary artery disease, essential hypertension, dyslipidemia, prediabetic with morbid obesity.  She had a COVID-19 infection with significant pneumonia and significant pulmonary embolism.  For this reason she was treated and placed on anticoagulation.  I reviewed extensive records from [facility].  She also had atrial fibrillation with well-controlled ventricular rate at this time.  She denies any chest pain orthopnea or PND.  She leads a sedentary lifestyle.  She is morbidly obese.  She ambulates with a walker.  At the time of my evaluation, the patient is alert awake oriented and in no distress.  Past Medical History:  Diagnosis Date  . CAD in native artery    by CT coronary 10/2018 >> medical therapy   . Chronic diastolic (congestive) heart failure ([place])   . GERD (gastroesophageal reflux disease)   . Hyperlipidemia   . Hypertension   . Pre-diabetes   . Pulmonary hypertension ([place])     Past Surgical History:  Procedure Laterality Date  . ABDOMINAL HYSTERECTOMY    . CATARACT EXTRACTION Left 03/2014  . CATARACT EXTRACTION Right 05/2014  . COLONOSCOPY  11/05/2013    Current Medications: Current Meds  Medication Sig  . albuterol (VENTOLIN HFA) 108 (90 Base) MCG/ACT inhaler Inhale into the lungs every 6 (six) hours as needed.  Marland Kitchen  apixaban (ELIQUIS) 5 MG TABS tablet Take 2 tablets twice daily for 3 days then take 1 tablet twice daily thereafter  . aspirin EC 81 MG tablet Take 81 mg by mouth daily.  Marland Kitchen atorvastatin (LIPITOR) 20 MG tablet Take 20 mg by mouth daily.  . Cholecalciferol (VITAMIN D3 PO) Take 2 capsules by mouth daily.  . digoxin (LANOXIN) 0.125 MG tablet Take 1 tablet (0.125 mg total) by  mouth daily.  . furosemide (LASIX) 40 MG tablet Take 1 tablet (40 mg total) by mouth daily as needed for edema.  . isosorbide mononitrate (IMDUR) 30 MG 24 hr tablet TAKE 1 TABLET(30 MG) BY MOUTH DAILY (Patient taking differently: Take 30 mg by mouth daily. )  . metoprolol tartrate (LOPRESSOR) 25 MG tablet Take 1 tablet (25 mg total) by mouth 2 (two) times daily.  . Multiple Vitamin (MULTIVITAMIN WITH MINERALS) TABS tablet Take 1 tablet by mouth daily.  Marland Kitchen omeprazole (PRILOSEC) 20 MG capsule Take 20 mg by mouth daily.   Marland Kitchen triamcinolone (NASACORT ALLERGY 24HR) 55 MCG/ACT AERO nasal inhaler Place 1 spray into the nose daily as needed (allergies).  Marland Kitchen ZINC-VITAMIN C PO Take 2 tablets by mouth daily.     Allergies:   Clarithromycin, Codeine, and Penicillins   Social History   Socioeconomic History  . Marital status: Widowed    Spouse name: Not on file  . Number of children: 1  . Years of education: Not on file  . Highest education level: Not on file  Occupational History  . Occupation: retired  Tobacco Use  . Smoking status: Never Smoker  . Smokeless tobacco: Never Used  Substance and Sexual Activity  . Alcohol use: Never  . Drug use: Never  . Sexual activity: Not on file  Other Topics Concern  . Not on file  Social History Narrative  . Not on file   Social Determinants of Health   Financial Resource Strain:   . Difficulty of Paying Living Expenses: Not on file  Food Insecurity:   . Worried About Charity fundraiser in the Last Year: Not on file  . Ran Out of Food in the Last Year: Not on file  Transportation Needs:   . Lack of Transportation (Medical): Not on file  . Lack of Transportation (Non-Medical): Not on file  Physical Activity:   . Days of Exercise per Week: Not on file  . Minutes of Exercise per Session: Not on file  Stress:   . Feeling of Stress : Not on file  Social Connections:   . Frequency of Communication with Friends and Family: Not on file  . Frequency of  Social Gatherings with Friends and Family: Not on file  . Attends Religious Services: Not on file  . Active Member of Clubs or Organizations: Not on file  . Attends Archivist Meetings: Not on file  . Marital Status: Not on file     Family History: The patient's family history includes Heart disease in her father.  ROS:   Please see the history of present illness.    All other systems reviewed and are negative.  EKGs/Labs/Other Studies Reviewed:    The following studies were reviewed today: Final [99]  PACS Intelerad Image Link  Show images for CT CORONARY FRACTIONAL FLOW RESERVE FLUID ANALYSIS  Study Result  EXAM: CT FFR ANALYSIS  CLINICAL DATA:  79 year old female with abnormal coronary CTA.  FINDINGS: FFRct analysis was performed on the original cardiac CT angiogram dataset. Diagrammatic representation of  the FFRct analysis is provided in a separate PDF document in PACS. This dictation was created using the PDF document and an interactive 3D model of the results. 3D model is not available in the EMR/PACS. Normal FFR range is >0.80.  1. Left Main:  No significant stenosis.  2. LAD: No significant stenosis. 3. LCX: Proximal: 0.91, distal: 0.74. 4. RCA: No significant stenosis.  IMPRESSION: 1. CT FFR analysis showed significant stenosis in the distal portion of a small non-dominant LCX artery. An aggressive medical management is recommended.   Electronically Signed   By: Ena Dawley   On: 11/11/2018 19:44   IMPRESSIONS    1. Left ventricular ejection fraction, by visual estimation, is 55 to  60%.  2. Global right ventricle has moderately reduced systolic function.The  right ventricular size is moderately enlarged. no increase in right  ventricular wall thickness.  3. The RV is moderately dilated with moderately reduced RV function.  4. Right atrial size was severely dilated.  5. The mitral valve is grossly normal. Mild mitral  valve regurgitation.  6. The tricuspid valve was normal in structure. Tricuspid valve  regurgitation is mild.  7. Tricuspid valve regurgitation is mild.  8. The pulmonic valve was normal in structure. Pulmonic valve  regurgitation is mild by color flow Doppler.  9. Mildly elevated pulmonary artery systolic pressure.    Recent Labs: 08/28/2018: NT-Pro BNP 154 07/23/2019: TSH 0.819 07/26/2019: B Natriuretic Peptide 633.2 07/27/2019: Magnesium 1.9 08/09/2019: ALT 23; BUN 19; Creatinine, Ser 0.82; Hemoglobin 12.1; Platelets 166; Potassium 4.0; Sodium 141  Recent Lipid Panel    Component Value Date/Time   CHOL 155 08/09/2019 1029   TRIG 116 08/09/2019 1029   HDL 44 08/09/2019 1029   CHOLHDL 3.5 08/09/2019 1029   LDLCALC 90 08/09/2019 1029    Physical Exam:    VS:  BP 120/78   Pulse 92   Ht 5\' 5"  (1.651 m)   Wt 240 lb (108.9 kg)   SpO2 95%   BMI 39.94 kg/m     Wt Readings from Last 3 Encounters:  08/23/19 240 lb (108.9 kg)  08/09/19 230 lb (104.3 kg)  07/23/19 249 lb 3.2 oz (113 kg)     GEN: Patient is in no acute distress HEENT: Normal NECK: No JVD; No carotid bruits LYMPHATICS: No lymphadenopathy CARDIAC: Hear sounds regular, 2/6 systolic murmur at the apex. RESPIRATORY:  Clear to auscultation without rales, wheezing or rhonchi  ABDOMEN: Soft, non-tender, non-distended MUSCULOSKELETAL:  No edema; No deformity  SKIN: Warm and dry NEUROLOGIC:  Alert and oriented x 3 PSYCHIATRIC:  Normal affect   Signed, Jenean Lindau, MD  08/23/2019 8:52 AM    [place]

## 2019-08-24 LAB — BASIC METABOLIC PANEL
BUN/Creatinine Ratio: 22 (ref 12–28)
BUN: 17 mg/dL (ref 8–27)
CO2: 22 mmol/L (ref 20–29)
Calcium: 9.3 mg/dL (ref 8.7–10.3)
Chloride: 104 mmol/L (ref 96–106)
Creatinine, Ser: 0.79 mg/dL (ref 0.57–1.00)
GFR calc Af Amer: 84 mL/min/{1.73_m2} (ref >59)
GFR calc non Af Amer: 73 mL/min/{1.73_m2} (ref >59)
Glucose: 130 mg/dL — ABNORMAL HIGH (ref 65–99)
Potassium: 3.8 mmol/L (ref 3.5–5.2)
Sodium: 141 mmol/L (ref 134–144)

## 2019-08-24 LAB — TSH: TSH: 1.59 u[IU]/mL (ref 0.450–4.500)

## 2019-08-26 ENCOUNTER — Other Ambulatory Visit: Payer: Self-pay | Admitting: *Deleted

## 2019-08-26 ENCOUNTER — Telehealth: Payer: Self-pay | Admitting: Cardiology

## 2019-08-26 DIAGNOSIS — R5381 Other malaise: Secondary | ICD-10-CM | POA: Diagnosis not present

## 2019-08-26 DIAGNOSIS — J1282 Pneumonia due to coronavirus disease 2019: Secondary | ICD-10-CM | POA: Diagnosis not present

## 2019-08-26 DIAGNOSIS — J9601 Acute respiratory failure with hypoxia: Secondary | ICD-10-CM | POA: Diagnosis not present

## 2019-08-26 DIAGNOSIS — I2699 Other pulmonary embolism without acute cor pulmonale: Secondary | ICD-10-CM | POA: Diagnosis not present

## 2019-08-26 DIAGNOSIS — U071 COVID-19: Secondary | ICD-10-CM | POA: Diagnosis not present

## 2019-08-26 DIAGNOSIS — I2510 Atherosclerotic heart disease of native coronary artery without angina pectoris: Secondary | ICD-10-CM | POA: Diagnosis not present

## 2019-08-26 MED ORDER — DIGOXIN 125 MCG PO TABS: 0.1250 mg | ORAL_TABLET | Freq: Every day | ORAL | 1 refills | Status: DC

## 2019-08-26 MED ORDER — APIXABAN 5 MG PO TABS: ORAL_TABLET | ORAL | 1 refills | Status: DC

## 2019-08-26 NOTE — Telephone Encounter (Signed)
Refill sent.

## 2019-08-26 NOTE — Telephone Encounter (Signed)
*  STAT* If patient is at the pharmacy, call can be transferred to refill team.   1. Which medications need to be refilled? (please list name of each medication and dose if known)  apixaban (ELIQUIS) 5 MG TABS tablet digoxin (LANOXIN) 0.125 MG tablet  2. Which pharmacy/location (including street and city if local pharmacy) is medication to be sent to? [facility] [place], [place]  3. Do they need a 30 day or 90 day supply? 90 day

## 2019-08-30 DIAGNOSIS — I2699 Other pulmonary embolism without acute cor pulmonale: Secondary | ICD-10-CM | POA: Diagnosis not present

## 2019-08-30 DIAGNOSIS — J9601 Acute respiratory failure with hypoxia: Secondary | ICD-10-CM | POA: Diagnosis not present

## 2019-08-30 DIAGNOSIS — Z7982 Long term (current) use of aspirin: Secondary | ICD-10-CM | POA: Diagnosis not present

## 2019-08-30 DIAGNOSIS — I2721 Secondary pulmonary arterial hypertension: Secondary | ICD-10-CM | POA: Diagnosis not present

## 2019-08-30 DIAGNOSIS — I739 Peripheral vascular disease, unspecified: Secondary | ICD-10-CM | POA: Diagnosis not present

## 2019-08-30 DIAGNOSIS — K219 Gastro-esophageal reflux disease without esophagitis: Secondary | ICD-10-CM | POA: Diagnosis not present

## 2019-08-30 DIAGNOSIS — Z7952 Long term (current) use of systemic steroids: Secondary | ICD-10-CM | POA: Diagnosis not present

## 2019-08-30 DIAGNOSIS — U071 COVID-19: Secondary | ICD-10-CM | POA: Diagnosis not present

## 2019-08-30 DIAGNOSIS — I110 Hypertensive heart disease with heart failure: Secondary | ICD-10-CM | POA: Diagnosis not present

## 2019-08-30 DIAGNOSIS — I2510 Atherosclerotic heart disease of native coronary artery without angina pectoris: Secondary | ICD-10-CM | POA: Diagnosis not present

## 2019-08-30 DIAGNOSIS — E782 Mixed hyperlipidemia: Secondary | ICD-10-CM | POA: Diagnosis not present

## 2019-08-30 DIAGNOSIS — R5381 Other malaise: Secondary | ICD-10-CM | POA: Diagnosis not present

## 2019-08-30 DIAGNOSIS — J1282 Pneumonia due to coronavirus disease 2019: Secondary | ICD-10-CM | POA: Diagnosis not present

## 2019-08-30 DIAGNOSIS — Z9181 History of falling: Secondary | ICD-10-CM | POA: Diagnosis not present

## 2019-08-30 DIAGNOSIS — I4891 Unspecified atrial fibrillation: Secondary | ICD-10-CM | POA: Diagnosis not present

## 2019-08-30 DIAGNOSIS — Z7901 Long term (current) use of anticoagulants: Secondary | ICD-10-CM | POA: Diagnosis not present

## 2019-08-30 DIAGNOSIS — I5032 Chronic diastolic (congestive) heart failure: Secondary | ICD-10-CM | POA: Diagnosis not present

## 2019-08-30 NOTE — Telephone Encounter (Signed)
Follow up:    Patient calling stating that some one was helping with a cheaper medication. Patient has some question concering this matter. Please call patient back.

## 2019-08-30 NOTE — Telephone Encounter (Signed)
Spoke with pt and informed her that the [place] office is currently out of the Eliquis 5 mg. [facility] currently has samples and she will go there until we hear from her prior auth.

## 2019-08-30 NOTE — Telephone Encounter (Signed)
I spoke with patient. She reports she has been on Eliquis for a month. She reports pharmacy has not filled because they are waiting to hear from patient's insurance.  Patient has been told by pharmacy her cost could possibly be $1100 and she cannot afford this. Patient took her last dose of Eliquis and is completely out.  I told her I would check with [place] office to see if any samples and if she could get assistance

## 2019-09-01 DIAGNOSIS — I2510 Atherosclerotic heart disease of native coronary artery without angina pectoris: Secondary | ICD-10-CM | POA: Diagnosis not present

## 2019-09-01 DIAGNOSIS — J1282 Pneumonia due to coronavirus disease 2019: Secondary | ICD-10-CM | POA: Diagnosis not present

## 2019-09-01 DIAGNOSIS — J9601 Acute respiratory failure with hypoxia: Secondary | ICD-10-CM | POA: Diagnosis not present

## 2019-09-01 DIAGNOSIS — U071 COVID-19: Secondary | ICD-10-CM | POA: Diagnosis not present

## 2019-09-01 DIAGNOSIS — R5381 Other malaise: Secondary | ICD-10-CM | POA: Diagnosis not present

## 2019-09-01 DIAGNOSIS — I2699 Other pulmonary embolism without acute cor pulmonale: Secondary | ICD-10-CM | POA: Diagnosis not present

## 2019-09-07 NOTE — Telephone Encounter (Signed)
Patient states [facility] [place], [place] pharmacy is requesting prior authorization from Dr. Geraldo Pitter in order to fill apixaban (ELIQUIS) 5 MG TABS tablet medication. Please assist.

## 2019-09-09 DIAGNOSIS — J9601 Acute respiratory failure with hypoxia: Secondary | ICD-10-CM | POA: Diagnosis not present

## 2019-09-09 DIAGNOSIS — I2510 Atherosclerotic heart disease of native coronary artery without angina pectoris: Secondary | ICD-10-CM | POA: Diagnosis not present

## 2019-09-09 DIAGNOSIS — U071 COVID-19: Secondary | ICD-10-CM | POA: Diagnosis not present

## 2019-09-09 DIAGNOSIS — R5381 Other malaise: Secondary | ICD-10-CM | POA: Diagnosis not present

## 2019-09-09 DIAGNOSIS — I2699 Other pulmonary embolism without acute cor pulmonale: Secondary | ICD-10-CM | POA: Diagnosis not present

## 2019-09-09 DIAGNOSIS — J1282 Pneumonia due to coronavirus disease 2019: Secondary | ICD-10-CM | POA: Diagnosis not present

## 2019-09-10 ENCOUNTER — Other Ambulatory Visit: Payer: Self-pay | Admitting: Family Medicine

## 2019-09-29 ENCOUNTER — Telehealth: Payer: Self-pay | Admitting: Cardiology

## 2019-09-29 MED ORDER — METOPROLOL TARTRATE 25 MG PO TABS: 25.0000 mg | ORAL_TABLET | Freq: Two times a day (BID) | ORAL | 1 refills | Status: DC

## 2019-09-29 NOTE — Telephone Encounter (Signed)
Rx refill sent to pharmacy. 

## 2019-09-29 NOTE — Telephone Encounter (Signed)
New message      *STAT* If patient is at the pharmacy, call can be transferred to refill team.   1. Which medications need to be refilled? (please list name of each medication and dose if known) metoprolol 25mg   2. Which pharmacy/location (including street and city if local pharmacy) is medication to be sent to? [facility] [address] in [place]  3. Do they need a 30 day or 90 day supply? [place]

## 2019-10-07 ENCOUNTER — Other Ambulatory Visit: Payer: Self-pay | Admitting: Cardiology

## 2019-10-08 ENCOUNTER — Encounter: Payer: Self-pay | Admitting: Cardiology

## 2019-10-08 ENCOUNTER — Ambulatory Visit (INDEPENDENT_AMBULATORY_CARE_PROVIDER_SITE_OTHER): Payer: Medicare Other | Admitting: Cardiology

## 2019-10-08 ENCOUNTER — Other Ambulatory Visit: Payer: Self-pay

## 2019-10-08 ENCOUNTER — Encounter (INDEPENDENT_AMBULATORY_CARE_PROVIDER_SITE_OTHER): Payer: Self-pay

## 2019-10-08 VITALS — BP 144/70 | HR 70 | Temp 97.6°F | Ht 67.0 in | Wt 240.8 lb

## 2019-10-08 DIAGNOSIS — I4891 Unspecified atrial fibrillation: Secondary | ICD-10-CM

## 2019-10-08 DIAGNOSIS — I2721 Secondary pulmonary arterial hypertension: Secondary | ICD-10-CM | POA: Diagnosis not present

## 2019-10-08 DIAGNOSIS — I110 Hypertensive heart disease with heart failure: Secondary | ICD-10-CM | POA: Diagnosis not present

## 2019-10-08 DIAGNOSIS — Z86711 Personal history of pulmonary embolism: Secondary | ICD-10-CM | POA: Diagnosis not present

## 2019-10-08 DIAGNOSIS — E559 Vitamin D deficiency, unspecified: Secondary | ICD-10-CM | POA: Diagnosis not present

## 2019-10-08 DIAGNOSIS — E782 Mixed hyperlipidemia: Secondary | ICD-10-CM

## 2019-10-08 NOTE — Addendum Note (Signed)
Addended by: Truddie Hidden on: 10/08/2019 10:18 AM   Modules accepted: Orders

## 2019-10-08 NOTE — Progress Notes (Signed)
Cardiology Office Note:    Date:  10/08/2019   ID:  Miranda Moses, DOB Dec 20, 1940, MRN [id number]  PCP:  [name], PA-C  Cardiologist:  Jenean Lindau, MD   Referring MD: [name], PA-C    ASSESSMENT:    1. Hypertensive heart disease with heart failure ([place])   2. Mixed hyperlipidemia   3. New onset atrial fibrillation ([place])   4. History of pulmonary embolism    PLAN:    In order of problems listed above:  1. Coronary artery disease: Secondary prevention stressed with the patient.  Importance of compliance with diet medication stressed and she vocalized understanding.  Importance of regular exercise stressed and she plans to do better. 2. Essential hypertension: Blood pressure is stable and diet was emphasized 3. Mixed dyslipidemia: Patient on statin therapy and we will have blood work today as she is fasting. 4. Paroxysmal atrial fibrillation: I discussed with the patient atrial fibrillation, disease process. Management and therapy including rate and rhythm control, anticoagulation benefits and potential risks were discussed extensively with the patient. Patient had multiple questions which were answered to patient's satisfaction.  Obesity: I discussed weight reduction with her at length and exercise and she promises to do better. Patient will be seen in follow-up appointment in 6 months or earlier if the patient has any concerns    Medication Adjustments/Labs and Tests Ordered: Current medicines are reviewed at length with the patient today.  Concerns regarding medicines are outlined above.  No orders of the defined types were placed in this encounter.  No orders of the defined types were placed in this encounter.    No chief complaint on file.    History of Present Illness:    Miranda Moses is a 79 y.o. female.  Patient has past medical history of coronary artery disease.  She has undergone treatment for COVID-19 pneumonitis and is feeling much better by the day.  No  chest pain orthopnea or PND.  She is on anticoagulation for pulmonary embolism.  She has history of hypertension dyslipidemia and is significantly overweight.  She is trying to exercise on a regular basis.  At the time of my evaluation, the patient is alert awake oriented and in no distress.  Past Medical History:  Diagnosis Date  . Angina pectoris ([place]) 02/18/2018  . CAD in native artery    by CT coronary 10/2018 >> medical therapy   . CHF (congestive heart failure), NYHA class I, chronic, diastolic ([place]) 0000000  . Chronic diastolic (congestive) heart failure ([place])   . Coronary artery disease of native artery of native heart with stable angina pectoris ([place]) 08/09/2019  . COVID-19 virus infection   . Family history of coronary artery disease 01/30/2018  . Gastro-esophageal reflux disease without esophagitis 01/30/2018  . GERD (gastroesophageal reflux disease)   . History of pulmonary embolism 08/23/2019  . Hyperlipidemia   . Hypertension   . Hypertensive heart disease with heart failure ([place]) 01/30/2018  . Mixed hyperlipidemia 02/18/2018  . New onset atrial fibrillation ([place])   . PAD (peripheral artery disease) ([place]) 08/28/2018  . PAH (pulmonary artery hypertension) ([place]) 08/28/2018  . Pre-diabetes   . Pulmonary embolus ([place])   . Pulmonary hypertension ([place])   . Vitamin D deficiency 08/09/2019    Past Surgical History:  Procedure Laterality Date  . ABDOMINAL HYSTERECTOMY    . CATARACT EXTRACTION Left 03/2014  . CATARACT EXTRACTION Right 05/2014  . COLONOSCOPY  11/05/2013    Current Medications: Current Meds  Medication  Sig  . aspirin EC 81 MG tablet Take 81 mg by mouth daily.  Marland Kitchen atorvastatin (LIPITOR) 20 MG tablet TAKE 1 TABLET(20 MG) BY MOUTH EVERY DAY  . Cholecalciferol (VITAMIN D3 PO) Take 2 capsules by mouth daily.  . digoxin (LANOXIN) 0.125 MG tablet Take 1 tablet (0.125 mg total) by mouth daily.  . furosemide (LASIX) 40 MG tablet Take 1 tablet (40 mg total) by mouth daily as  needed for edema.  . isosorbide mononitrate (IMDUR) 30 MG 24 hr tablet TAKE 1 TABLET(30 MG) BY MOUTH DAILY  . metoprolol tartrate (LOPRESSOR) 25 MG tablet Take 1 tablet (25 mg total) by mouth 2 (two) times daily.  . Multiple Vitamin (MULTIVITAMIN WITH MINERALS) TABS tablet Take 1 tablet by mouth daily.  Marland Kitchen omeprazole (PRILOSEC) 20 MG capsule Take 20 mg by mouth daily.   Marland Kitchen triamcinolone (NASACORT ALLERGY 24HR) 55 MCG/ACT AERO nasal inhaler Place 1 spray into the nose daily as needed (allergies).  Marland Kitchen ZINC-VITAMIN C PO Take 2 tablets by mouth daily.     Allergies:   Clarithromycin, Codeine, and Penicillins   Social History   Socioeconomic History  . Marital status: Widowed    Spouse name: Not on file  . Number of children: 1  . Years of education: Not on file  . Highest education level: Not on file  Occupational History  . Occupation: retired  Tobacco Use  . Smoking status: Never Smoker  . Smokeless tobacco: Never Used  Substance and Sexual Activity  . Alcohol use: Never  . Drug use: Never  . Sexual activity: Not on file  Other Topics Concern  . Not on file  Social History Narrative  . Not on file   Social Determinants of Health   Financial Resource Strain:   . Difficulty of Paying Living Expenses:   Food Insecurity:   . Worried About Charity fundraiser in the Last Year:   . Arboriculturist in the Last Year:   Transportation Needs:   . Film/video editor (Medical):   Marland Kitchen Lack of Transportation (Non-Medical):   Physical Activity:   . Days of Exercise per Week:   . Minutes of Exercise per Session:   Stress:   . Feeling of Stress :   Social Connections:   . Frequency of Communication with Friends and Family:   . Frequency of Social Gatherings with Friends and Family:   . Attends Religious Services:   . Active Member of Clubs or Organizations:   . Attends Archivist Meetings:   Marland Kitchen Marital Status:      Family History: The patient's family history includes  Heart disease in her father.  ROS:   Please see the history of present illness.    All other systems reviewed and are negative.  EKGs/Labs/Other Studies Reviewed:    The following studies were reviewed today: IMPRESSIONS    1. Left ventricular ejection fraction, by visual estimation, is 55 to  60%.  2. Global right ventricle has moderately reduced systolic function.The  right ventricular size is moderately enlarged. no increase in right  ventricular wall thickness.  3. The RV is moderately dilated with moderately reduced RV function.  4. Right atrial size was severely dilated.  5. The mitral valve is grossly normal. Mild mitral valve regurgitation.  6. The tricuspid valve was normal in structure. Tricuspid valve  regurgitation is mild.  7. Tricuspid valve regurgitation is mild.  8. The pulmonic valve was normal in structure. Pulmonic valve  regurgitation is mild by color flow Doppler.  9. Mildly elevated pulmonary artery systolic pressure.    Recent Labs: 07/26/2019: B Natriuretic Peptide 633.2 07/27/2019: Magnesium 1.9 08/09/2019: ALT 23; Hemoglobin 12.1; Platelets 166 08/23/2019: BUN 17; Creatinine, Ser 0.79; Potassium 3.8; Sodium 141; TSH 1.590  Recent Lipid Panel    Component Value Date/Time   CHOL 155 08/09/2019 1029   TRIG 116 08/09/2019 1029   HDL 44 08/09/2019 1029   CHOLHDL 3.5 08/09/2019 1029   LDLCALC 90 08/09/2019 1029    Physical Exam:    VS:  BP (!) 144/70   Pulse 70   Temp 97.6 F (36.4 C)   Ht 5\' 7"  (1.702 m)   Wt 240 lb 12.8 oz (109.2 kg)   SpO2 96%   BMI 37.71 kg/m     Wt Readings from Last 3 Encounters:  10/08/19 240 lb 12.8 oz (109.2 kg)  08/23/19 240 lb (108.9 kg)  08/09/19 230 lb (104.3 kg)     GEN: Patient is in no acute distress HEENT: Normal NECK: No JVD; No carotid bruits LYMPHATICS: No lymphadenopathy CARDIAC: Hear sounds regular, 2/6 systolic murmur at the apex. RESPIRATORY:  Clear to auscultation without rales, wheezing  or rhonchi  ABDOMEN: Soft, non-tender, non-distended MUSCULOSKELETAL:  No edema; No deformity  SKIN: Warm and dry NEUROLOGIC:  Alert and oriented x 3 PSYCHIATRIC:  Normal affect   Signed, Jenean Lindau, MD  10/08/2019 9:36 AM    [facility]

## 2019-10-08 NOTE — Patient Instructions (Signed)
Medication Instructions:  No medication changes *If you need a refill on your cardiac medications before your next appointment, please call your pharmacy*   Lab Work: Your physician recommends that you have labs today in the office. We are checking a BMET, CBC, TSH, LFT's, Lipids and Vitamin D. If you have labs (blood work) drawn today and your tests are completely normal, you will receive your results only by: Marland Kitchen MyChart Message (if you have MyChart) OR . A paper copy in the mail If you have any lab test that is abnormal or we need to change your treatment, we will call you to review the results.   Testing/Procedures: None ordered   Follow-Up: At [facility], you and your health needs are our priority.  As part of our continuing mission to provide you with exceptional heart care, we have created designated Provider Care Teams.  These Care Teams include your primary Cardiologist (physician) and Advanced Practice Providers (APPs -  Physician Assistants and Nurse Practitioners) who all work together to provide you with the care you need, when you need it.  We recommend signing up for the patient portal called "MyChart".  Sign up information is provided on this After Visit Summary.  MyChart is used to connect with patients for Virtual Visits (Telemedicine).  Patients are able to view lab/test results, encounter notes, upcoming appointments, etc.  Non-urgent messages can be sent to your provider as well.   To learn more about what you can do with MyChart, go to [email or web address].    Your next appointment:   4 month(s)  The format for your next appointment:   In Person  Provider:   Jyl Heinz, MD   Other Instructions NA

## 2019-10-09 LAB — CBC WITH DIFFERENTIAL/PLATELET
Basophils Absolute: 0.1 10*3/uL (ref 0.0–0.2)
Basos: 1 %
EOS (ABSOLUTE): 0.2 10*3/uL (ref 0.0–0.4)
Eos: 4 %
Hematocrit: 36.3 % (ref 34.0–46.6)
Hemoglobin: 11.5 g/dL (ref 11.1–15.9)
Immature Grans (Abs): 0 10*3/uL (ref 0.0–0.1)
Immature Granulocytes: 0 %
Lymphocytes Absolute: 0.9 10*3/uL (ref 0.7–3.1)
Lymphs: 17 %
MCH: 27.5 pg (ref 26.6–33.0)
MCHC: 31.7 g/dL (ref 31.5–35.7)
MCV: 87 fL (ref 79–97)
Monocytes Absolute: 0.5 10*3/uL (ref 0.1–0.9)
Monocytes: 9 %
Neutrophils Absolute: 3.7 10*3/uL (ref 1.4–7.0)
Neutrophils: 69 %
Platelets: 207 10*3/uL (ref 150–450)
RBC: 4.18 x10E6/uL (ref 3.77–5.28)
RDW: 14.8 % (ref 11.7–15.4)
WBC: 5.3 10*3/uL (ref 3.4–10.8)

## 2019-10-09 LAB — BASIC METABOLIC PANEL
BUN/Creatinine Ratio: 16 (ref 12–28)
BUN: 13 mg/dL (ref 8–27)
CO2: 23 mmol/L (ref 20–29)
Calcium: 9.3 mg/dL (ref 8.7–10.3)
Chloride: 102 mmol/L (ref 96–106)
Creatinine, Ser: 0.83 mg/dL (ref 0.57–1.00)
GFR calc Af Amer: 79 mL/min/{1.73_m2} (ref >59)
GFR calc non Af Amer: 68 mL/min/{1.73_m2} (ref >59)
Glucose: 134 mg/dL — ABNORMAL HIGH (ref 65–99)
Potassium: 3.9 mmol/L (ref 3.5–5.2)
Sodium: 141 mmol/L (ref 134–144)

## 2019-10-09 LAB — HEPATIC FUNCTION PANEL
ALT: 21 IU/L (ref 0–32)
AST: 29 IU/L (ref 0–40)
Albumin: 3.7 g/dL (ref 3.7–4.7)
Alkaline Phosphatase: 100 IU/L (ref 39–117)
Bilirubin Total: 0.9 mg/dL (ref 0.0–1.2)
Bilirubin, Direct: 0.26 mg/dL (ref 0.00–0.40)
Total Protein: 6.2 g/dL (ref 6.0–8.5)

## 2019-10-09 LAB — LIPID PANEL
Chol/HDL Ratio: 2.9 ratio (ref 0.0–4.4)
Cholesterol, Total: 143 mg/dL (ref 100–199)
HDL: 49 mg/dL (ref >39)
LDL Chol Calc (NIH): 68 mg/dL (ref 0–99)
Triglycerides: 149 mg/dL (ref 0–149)
VLDL Cholesterol Cal: 26 mg/dL (ref 5–40)

## 2019-10-09 LAB — VITAMIN D 25 HYDROXY (VIT D DEFICIENCY, FRACTURES): Vit D, 25-Hydroxy: 60 ng/mL (ref 30.0–100.0)

## 2019-10-09 LAB — TSH: TSH: 1.36 u[IU]/mL (ref 0.450–4.500)

## 2019-11-08 ENCOUNTER — Encounter: Payer: Self-pay | Admitting: Physician Assistant

## 2019-11-08 ENCOUNTER — Other Ambulatory Visit: Payer: Self-pay

## 2019-11-08 ENCOUNTER — Ambulatory Visit (INDEPENDENT_AMBULATORY_CARE_PROVIDER_SITE_OTHER): Payer: Medicare Other | Admitting: Physician Assistant

## 2019-11-08 VITALS — BP 138/82 | HR 74 | Temp 97.1°F | Ht 67.0 in | Wt 240.0 lb

## 2019-11-08 DIAGNOSIS — I110 Hypertensive heart disease with heart failure: Secondary | ICD-10-CM

## 2019-11-08 DIAGNOSIS — E559 Vitamin D deficiency, unspecified: Secondary | ICD-10-CM | POA: Diagnosis not present

## 2019-11-08 DIAGNOSIS — E782 Mixed hyperlipidemia: Secondary | ICD-10-CM

## 2019-11-08 MED ORDER — APIXABAN 5 MG PO TABS: ORAL_TABLET | ORAL | 2 refills | Status: DC

## 2019-11-08 NOTE — Progress Notes (Signed)
Established Patient Office Visit  Subjective:  Patient ID: Miranda Moses, female    DOB: 03/14/1941  Age: 79 y.o. MRN: [id number]  CC:  Chief Complaint  Patient presents with  . Hyperlipidemia  . Gastroesophageal Reflux  . Vitamin D deficiency    HPI [name] presents for chronic follow up hyperlipidemia  Mixed hyperlipidemia  Pt presents with hyperlipidemia.  Compliance with treatment has been good; The patient is compliant with medications, maintains a low cholesterol diet , follows up as directed ,  . The patient denies experiencing any hypercholesterolemia related symptoms.  Evidenced based information based on history , exam, and other sources has been used  for decision making. Pt currently on atorvastatin 20mg  qd  Pt presents for follow up of hypertension.  The patient is tolerating the medication well without side effects. Compliance with treatment has been good; including taking medication as directed , maintains a healthy diet and regular exercise regimen , and following up as directed. Patient was evaluated using exam, physical, labs and other information to perform evidence based decision making. Pt currently on metoprolol 25mg  bid   Pt has a history of chronic diastolic CHF - she is trying to do a low sodium diet and currently on lasix 40mg  daily as needed  Pt with history of afib - is currently following with cardiology and has appt with Dr [name] in august - she is currently on Eliquis 5mg  bid (also has history of PE in 07/2019) and digoxin 0.125mg  qd     Past Medical History:  Diagnosis Date  . Angina pectoris ([place]) 02/18/2018  . CAD in native artery    by CT coronary 10/2018 >> medical therapy   . CHF (congestive heart failure), NYHA class I, chronic, diastolic ([place]) 0000000  . Chronic diastolic (congestive) heart failure ([place])   . Coronary artery disease of native artery of native heart with stable angina pectoris ([place]) 08/09/2019  . COVID-19 virus infection     . Family history of coronary artery disease 01/30/2018  . Gastro-esophageal reflux disease without esophagitis 01/30/2018  . GERD (gastroesophageal reflux disease)   . History of pulmonary embolism 08/23/2019  . Hyperlipidemia   . Hypertension   . Hypertensive heart disease with heart failure ([place]) 01/30/2018  . Mixed hyperlipidemia 02/18/2018  . New onset atrial fibrillation ([place])   . PAD (peripheral artery disease) ([place]) 08/28/2018  . PAH (pulmonary artery hypertension) ([place]) 08/28/2018  . Pre-diabetes   . Pulmonary embolus ([place])   . Pulmonary hypertension ([place])   . Vitamin D deficiency 08/09/2019    Past Surgical History:  Procedure Laterality Date  . ABDOMINAL HYSTERECTOMY    . CATARACT EXTRACTION Left 03/2014  . CATARACT EXTRACTION Right 05/2014  . COLONOSCOPY  11/05/2013    Family History  Problem Relation Age of Onset  . Heart disease Father     Social History   Socioeconomic History  . Marital status: Widowed    Spouse name: Not on file  . Number of children: 1  . Years of education: Not on file  . Highest education level: Not on file  Occupational History  . Occupation: retired  Tobacco Use  . Smoking status: Never Smoker  . Smokeless tobacco: Never Used  Substance and Sexual Activity  . Alcohol use: Never  . Drug use: Never  . Sexual activity: Not on file  Other Topics Concern  . Not on file  Social History Narrative  . Not on file   Social Determinants of Health  Financial Resource Strain:   . Difficulty of Paying Living Expenses:   Food Insecurity:   . Worried About Charity fundraiser in the Last Year:   . Arboriculturist in the Last Year:   Transportation Needs:   . Film/video editor (Medical):   Marland Kitchen Lack of Transportation (Non-Medical):   Physical Activity:   . Days of Exercise per Week:   . Minutes of Exercise per Session:   Stress:   . Feeling of Stress :   Social Connections:   . Frequency of Communication with Friends and Family:   .  Frequency of Social Gatherings with Friends and Family:   . Attends Religious Services:   . Active Member of Clubs or Organizations:   . Attends Archivist Meetings:   Marland Kitchen Marital Status:   Intimate Partner Violence:   . Fear of Current or Ex-Partner:   . Emotionally Abused:   Marland Kitchen Physically Abused:   . Sexually Abused:      Current Outpatient Medications:  .  aspirin EC 81 MG tablet, Take 81 mg by mouth daily., Disp: , Rfl:  .  atorvastatin (LIPITOR) 20 MG tablet, TAKE 1 TABLET(20 MG) BY MOUTH EVERY DAY, Disp: 90 tablet, Rfl: 1 .  Cholecalciferol (VITAMIN D3 PO), Take 2 capsules by mouth daily., Disp: , Rfl:  .  digoxin (LANOXIN) 0.125 MG tablet, Take 1 tablet (0.125 mg total) by mouth daily., Disp: 90 tablet, Rfl: 1 .  furosemide (LASIX) 40 MG tablet, Take 1 tablet (40 mg total) by mouth daily as needed for edema., Disp: 30 tablet, Rfl: 0 .  isosorbide mononitrate (IMDUR) 30 MG 24 hr tablet, TAKE 1 TABLET(30 MG) BY MOUTH DAILY, Disp: 90 tablet, Rfl: 2 .  metoprolol tartrate (LOPRESSOR) 25 MG tablet, Take 1 tablet (25 mg total) by mouth 2 (two) times daily., Disp: 180 tablet, Rfl: 1 .  Multiple Vitamin (MULTIVITAMIN WITH MINERALS) TABS tablet, Take 1 tablet by mouth daily., Disp: , Rfl:  .  omeprazole (PRILOSEC) 20 MG capsule, Take 20 mg by mouth daily. , Disp: , Rfl:  .  triamcinolone (NASACORT ALLERGY 24HR) 55 MCG/ACT AERO nasal inhaler, Place 1 spray into the nose daily as needed (allergies)., Disp: , Rfl:  .  ZINC-VITAMIN C PO, Take 2 tablets by mouth daily., Disp: , Rfl:  .  apixaban (ELIQUIS) 5 MG TABS tablet, Ttake 1 tablet twice daily, Disp: 180 tablet, Rfl: 1   Allergies  Allergen Reactions  . Clarithromycin     Numbness of extremities  . Codeine Other (See Comments)    Caused pain  . Penicillins Rash    Did it involve swelling of the face/tongue/throat, SOB, or low BP? No Did it involve sudden or severe rash/hives, skin peeling, or any reaction on the inside of  your mouth or nose? Yes Did you need to seek medical attention at a hospital or doctor's office? Yes When did it last happen?20 years If all above answers are "NO", may proceed with cephalosporin use.     ROS CONSTITUTIONAL: Negative for chills, fatigue, fever, unintentional weight gain and unintentional weight loss.  E/N/T: Negative for ear pain, nasal congestion and sore throat.  CARDIOVASCULAR: Negative for chest pain, dizziness, palpitations and mild edema of extremities  RESPIRATORY: Negative for recent cough and dyspnea.  GASTROINTESTINAL: Negative for abdominal pain, acid reflux symptoms, constipation, diarrhea, nausea and vomiting.  MSK: Negative for arthralgias and myalgias.  INTEGUMENTARY: Negative for rash.  NEUROLOGICAL: Negative for dizziness and  headaches.  PSYCHIATRIC: Negative for sleep disturbance and to question depression screen.  Negative for depression, negative for anhedonia.        Objective:    PHYSICAL EXAM:   VS: BP 138/82 (BP Location: Left Arm, Patient Position: Sitting)   Pulse 74   Temp (!) 97.1 F (36.2 C) (Temporal)   Ht 5\' 7"  (1.702 m)   Wt 240 lb (108.9 kg)   SpO2 98%   BMI 37.59 kg/m   GEN: Well nourished, well developed, in no acute distress  HEENT: normal external ears and nose - normal external auditory canals and TMS - hearing grossly normal - normal nasal mucosa and septum - Lips, Teeth and Gums - normal  Oropharynx - normal mucosa, palate, and posterior pharynx Neck: no JVD or masses - no thyromegaly Cardiac: RRR; no murmurs, rubs, or gallops,no edema - no significant varicosities Respiratory:  normal respiratory rate and pattern with no distress - normal breath sounds with no rales, rhonchi, wheezes or rubs GI: normal bowel sounds, no masses or tenderness MS: no deformity or atrophy  Skin: warm and dry, no rash  Neuro:  Alert and Oriented x 3, Strength and sensation are intact - CN II-Xii grossly intact Psych: euthymic mood,  appropriate affect and demeanor      Health Maintenance Due  Topic Date Due  . COVID-19 Vaccine (1) Never done  . TETANUS/TDAP  Never done  . DEXA SCAN  Never done    There are no preventive care reminders to display for this patient.  Lab Results  Component Value Date   TSH 1.360 10/08/2019   Lab Results  Component Value Date   WBC 5.3 10/08/2019   HGB 11.5 10/08/2019   HCT 36.3 10/08/2019   MCV 87 10/08/2019   PLT 207 10/08/2019   Lab Results  Component Value Date   NA 141 10/08/2019   K 3.9 10/08/2019   CO2 23 10/08/2019   GLUCOSE 134 (H) 10/08/2019   BUN 13 10/08/2019   CREATININE 0.83 10/08/2019   BILITOT 0.9 10/08/2019   ALKPHOS 100 10/08/2019   AST 29 10/08/2019   ALT 21 10/08/2019   PROT 6.2 10/08/2019   ALBUMIN 3.7 10/08/2019   CALCIUM 9.3 10/08/2019   ANIONGAP 10 07/29/2019   Lab Results  Component Value Date   CHOL 143 10/08/2019   Lab Results  Component Value Date   HDL 49 10/08/2019   Lab Results  Component Value Date   LDLCALC 68 10/08/2019   Lab Results  Component Value Date   TRIG 149 10/08/2019   Lab Results  Component Value Date   CHOLHDL 2.9 10/08/2019   No results found for: HGBA1C    Assessment & Plan:   Problem List Items Addressed This Visit      Cardiovascular and Mediastinum   Hypertensive heart disease with heart failure ([place]) - Primary   Relevant Orders   CBC with Differential/Platelet   Comprehensive metabolic panel     Other   Mixed hyperlipidemia   Relevant Orders   Lipid panel   Vitamin D deficiency   Relevant Orders   VITAMIN D 25 Hydroxy (Vit-D Deficiency, Fractures)      No orders of the defined types were placed in this encounter.   Follow-up: Return in about 3 months (around 02/08/2020) for chronic fasting follow up.    SARA R Crisanto Nied, PA-C

## 2019-11-09 ENCOUNTER — Other Ambulatory Visit: Payer: Self-pay | Admitting: Physician Assistant

## 2019-11-09 DIAGNOSIS — R799 Abnormal finding of blood chemistry, unspecified: Secondary | ICD-10-CM

## 2019-11-09 LAB — CBC WITH DIFFERENTIAL/PLATELET
Basophils Absolute: 0 10*3/uL (ref 0.0–0.2)
Basos: 1 %
EOS (ABSOLUTE): 0.2 10*3/uL (ref 0.0–0.4)
Eos: 4 %
Hematocrit: 37.3 % (ref 34.0–46.6)
Hemoglobin: 11.6 g/dL (ref 11.1–15.9)
Immature Grans (Abs): 0 10*3/uL (ref 0.0–0.1)
Immature Granulocytes: 0 %
Lymphocytes Absolute: 0.9 10*3/uL (ref 0.7–3.1)
Lymphs: 16 %
MCH: 27 pg (ref 26.6–33.0)
MCHC: 31.1 g/dL — ABNORMAL LOW (ref 31.5–35.7)
MCV: 87 fL (ref 79–97)
Monocytes Absolute: 0.4 10*3/uL (ref 0.1–0.9)
Monocytes: 8 %
Neutrophils Absolute: 3.9 10*3/uL (ref 1.4–7.0)
Neutrophils: 71 %
Platelets: 194 10*3/uL (ref 150–450)
RBC: 4.29 x10E6/uL (ref 3.77–5.28)
RDW: 14.9 % (ref 11.7–15.4)
WBC: 5.4 10*3/uL (ref 3.4–10.8)

## 2019-11-09 LAB — COMPREHENSIVE METABOLIC PANEL
ALT: 17 IU/L (ref 0–32)
AST: 18 IU/L (ref 0–40)
Albumin/Globulin Ratio: 1.7 (ref 1.2–2.2)
Albumin: 3.8 g/dL (ref 3.7–4.7)
Alkaline Phosphatase: 100 IU/L (ref 48–121)
BUN/Creatinine Ratio: 17 (ref 12–28)
BUN: 14 mg/dL (ref 8–27)
Bilirubin Total: 0.6 mg/dL (ref 0.0–1.2)
CO2: 19 mmol/L — ABNORMAL LOW (ref 20–29)
Calcium: 9.3 mg/dL (ref 8.7–10.3)
Chloride: 107 mmol/L — ABNORMAL HIGH (ref 96–106)
Creatinine, Ser: 0.84 mg/dL (ref 0.57–1.00)
GFR calc Af Amer: 78 mL/min/{1.73_m2} (ref >59)
GFR calc non Af Amer: 67 mL/min/{1.73_m2} (ref >59)
Globulin, Total: 2.2 g/dL (ref 1.5–4.5)
Glucose: 143 mg/dL — ABNORMAL HIGH (ref 65–99)
Potassium: 4 mmol/L (ref 3.5–5.2)
Sodium: 144 mmol/L (ref 134–144)
Total Protein: 6 g/dL (ref 6.0–8.5)

## 2019-11-09 LAB — LIPID PANEL
Chol/HDL Ratio: 3.5 ratio (ref 0.0–4.4)
Cholesterol, Total: 145 mg/dL (ref 100–199)
HDL: 41 mg/dL (ref >39)
LDL Chol Calc (NIH): 79 mg/dL (ref 0–99)
Triglycerides: 143 mg/dL (ref 0–149)
VLDL Cholesterol Cal: 25 mg/dL (ref 5–40)

## 2019-11-09 LAB — VITAMIN D 25 HYDROXY (VIT D DEFICIENCY, FRACTURES): Vit D, 25-Hydroxy: 56 ng/mL (ref 30.0–100.0)

## 2019-11-09 LAB — CARDIOVASCULAR RISK ASSESSMENT

## 2019-11-11 LAB — SPECIMEN STATUS REPORT

## 2019-11-11 LAB — HGB A1C W/O EAG: Hgb A1c MFr Bld: 6.7 % — ABNORMAL HIGH (ref 4.8–5.6)

## 2019-12-29 ENCOUNTER — Other Ambulatory Visit: Payer: Self-pay

## 2019-12-30 MED ORDER — OMEPRAZOLE 20 MG PO CPDR: 20.0000 mg | DELAYED_RELEASE_CAPSULE | Freq: Every day | ORAL | 0 refills | Status: DC

## 2020-02-10 ENCOUNTER — Ambulatory Visit (INDEPENDENT_AMBULATORY_CARE_PROVIDER_SITE_OTHER): Payer: Medicare Other | Admitting: Physician Assistant

## 2020-02-10 ENCOUNTER — Other Ambulatory Visit: Payer: Self-pay

## 2020-02-10 ENCOUNTER — Encounter: Payer: Self-pay | Admitting: Physician Assistant

## 2020-02-10 VITALS — BP 108/62 | HR 66 | Temp 97.7°F | Wt 231.0 lb

## 2020-02-10 DIAGNOSIS — R7303 Prediabetes: Secondary | ICD-10-CM | POA: Insufficient documentation

## 2020-02-10 DIAGNOSIS — E559 Vitamin D deficiency, unspecified: Secondary | ICD-10-CM

## 2020-02-10 DIAGNOSIS — R5383 Other fatigue: Secondary | ICD-10-CM | POA: Insufficient documentation

## 2020-02-10 DIAGNOSIS — I25118 Atherosclerotic heart disease of native coronary artery with other forms of angina pectoris: Secondary | ICD-10-CM

## 2020-02-10 DIAGNOSIS — E782 Mixed hyperlipidemia: Secondary | ICD-10-CM

## 2020-02-10 DIAGNOSIS — I110 Hypertensive heart disease with heart failure: Secondary | ICD-10-CM

## 2020-02-10 DIAGNOSIS — E1169 Type 2 diabetes mellitus with other specified complication: Secondary | ICD-10-CM | POA: Insufficient documentation

## 2020-02-10 DIAGNOSIS — N958 Other specified menopausal and perimenopausal disorders: Secondary | ICD-10-CM

## 2020-02-10 HISTORY — DX: Other specified menopausal and perimenopausal disorders: N95.8

## 2020-02-10 HISTORY — DX: Other fatigue: R53.83

## 2020-02-10 HISTORY — DX: Prediabetes: R73.03

## 2020-02-10 MED ORDER — FUROSEMIDE 40 MG PO TABS: 40.0000 mg | ORAL_TABLET | Freq: Every day | ORAL | 1 refills | Status: DC | PRN

## 2020-02-10 NOTE — Assessment & Plan Note (Signed)
labwork pending 

## 2020-02-10 NOTE — Assessment & Plan Note (Signed)
dexa scan ordered.

## 2020-02-10 NOTE — Progress Notes (Signed)
Established Patient Office Visit  Subjective:  Patient ID: Miranda Moses, female    DOB: 06/11/1941  Age: 79 y.o. MRN: [id number]  CC:  No chief complaint on file.   HPI Chandrea Zellman presents for chronic follow up hyperlipidemia  Mixed hyperlipidemia  Pt presents with hyperlipidemia.  Compliance with treatment has been good; The patient is compliant with medications, maintains a low cholesterol diet , follows up as directed ,  . The patient denies experiencing any hypercholesterolemia related symptoms.  Evidenced based information based on history , exam, and other sources has been used  for decision making. Pt currently on atorvastatin 20mg  qd  Pt presents for follow up of hypertension.  The patient is tolerating the medication well without side effects. Compliance with treatment has been good; including taking medication as directed , maintains a healthy diet and regular exercise regimen , and following up as directed. Patient was evaluated using exam, physical, labs and other information to perform evidence based decision making. Pt currently on metoprolol 25mg  bid   Pt has a history of chronic diastolic CHF - she is trying to do a low sodium diet and currently on lasix 40mg  daily as needed  Pt with history of afib - is currently following with cardiology and has appt with Dr [name] in august - she is currently on Eliquis 5mg  bid (also has history of PE in 07/2019) and digoxin 0.125mg  qd  Pt is due for dexascan - will schedule   Past Medical History:  Diagnosis Date  . Angina pectoris ([place]) 02/18/2018  . CAD in native artery    by CT coronary 10/2018 >> medical therapy   . CHF (congestive heart failure), NYHA class I, chronic, diastolic ([place]) 12/18/9483  . Chronic diastolic (congestive) heart failure ([place])   . Coronary artery disease of native artery of native heart with stable angina pectoris ([place]) 08/09/2019  . COVID-19 virus infection   . Family history of coronary artery disease  01/30/2018  . Gastro-esophageal reflux disease without esophagitis 01/30/2018  . GERD (gastroesophageal reflux disease)   . History of pulmonary embolism 08/23/2019  . Hyperlipidemia   . Hypertension   . Hypertensive heart disease with heart failure ([place]) 01/30/2018  . Mixed hyperlipidemia 02/18/2018  . New onset atrial fibrillation ([place])   . PAD (peripheral artery disease) ([place]) 08/28/2018  . PAH (pulmonary artery hypertension) ([place]) 08/28/2018  . Pre-diabetes   . Pulmonary embolus ([place])   . Pulmonary hypertension ([place])   . Vitamin D deficiency 08/09/2019    Past Surgical History:  Procedure Laterality Date  . ABDOMINAL HYSTERECTOMY    . CATARACT EXTRACTION Left 03/2014  . CATARACT EXTRACTION Right 05/2014  . COLONOSCOPY  11/05/2013    Family History  Problem Relation Age of Onset  . Heart disease Father     Social History   Socioeconomic History  . Marital status: Widowed    Spouse name: Not on file  . Number of children: 1  . Years of education: Not on file  . Highest education level: Not on file  Occupational History  . Occupation: retired  Tobacco Use  . Smoking status: Never Smoker  . Smokeless tobacco: Never Used  Vaping Use  . Vaping Use: Never used  Substance and Sexual Activity  . Alcohol use: Never  . Drug use: Never  . Sexual activity: Not on file  Other Topics Concern  . Not on file  Social History Narrative  . Not on file   Social Determinants of  Health   Financial Resource Strain:   . Difficulty of Paying Living Expenses: Not on file  Food Insecurity:   . Worried About Charity fundraiser in the Last Year: Not on file  . Ran Out of Food in the Last Year: Not on file  Transportation Needs:   . Lack of Transportation (Medical): Not on file  . Lack of Transportation (Non-Medical): Not on file  Physical Activity:   . Days of Exercise per Week: Not on file  . Minutes of Exercise per Session: Not on file  Stress:   . Feeling of Stress : Not on file   Social Connections:   . Frequency of Communication with Friends and Family: Not on file  . Frequency of Social Gatherings with Friends and Family: Not on file  . Attends Religious Services: Not on file  . Active Member of Clubs or Organizations: Not on file  . Attends Archivist Meetings: Not on file  . Marital Status: Not on file  Intimate Partner Violence:   . Fear of Current or Ex-Partner: Not on file  . Emotionally Abused: Not on file  . Physically Abused: Not on file  . Sexually Abused: Not on file     Current Outpatient Medications:  .  apixaban (ELIQUIS) 5 MG TABS tablet, Ttake 1 tablet twice daily, Disp: 60 tablet, Rfl: 2 .  aspirin EC 81 MG tablet, Take 81 mg by mouth daily., Disp: , Rfl:  .  atorvastatin (LIPITOR) 20 MG tablet, TAKE 1 TABLET(20 MG) BY MOUTH EVERY DAY, Disp: 90 tablet, Rfl: 1 .  Cholecalciferol (VITAMIN D3 PO), Take 2 capsules by mouth daily., Disp: , Rfl:  .  digoxin (LANOXIN) 0.125 MG tablet, Take 1 tablet (0.125 mg total) by mouth daily., Disp: 90 tablet, Rfl: 1 .  furosemide (LASIX) 40 MG tablet, Take 1 tablet (40 mg total) by mouth daily as needed for edema., Disp: 90 tablet, Rfl: 1 .  isosorbide mononitrate (IMDUR) 30 MG 24 hr tablet, TAKE 1 TABLET(30 MG) BY MOUTH DAILY, Disp: 90 tablet, Rfl: 2 .  metoprolol tartrate (LOPRESSOR) 25 MG tablet, Take 1 tablet (25 mg total) by mouth 2 (two) times daily., Disp: 180 tablet, Rfl: 1 .  Multiple Vitamin (MULTIVITAMIN WITH MINERALS) TABS tablet, Take 1 tablet by mouth daily., Disp: , Rfl:  .  omeprazole (PRILOSEC) 20 MG capsule, Take 1 capsule (20 mg total) by mouth daily., Disp: 180 capsule, Rfl: 0 .  triamcinolone (NASACORT ALLERGY 24HR) 55 MCG/ACT AERO nasal inhaler, Place 1 spray into the nose daily as needed (allergies)., Disp: , Rfl:  .  ZINC-VITAMIN C PO, Take 2 tablets by mouth daily., Disp: , Rfl:    Allergies  Allergen Reactions  . Clarithromycin     Numbness of extremities  . Codeine  Other (See Comments)    Caused pain  . Penicillins Rash    Did it involve swelling of the face/tongue/throat, SOB, or low BP? No Did it involve sudden or severe rash/hives, skin peeling, or any reaction on the inside of your mouth or nose? Yes Did you need to seek medical attention at a hospital or doctor's office? Yes When did it last happen?20 years If all above answers are "NO", may proceed with cephalosporin use.     ROS CONSTITUTIONAL: Negative for chills,  fever, unintentional weight gain and unintentional weight loss. Occasional mild fatigue E/N/T: Negative for ear pain, nasal congestion and sore throat.  CARDIOVASCULAR: Negative for chest pain, dizziness, palpitations and  mild edema of extremities  RESPIRATORY: Negative for recent cough and dyspnea.  GASTROINTESTINAL: Negative for abdominal pain, acid reflux symptoms, constipation, diarrhea, nausea and vomiting.  MSK: Negative for arthralgias and myalgias.  INTEGUMENTARY: Negative for rash.  NEUROLOGICAL: Negative for dizziness and headaches.  PSYCHIATRIC: Negative for sleep disturbance and to question depression screen.  Negative for depression, negative for anhedonia.        Objective:    PHYSICAL EXAM:   VS: BP 108/62 (BP Location: Left Arm, Patient Position: Sitting)   Pulse 66   Temp 97.7 F (36.5 C) (Temporal)   Wt 231 lb (104.8 kg)   SpO2 98%   BMI 36.18 kg/m   GEN: Well nourished, well developed, in no acute distress  Cardiac: RRR; no murmurs, rubs, or gallops,no edema - no significant varicosities Respiratory:  normal respiratory rate and pattern with no distress - normal breath sounds with no rales, rhonchi, wheezes or rubs MS: no deformity or atrophy  Skin: warm and dry, no rash  Neuro:  Alert and Oriented x 3, Strength and sensation are intact - CN II-Xii grossly intact Psych: euthymic mood, appropriate affect and demeanor      Health Maintenance Due  Topic Date Due  . Hepatitis C Screening   Never done  . TETANUS/TDAP  Never done  . DEXA SCAN  Never done  . INFLUENZA VACCINE  01/23/2020    There are no preventive care reminders to display for this patient.  Lab Results  Component Value Date   TSH 1.360 10/08/2019   Lab Results  Component Value Date   WBC 5.4 11/08/2019   HGB 11.6 11/08/2019   HCT 37.3 11/08/2019   MCV 87 11/08/2019   PLT 194 11/08/2019   Lab Results  Component Value Date   NA 144 11/08/2019   K 4.0 11/08/2019   CO2 19 (L) 11/08/2019   GLUCOSE 143 (H) 11/08/2019   BUN 14 11/08/2019   CREATININE 0.84 11/08/2019   BILITOT 0.6 11/08/2019   ALKPHOS 100 11/08/2019   AST 18 11/08/2019   ALT 17 11/08/2019   PROT 6.0 11/08/2019   ALBUMIN 3.8 11/08/2019   CALCIUM 9.3 11/08/2019   ANIONGAP 10 07/29/2019   Lab Results  Component Value Date   CHOL 145 11/08/2019   Lab Results  Component Value Date   HDL 41 11/08/2019   Lab Results  Component Value Date   LDLCALC 79 11/08/2019   Lab Results  Component Value Date   TRIG 143 11/08/2019   Lab Results  Component Value Date   CHOLHDL 3.5 11/08/2019   Lab Results  Component Value Date   HGBA1C 6.7 (H) 11/08/2019      Assessment & Plan:   Problem List Items Addressed This Visit      Cardiovascular and Mediastinum   Hypertensive heart disease with heart failure ([facility]) - Primary    Well controlled.  No changes to medicines.  Continue to work on eating a healthy diet and exercise.  Labs drawn today.  Follow up with cardiology as directed      Relevant Medications   furosemide (LASIX) 40 MG tablet   Other Relevant Orders   CBC with Differential/Platelet   Comprehensive metabolic panel   TSH   Coronary artery disease of native artery of native heart with stable angina pectoris ([place])    Follow up with cardiology as directed      Relevant Medications   furosemide (LASIX) 40 MG tablet     Other  Mixed hyperlipidemia    Well controlled.  No changes to medicines.  Continue  to work on eating a healthy diet and exercise.  Labs drawn today.        Relevant Medications   furosemide (LASIX) 40 MG tablet   Other Relevant Orders   Lipid panel   Vitamin D deficiency    labwork pending      Relevant Orders   VITAMIN D 25 Hydroxy (Vit-D Deficiency, Fractures)   Prediabetes    labwork pending      Relevant Orders   Hemoglobin A1c   Other fatigue    labwork pending      Relevant Orders   TSH   Other specified menopausal and perimenopausal disorders    dexa scan ordered         Meds ordered this encounter  Medications  . furosemide (LASIX) 40 MG tablet    Sig: Take 1 tablet (40 mg total) by mouth daily as needed for edema.    Dispense:  90 tablet    Refill:  1    Order Specific Question:   Supervising Provider    Answer:   Shelton Silvas    Follow-up: Return in about 6 months (around 08/12/2020) for chronic fasting follow up.    SARA R Jhonatan Lomeli, PA-C

## 2020-02-10 NOTE — Assessment & Plan Note (Signed)
Follow up with cardiology as directed

## 2020-02-10 NOTE — Assessment & Plan Note (Signed)
Well controlled.  ?No changes to medicines.  ?Continue to work on eating a healthy diet and exercise.  ?Labs drawn today.  ?

## 2020-02-10 NOTE — Assessment & Plan Note (Signed)
Well controlled.  °No changes to medicines.  °Continue to work on eating a healthy diet and exercise.  °Labs drawn today.  °Follow up with cardiology as directed °

## 2020-02-11 LAB — COMPREHENSIVE METABOLIC PANEL
ALT: 14 IU/L (ref 0–32)
AST: 13 IU/L (ref 0–40)
Albumin/Globulin Ratio: 1.6 (ref 1.2–2.2)
Albumin: 3.8 g/dL (ref 3.7–4.7)
Alkaline Phosphatase: 111 IU/L (ref 48–121)
BUN/Creatinine Ratio: 16 (ref 12–28)
BUN: 15 mg/dL (ref 8–27)
Bilirubin Total: 0.7 mg/dL (ref 0.0–1.2)
CO2: 22 mmol/L (ref 20–29)
Calcium: 9.1 mg/dL (ref 8.7–10.3)
Chloride: 104 mmol/L (ref 96–106)
Creatinine, Ser: 0.95 mg/dL (ref 0.57–1.00)
GFR calc Af Amer: 67 mL/min/{1.73_m2} (ref >59)
GFR calc non Af Amer: 58 mL/min/{1.73_m2} — ABNORMAL LOW (ref >59)
Globulin, Total: 2.4 g/dL (ref 1.5–4.5)
Glucose: 147 mg/dL — ABNORMAL HIGH (ref 65–99)
Potassium: 4.1 mmol/L (ref 3.5–5.2)
Sodium: 139 mmol/L (ref 134–144)
Total Protein: 6.2 g/dL (ref 6.0–8.5)

## 2020-02-11 LAB — CBC WITH DIFFERENTIAL/PLATELET
Basophils Absolute: 0.1 10*3/uL (ref 0.0–0.2)
Basos: 1 %
EOS (ABSOLUTE): 0.1 10*3/uL (ref 0.0–0.4)
Eos: 3 %
Hematocrit: 37.8 % (ref 34.0–46.6)
Hemoglobin: 11.7 g/dL (ref 11.1–15.9)
Immature Grans (Abs): 0 10*3/uL (ref 0.0–0.1)
Immature Granulocytes: 0 %
Lymphocytes Absolute: 1.1 10*3/uL (ref 0.7–3.1)
Lymphs: 24 %
MCH: 26.2 pg — ABNORMAL LOW (ref 26.6–33.0)
MCHC: 31 g/dL — ABNORMAL LOW (ref 31.5–35.7)
MCV: 85 fL (ref 79–97)
Monocytes Absolute: 0.4 10*3/uL (ref 0.1–0.9)
Monocytes: 9 %
Neutrophils Absolute: 3 10*3/uL (ref 1.4–7.0)
Neutrophils: 63 %
Platelets: 190 10*3/uL (ref 150–450)
RBC: 4.46 x10E6/uL (ref 3.77–5.28)
RDW: 15.1 % (ref 11.7–15.4)
WBC: 4.8 10*3/uL (ref 3.4–10.8)

## 2020-02-11 LAB — HEMOGLOBIN A1C
Est. average glucose Bld gHb Est-mCnc: 157 mg/dL
Hgb A1c MFr Bld: 7.1 % — ABNORMAL HIGH (ref 4.8–5.6)

## 2020-02-11 LAB — LIPID PANEL
Chol/HDL Ratio: 3.8 ratio (ref 0.0–4.4)
Cholesterol, Total: 147 mg/dL (ref 100–199)
HDL: 39 mg/dL — ABNORMAL LOW (ref >39)
LDL Chol Calc (NIH): 86 mg/dL (ref 0–99)
Triglycerides: 122 mg/dL (ref 0–149)
VLDL Cholesterol Cal: 22 mg/dL (ref 5–40)

## 2020-02-11 LAB — VITAMIN D 25 HYDROXY (VIT D DEFICIENCY, FRACTURES): Vit D, 25-Hydroxy: 51.7 ng/mL (ref 30.0–100.0)

## 2020-02-11 LAB — CARDIOVASCULAR RISK ASSESSMENT

## 2020-02-11 LAB — TSH: TSH: 1.6 u[IU]/mL (ref 0.450–4.500)

## 2020-02-14 DIAGNOSIS — R7303 Prediabetes: Secondary | ICD-10-CM | POA: Insufficient documentation

## 2020-02-14 DIAGNOSIS — I272 Pulmonary hypertension, unspecified: Secondary | ICD-10-CM | POA: Insufficient documentation

## 2020-02-14 DIAGNOSIS — I10 Essential (primary) hypertension: Secondary | ICD-10-CM | POA: Insufficient documentation

## 2020-02-14 DIAGNOSIS — E785 Hyperlipidemia, unspecified: Secondary | ICD-10-CM | POA: Insufficient documentation

## 2020-02-14 DIAGNOSIS — K219 Gastro-esophageal reflux disease without esophagitis: Secondary | ICD-10-CM | POA: Insufficient documentation

## 2020-02-14 DIAGNOSIS — I5032 Chronic diastolic (congestive) heart failure: Secondary | ICD-10-CM | POA: Insufficient documentation

## 2020-02-14 DIAGNOSIS — I2510 Atherosclerotic heart disease of native coronary artery without angina pectoris: Secondary | ICD-10-CM | POA: Insufficient documentation

## 2020-02-15 NOTE — Progress Notes (Signed)
Cardiology Office Note:    Date:  02/16/2020   ID:  Ree Moses, DOB [birth year], MRN [id number]  PCP:  Miranda Duncans, PA-C  Cardiologist:  Miranda More, MD    Referring MD: Miranda Duncans, PA-C    ASSESSMENT:    1. Coronary artery disease of native artery of native heart with stable angina pectoris ([place])   2. New onset atrial fibrillation ([place])   3. High risk medication use   4. Chronic anticoagulation   5. Hypertensive heart disease with heart failure ([place])   6. Mixed hyperlipidemia   7. Abnormal ankle brachial index (ABI)    PLAN:    In order of problems listed above:  1. Overall she is doing quite well stable CAD having no angina on medical therapy including low-dose aspirin and apixaban beta-blocker oral nitrate and statin.  At this time I would continue medical therapy. 2. Stable rate controlled on beta-blocker I think is a good opportunity to discontinue digoxin. 3. Continue her anticoagulant low-dose aspirin 4. Blood pressure target continue current antihypertensive and loop diuretic no evidence of decompensated heart failure recent proBNP level is good 5. Continue high intensity statin lipids are at target 6. Stable not having claudication   Next appointment: I will plan to see her back in the office in 1 year and asked her to contact me if I need to see her in the interim.   Medication Adjustments/Labs and Tests Ordered: Current medicines are reviewed at length with the patient today.  Concerns regarding medicines are outlined above.  No orders of the defined types were placed in this encounter.  No orders of the defined types were placed in this encounter.   Chief Complaint  Patient presents with  . Follow-up  . Coronary Artery Disease    History of Present Illness:    Miranda Moses is a 79 y.o. female with a hx of chest pain hypertensive heart disease with heart failure atrial fibrillation anticoagulated on digoxin and hyperlipidemia.  She was last seen 04/22/2019  and is being treated medically for her CAD including aspirin beta-blocker and lipid-lowering therapy.  She is poorly statin intolerant.  Her LDL was severely elevated on pravastatin at 135.  Compliance with diet, lifestyle and medications: Yes  She is doing much better she is not short of breath has not had any recent palpitation and has had no chest pain edema or syncope.  She tolerates her medications including anticoagulant without bleeding statin without muscle pain or weakness and is on digoxin with a normal ejection fraction fortunately is not toxic him and tell her I think she should stop it at this time but I will do an office EKG prior to that.  Her lipid profile is improved on atorvastatin with LDL at target renal function recently was normal.  I reviewed her testing with her including:   Echocardiogram performed 10/14/2018 showed ejection fraction 60 to 65% normal right ventricular size and function moderate left atrial enlargement and pulmonary hypertension with a estimated peak pulmonary systolic pressure 65 mmHg.  Left ventricular diastolic dysfunction was seen with pseudonormalization consistent with diastolic heart failure.   Lower extremity ABI was normal in the right lower extremity and mildly reduced in the left lower extremity.   Cardiac CTA 11/06/2018 showed the pulmonary arteries being normal size.  The left main coronary artery mild 25 to 50% stenosis.  The left anterior descending coronary artery proximally had a 50 to 69% stenosis with normal FFR in the mid and distal  vessel had diffuse disease.  Left circumflex coronary artery had moderate proximal 50 to 69% stenosis in the right coronary artery had a severe calcific plaque in the ostial right coronary artery extending into the proximal portion with stenosis greater than 70%.  The study was sent for [facility] which showed diminished flow in the distal left circumflex only  Past Medical History:  Diagnosis Date  . Angina pectoris  ([place]) 02/18/2018  . CAD in native artery    by CT coronary 10/2018 >> medical therapy   . CHF (congestive heart failure), NYHA class I, chronic, diastolic ([place]) 7/61/6073  . Chronic diastolic (congestive) heart failure ([place])   . Coronary artery disease of native artery of native heart with stable angina pectoris ([place]) 08/09/2019  . COVID-19 virus infection   . Family history of coronary artery disease 01/30/2018  . Gastro-esophageal reflux disease without esophagitis 01/30/2018  . GERD (gastroesophageal reflux disease)   . History of pulmonary embolism 08/23/2019  . Hyperlipidemia   . Hypertension   . Hypertensive heart disease with heart failure ([place]) 01/30/2018  . Mixed hyperlipidemia 02/18/2018  . New onset atrial fibrillation ([employer])   . PAD (peripheral artery disease) ([place]) 08/28/2018  . PAH (pulmonary artery hypertension) ([place]) 08/28/2018  . Pre-diabetes   . Pulmonary embolus ([place])   . Pulmonary hypertension ([place])   . Vitamin D deficiency 08/09/2019    Past Surgical History:  Procedure Laterality Date  . ABDOMINAL HYSTERECTOMY    . CATARACT EXTRACTION Left 03/2014  . CATARACT EXTRACTION Right 05/2014  . COLONOSCOPY  11/05/2013    Current Medications: Current Meds  Medication Sig  . apixaban (ELIQUIS) 5 MG TABS tablet Ttake 1 tablet twice daily  . aspirin EC 81 MG tablet Take 81 mg by mouth daily.  Marland Kitchen atorvastatin (LIPITOR) 20 MG tablet TAKE 1 TABLET(20 MG) BY MOUTH EVERY DAY  . Cholecalciferol (VITAMIN D3 PO) Take 2 capsules by mouth daily.  . digoxin (LANOXIN) 0.125 MG tablet Take 1 tablet (0.125 mg total) by mouth daily.  . furosemide (LASIX) 40 MG tablet Take 1 tablet (40 mg total) by mouth daily as needed for edema.  . isosorbide mononitrate (IMDUR) 30 MG 24 hr tablet TAKE 1 TABLET(30 MG) BY MOUTH DAILY  . metoprolol tartrate (LOPRESSOR) 25 MG tablet Take 1 tablet (25 mg total) by mouth 2 (two) times daily.  . Multiple Vitamin (MULTIVITAMIN WITH MINERALS) TABS tablet Take 1 tablet by  mouth daily.  Marland Kitchen omeprazole (PRILOSEC) 20 MG capsule Take 1 capsule (20 mg total) by mouth daily.  Marland Kitchen triamcinolone (NASACORT ALLERGY 24HR) 55 MCG/ACT AERO nasal inhaler Place 1 spray into the nose daily as needed (allergies).  Marland Kitchen ZINC-VITAMIN C PO Take 2 tablets by mouth daily.     Allergies:   Clarithromycin, Codeine, and Penicillins   Social History   Socioeconomic History  . Marital status: Widowed    Spouse name: Not on file  . Number of children: 1  . Years of education: Not on file  . Highest education level: Not on file  Occupational History  . Occupation: retired  Tobacco Use  . Smoking status: Never Smoker  . Smokeless tobacco: Never Used  Vaping Use  . Vaping Use: Never used  Substance and Sexual Activity  . Alcohol use: Never  . Drug use: Never  . Sexual activity: Not on file  Other Topics Concern  . Not on file  Social History Narrative  . Not on file   Social Determinants of Health  Financial Resource Strain:   . Difficulty of Paying Living Expenses: Not on file  Food Insecurity:   . Worried About Charity fundraiser in the Last Year: Not on file  . Ran Out of Food in the Last Year: Not on file  Transportation Needs:   . Lack of Transportation (Medical): Not on file  . Lack of Transportation (Non-Medical): Not on file  Physical Activity:   . Days of Exercise per Week: Not on file  . Minutes of Exercise per Session: Not on file  Stress:   . Feeling of Stress : Not on file  Social Connections:   . Frequency of Communication with Friends and Family: Not on file  . Frequency of Social Gatherings with Friends and Family: Not on file  . Attends Religious Services: Not on file  . Active Member of Clubs or Organizations: Not on file  . Attends Archivist Meetings: Not on file  . Marital Status: Not on file     Family History: The patient's family history includes Heart disease in her father. ROS:   Please see the history of present illness.      All other systems reviewed and are negative.  EKGs/Labs/Other Studies Reviewed:    The following studies were reviewed today:  EKG:  EKG ordered today and personally reviewed.  The ekg ordered today demonstrates atrial fibrillation controlled ventricular rate  Recent Labs: 07/26/2019: B Natriuretic Peptide 633.2 07/27/2019: Magnesium 1.9 02/10/2020: ALT 14; BUN 15; Creatinine, Ser 0.95; Hemoglobin 11.7; Platelets 190; Potassium 4.1; Sodium 139; TSH 1.600  Recent Lipid Panel    Component Value Date/Time   CHOL 147 02/10/2020 0957   TRIG 122 02/10/2020 0957   HDL 39 (L) 02/10/2020 0957   CHOLHDL 3.8 02/10/2020 0957   LDLCALC 86 02/10/2020 0957    Physical Exam:    VS:  BP (!) 108/52   Pulse 60   Ht 5\' 5"  (1.651 m)   Wt 234 lb (106.1 kg)   SpO2 95%   BMI 38.94 kg/m     Wt Readings from Last 3 Encounters:  02/16/20 234 lb (106.1 kg)  02/10/20 231 lb (104.8 kg)  11/08/19 240 lb (108.9 kg)     GEN:  Well nourished, well developed in no acute distress HEENT: Normal NECK: No JVD; No carotid bruits LYMPHATICS: No lymphadenopathy CARDIAC: RRR, no murmurs, rubs, gallops RESPIRATORY:  Clear to auscultation without rales, wheezing or rhonchi  ABDOMEN: Soft, non-tender, non-distended MUSCULOSKELETAL:  No edema; No deformity  SKIN: Warm and dry NEUROLOGIC:  Alert and oriented x 3 PSYCHIATRIC:  Normal affect    Signed, Miranda More, MD  02/16/2020 9:42 AM    [place]

## 2020-02-16 ENCOUNTER — Other Ambulatory Visit: Payer: Self-pay

## 2020-02-16 ENCOUNTER — Ambulatory Visit (INDEPENDENT_AMBULATORY_CARE_PROVIDER_SITE_OTHER): Payer: Medicare Other | Admitting: Cardiology

## 2020-02-16 ENCOUNTER — Encounter: Payer: Self-pay | Admitting: Cardiology

## 2020-02-16 VITALS — BP 108/52 | HR 60 | Ht 65.0 in | Wt 234.0 lb

## 2020-02-16 DIAGNOSIS — Z79899 Other long term (current) drug therapy: Secondary | ICD-10-CM | POA: Diagnosis not present

## 2020-02-16 DIAGNOSIS — Z7901 Long term (current) use of anticoagulants: Secondary | ICD-10-CM

## 2020-02-16 DIAGNOSIS — R6889 Other general symptoms and signs: Secondary | ICD-10-CM | POA: Diagnosis not present

## 2020-02-16 DIAGNOSIS — I4891 Unspecified atrial fibrillation: Secondary | ICD-10-CM

## 2020-02-16 DIAGNOSIS — I25118 Atherosclerotic heart disease of native coronary artery with other forms of angina pectoris: Secondary | ICD-10-CM

## 2020-02-16 DIAGNOSIS — E782 Mixed hyperlipidemia: Secondary | ICD-10-CM | POA: Diagnosis not present

## 2020-02-16 DIAGNOSIS — I110 Hypertensive heart disease with heart failure: Secondary | ICD-10-CM | POA: Diagnosis not present

## 2020-02-16 NOTE — Patient Instructions (Signed)
Medication Instructions:  Your physician has recommended you make the following change in your medication:  STOP: Digoxin *If you need a refill on your cardiac medications before your next appointment, please call your pharmacy*   Lab Work: None If you have labs (blood work) drawn today and your tests are completely normal, you will receive your results only by: Marland Kitchen MyChart Message (if you have MyChart) OR . A paper copy in the mail If you have any lab test that is abnormal or we need to change your treatment, we will call you to review the results.   Testing/Procedures: None   Follow-Up: At [facility], you and your health needs are our priority.  As part of our continuing mission to provide you with exceptional heart care, we have created designated Provider Care Teams.  These Care Teams include your primary Cardiologist (physician) and Advanced Practice Providers (APPs -  Physician Assistants and Nurse Practitioners) who all work together to provide you with the care you need, when you need it.  We recommend signing up for the patient portal called "MyChart".  Sign up information is provided on this After Visit Summary.  MyChart is used to connect with patients for Virtual Visits (Telemedicine).  Patients are able to view lab/test results, encounter notes, upcoming appointments, etc.  Non-urgent messages can be sent to your provider as well.   To learn more about what you can do with MyChart, go to [email or web address].    Your next appointment:   1 year(s)  The format for your next appointment:   In Person  Provider:   [name], MD   Other Instructions

## 2020-02-25 ENCOUNTER — Encounter: Payer: Self-pay | Admitting: Physician Assistant

## 2020-02-25 DIAGNOSIS — N958 Other specified menopausal and perimenopausal disorders: Secondary | ICD-10-CM | POA: Diagnosis not present

## 2020-02-25 DIAGNOSIS — M8589 Other specified disorders of bone density and structure, multiple sites: Secondary | ICD-10-CM | POA: Diagnosis not present

## 2020-02-25 DIAGNOSIS — Z1231 Encounter for screening mammogram for malignant neoplasm of breast: Secondary | ICD-10-CM | POA: Diagnosis not present

## 2020-03-05 ENCOUNTER — Other Ambulatory Visit: Payer: Self-pay | Admitting: Physician Assistant

## 2020-03-23 ENCOUNTER — Encounter: Payer: Self-pay | Admitting: Physician Assistant

## 2020-03-23 ENCOUNTER — Other Ambulatory Visit: Payer: Self-pay

## 2020-03-23 ENCOUNTER — Ambulatory Visit (INDEPENDENT_AMBULATORY_CARE_PROVIDER_SITE_OTHER): Payer: Medicare Other

## 2020-03-23 DIAGNOSIS — Z23 Encounter for immunization: Secondary | ICD-10-CM

## 2020-04-03 ENCOUNTER — Telehealth: Payer: Self-pay | Admitting: Cardiology

## 2020-04-03 MED ORDER — METOPROLOL TARTRATE 25 MG PO TABS: 25.0000 mg | ORAL_TABLET | Freq: Two times a day (BID) | ORAL | 1 refills | Status: DC

## 2020-04-03 NOTE — Telephone Encounter (Signed)
Medication filled.  

## 2020-04-03 NOTE — Telephone Encounter (Signed)
*  STAT* If patient is at the pharmacy, call can be transferred to refill team.   1. Which medications need to be refilled? (please list name of each medication and dose if known) metoprolol tartrate (LOPRESSOR) 25 MG tablet(Expired)  2. Which pharmacy/location (including street and city if local pharmacy) is medication to be sent to? [facility] [place], [place]  3. Do they need a 30 day or 90 day supply? Miranda Moses

## 2020-04-26 ENCOUNTER — Telehealth: Payer: Self-pay | Admitting: Physician Assistant

## 2020-04-26 NOTE — Chronic Care Management (AMB) (Signed)
  Chronic Care Management   Note  04/26/2020 Name: Miranda Moses MRN: [id number] DOB: 14-Feb-1941  [name] is a 79 y.o. year old female who is a primary care patient of [name], Hershal Coria. I reached out to [facility] by phone today in response to a referral sent by [name] PCP, [name], PA-C.   [name] was given information about Chronic Care Management services today including:  1. CCM service includes personalized support from designated clinical staff supervised by her physician, including individualized plan of care and coordination with other care providers 2. 24/7 contact phone numbers for assistance for urgent and routine care needs. 3. Service will only be billed when office clinical staff spend 20 minutes or more in a month to coordinate care. 4. Only one practitioner may furnish and bill the service in a calendar month. 5. The patient may stop CCM services at any time (effective at the end of the month) by phone call to the office staff.   Patient agreed to services and verbal consent obtained.   Follow up plan:   [place]

## 2020-05-16 ENCOUNTER — Other Ambulatory Visit: Payer: Self-pay

## 2020-05-16 NOTE — Telephone Encounter (Signed)
Pt Called to get Samples of Eliquis 5 mg. Samples was giving.

## 2020-05-31 ENCOUNTER — Telehealth: Payer: Self-pay

## 2020-05-31 NOTE — Chronic Care Management (AMB) (Signed)
Chronic Care Management Pharmacy Assistant   Name: Miranda Moses  MRN: [id number] DOB: 03/28/1941  Reason for Encounter: Medication Review/ Initial Questions for Initial Pharmacist Visit for 06/06/2020.  Have you seen any other providers since your last visit?  02/10/2020 Miranda Moses- Follow up of Hypertension, Hyperlipidemia, Pre-Diabetes and Congestive Heart Failure. Follow up in 6 months.  02/16/2020- Miranda More, MD- Cardiologist-Follow up on CAD and new onset of A-fib. EKG done. STOP Digoxin. Follow up in 1 year.  Any changes in your medications or health? Digoxin STOPPED 02/16/2020 by Dr. Shirlee Moses. New diagnosis of A-Fib.       PCP : Miranda Duncans, PA-C  Allergies:   Allergies  Allergen Reactions  . Clarithromycin     Numbness of extremities  . Codeine Other (See Comments)    Caused pain  . Penicillins Rash    Did it involve swelling of the face/tongue/throat, SOB, or low BP? No Did it involve sudden or severe rash/hives, skin peeling, or any reaction on the inside of your mouth or nose? Yes Did you need to seek medical attention at a hospital or doctor's office? Yes When did it last happen?20 years If all above answers are "NO", may proceed with cephalosporin use.     Medications: Outpatient Encounter Medications as of 05/31/2020  Medication Sig  . apixaban (ELIQUIS) 5 MG TABS tablet Ttake 1 tablet twice daily  . aspirin EC 81 MG tablet Take 81 mg by mouth daily.  Marland Kitchen atorvastatin (LIPITOR) 20 MG tablet TAKE 1 TABLET(20 MG) BY MOUTH EVERY DAY  . Cholecalciferol (VITAMIN D3 PO) Take 2 capsules by mouth daily.  . furosemide (LASIX) 40 MG tablet Take 1 tablet (40 mg total) by mouth daily as needed for edema.  . isosorbide mononitrate (IMDUR) 30 MG 24 hr tablet TAKE 1 TABLET(30 MG) BY MOUTH DAILY  . metoprolol tartrate (LOPRESSOR) 25 MG tablet Take 1 tablet (25 mg total) by mouth 2 (two) times daily.  . Multiple Vitamin (MULTIVITAMIN WITH MINERALS) TABS  tablet Take 1 tablet by mouth daily.  Marland Kitchen omeprazole (PRILOSEC) 20 MG capsule Take 1 capsule (20 mg total) by mouth daily.  Marland Kitchen triamcinolone (NASACORT ALLERGY 24HR) 55 MCG/ACT AERO nasal inhaler Place 1 spray into the nose daily as needed (allergies).  Marland Kitchen ZINC-VITAMIN C PO Take 2 tablets by mouth daily.   No facility-administered encounter medications on file as of 05/31/2020.    Current Diagnosis: Patient Active Problem List   Diagnosis Date Noted  . Pulmonary hypertension ([place])   . Pre-diabetes   . Hypertension   . Hyperlipidemia   . GERD (gastroesophageal reflux disease)   . Chronic diastolic (congestive) heart failure ([place])   . CAD in native artery   . Prediabetes 02/10/2020  . Other fatigue 02/10/2020  . Other specified menopausal and perimenopausal disorders 02/10/2020  . History of pulmonary embolism 08/23/2019  . CHF (congestive heart failure), NYHA class I, chronic, diastolic ([place]) 81/27/5170  . Vitamin D deficiency 08/09/2019  . Coronary artery disease of native artery of native heart with stable angina pectoris ([place]) 08/09/2019  . New onset atrial fibrillation ([place])   . Pulmonary embolus ([place])   . COVID-19 virus infection   . PAH (pulmonary artery hypertension) ([place]) 08/28/2018  . PAD (peripheral artery disease) ([place]) 08/28/2018  . Angina pectoris ([place]) 02/18/2018  . Mixed hyperlipidemia 02/18/2018  . Family history of coronary artery disease 01/30/2018  . Hypertensive heart disease with heart failure ([place]) 01/30/2018  . Gastro-esophageal reflux disease  without esophagitis 01/30/2018    Follow-Up:  Pharmacist Review Called patient for Initial Questions, unable to reach patient.  Reviewed chart and adherence measures .Per insurance data . Total gaps -all measures are (3) 1. Wellness Bundle not performed  2. Annual Wellness Visit - not performed 3. Preventive care and screening for clinical depression and follow up plan not met.  Sherre Poot CPP  notified  [name], [place] Pharmacist Assistant [phone]

## 2020-06-05 NOTE — Chronic Care Management (AMB) (Signed)
Chronic Care Management Pharmacy  Name: Miranda Moses  MRN: [id number] DOB: 1941/05/04  Chief Complaint/ HPI  Miranda Moses,  79 y.o. , female presents for their Initial CCM visit with the clinical pharmacist via telephone due to COVID-19 Pandemic.  PCP : Miranda Duncans, PA-C  Their chronic conditions include: hypertensive heart disease with heart failure, PAH, PAD, PE, GERD, mixed hyperlipidemia, Vitamin D deficiency, prediabetes.   Office Visits: 03/23/2020 - flu shot given.  02/10/2020 - labs drawn. No changes to medications. [place] to work on eating a healthy diet and exercise.  Consult Visit: 02/16/2020 - Cardiology - stable CAD. Stable rate controlled on beta blocker. Discontinue digoxin.  Medications: Outpatient Encounter Medications as of 06/06/2020  Medication Sig  . apixaban (ELIQUIS) 5 MG TABS tablet Ttake 1 tablet twice daily  . Ascorbic Acid (VITAMIN C) 100 MG tablet Take 100 mg by mouth daily.  Marland Kitchen aspirin EC 81 MG tablet Take 81 mg by mouth daily.  Marland Kitchen atorvastatin (LIPITOR) 20 MG tablet TAKE 1 TABLET(20 MG) BY MOUTH EVERY DAY  . calcium carbonate (OSCAL) 1500 (600 Ca) MG TABS tablet Take 600 mg of elemental calcium by mouth daily.  . Cholecalciferol (VITAMIN D3 PO) Take 2 capsules by mouth daily.  . furosemide (LASIX) 40 MG tablet Take 1 tablet (40 mg total) by mouth daily as needed for edema.  . isosorbide mononitrate (IMDUR) 30 MG 24 hr tablet TAKE 1 TABLET(30 MG) BY MOUTH DAILY  . metoprolol tartrate (LOPRESSOR) 25 MG tablet Take 1 tablet (25 mg total) by mouth 2 (two) times daily.  . Multiple Vitamin (MULTIVITAMIN WITH MINERALS) TABS tablet Take 1 tablet by mouth daily.  . Multiple Vitamins-Minerals (OCUVITE PRESERVISION PO) Take 1 tablet by mouth in the morning and at bedtime.  Marland Kitchen omeprazole (PRILOSEC) 20 MG capsule Take 1 capsule (20 mg total) by mouth daily.  Marland Kitchen OVER THE COUNTER MEDICATION Take 1 tablet by mouth daily. Circulation vitamin from [facility] -  . triamcinolone  (NASACORT) 55 MCG/ACT AERO nasal inhaler Place 1 spray into the nose daily as needed (allergies).  . vitamin E 200 UNIT capsule Take 200 Units by mouth daily.  . [DISCONTINUED] ZINC-VITAMIN C PO Take 2 tablets by mouth daily.   No facility-administered encounter medications on file as of 06/06/2020.   Allergies  Allergen Reactions  . Clarithromycin     Numbness of extremities  . Codeine Other (See Comments)    Caused pain  . Penicillins Rash    Did it involve swelling of the face/tongue/throat, SOB, or low BP? No Did it involve sudden or severe rash/hives, skin peeling, or any reaction on the inside of your mouth or nose? Yes Did you need to seek medical attention at a hospital or doctor's office? Yes When did it last happen?20 years If all above answers are "NO", may proceed with cephalosporin use.    SDOH Screenings   Alcohol Screen: Not on file  Depression (IWP8-0): Not on file  Financial Resource Strain: Not on file  Food Insecurity: No Food Insecurity  . Worried About Charity fundraiser in the Last Year: Never true  . Ran Out of Food in the Last Year: Never true  Housing: Low Risk   . Last Housing Risk Score: 0  Physical Activity: Not on file  Social Connections: Not on file  Stress: Not on file  Tobacco Use: Low Risk   . Smoking Tobacco Use: Never Smoker  . Smokeless Tobacco Use: Never Used  Transportation Needs: No  Transportation Needs  . Lack of Transportation (Medical): No  . Lack of Transportation (Non-Medical): No   Current Diagnosis/Assessment:  Goals Addressed            This Visit's Progress   . Pharmacy Care Plan       CARE PLAN ENTRY (see longitudinal plan of care for additional care plan information)  Current Barriers:  . Chronic Disease Management support, education, and care coordination needs related to Hypertension and Coronary Artery Disease   Hypertension BP Readings from Last 3 Encounters:  02/16/20 (!) 108/52  02/10/20 108/62   11/08/19 138/82   . Pharmacist Clinical Goal(s): o Over the next 90 days, patient will work with PharmD and providers to maintain BP goal <130/80 . Current regimen:   Furosemide 40 mg daily prn edema  Isosorbide mn 30 mg daily   Metoprolol tartrate 25 mg bid . Interventions: o Discussed healthy diet choices and incorporating more vegetables in diet.  o Discussed goals of exercise. Encouraged patient to work up slowly to >150 minutes each week on exercise bike.  o Reviewed home blood pressure reading.  . Patient self care activities - Over the next 90 days, patient will: o Check BP monthly, document, and provide at future appointments o Ensure daily salt intake < 2300 mg/day  Hyperlipidemia/CAD Lab Results  Component Value Date/Time   LDLCALC 86 02/10/2020 09:57 AM   . Pharmacist Clinical Goal(s): o Over the next 90 days, patient will work with PharmD and providers to achieve LDL goal < 70 . Current regimen:  . aspirin EC 81 mg daily . Atorvastatin 20 mg daily . Interventions: o Discussed healthy diet and exercise goals.  o Encouraged patient to begin working on >150 minutes on exercise bike each week of moderate exercise.  o Updated lipid panel scheduled for 08/22/2019. Will review at next visit.   . Patient self care activities - Over the next 90 days, patient will: o Continue to take medication daily as prescribed.  o Work on [employer] and increasing exercise.   Diabetes Lab Results  Component Value Date/Time   HGBA1C 7.1 (H) 02/10/2020 09:57 AM   HGBA1C 6.7 (H) 11/08/2019 10:10 AM   . Pharmacist Clinical Goal(s): o Over the next 90 days, patient will work with PharmD and providers to achieve A1c goal <7% . Current regimen:  o Diet and Exercise . Interventions: o Discussed importance of healthy diet and monitoring carbohydrate and sugar intake.  o Encouraged patient the importance of exercise in managing elevated blood sugar. Discussed goal of 150 minutes  each week.  o Discussed the benefit of weight loss in managing/preventing diabetes.  . Patient self care activities - Over the next 90 days, patient will: o Patient plans to watch diet more closely after Christmas. Will work on increasing her time on exercise bike and balancing her plate.   Afib . Pharmacist Clinical Goal(s) o Over the next 90 days, patient will work with PharmD and providers to manage Afib and prevent blood clots . Current regimen:  o Eliquis 5 mg bid  o Metoprolol tartrate 25 mg bid  . Interventions: o Reviewed cost of Eliquis and options to make more affordable.  o Encouraged patient to apply for Extra help on medicare to make medications more affordable. Provided patient [facility] phone number to request an application ([phone]).  o Reviewed patient assistance application requirements to receive Eliquis through [employer]. Patient doesn't feel that she will meet the 3% out of pocket  requirement. Patient will call pharmacist if that changes.  o Pharmacist coordinated 1 month of Eliquis samples to be picked up in the office at patient convenience.  . Patient self care activities - Over the next 90 days, patient will: o Fill out application for Medicare Extra Help.  o Contact pharmacist if needs to proceed with patient assistance.  o Pick up samples from office for additional month of Eliquis.   Medication management . Pharmacist Clinical Goal(s): o Over the next 90 days, patient will work with PharmD and providers to maintain optimal medication adherence . Current pharmacy: [facility] . Interventions o Comprehensive medication review performed. o Continue current medication management strategy.  . Patient self care activities - Over the next 90 days, patient will: o Focus on medication adherence by continuing to use pill box o Take medications as prescribed o Report any questions or concerns to PharmD and/or provider(s)  Initial goal  documentation        Hypertensive heart disease   BP today is:  <130/80  Office blood pressures are  BP Readings from Last 3 Encounters:  02/16/20 (!) 108/52  02/10/20 108/62  11/08/19 138/82    Patient has failed these meds in the past: none reported Patient is currently controlled on the following medications:   Furosemide 40 mg daily prn edema  Isosorbide mn 30 mg daily   Metoprolol tartrate 25 mg bid  Patient checks BP at home infrequently  Patient home BP readings are ranging: 110/55, 107/63 mmHg  We discussed diet and exercise extensively.   Patient checked bp at home this am. Reading was 110/55. Patient denies dizziness or falls. Reports that she did take furosemide last night for swelling.   Diet: Patient doesn't eat a lot of salt. She doesn't prefer salty things. Doesn't salt her food. Eats out several times a week. Doesn't salt eggs when scrambling at home. Patient doesn't have a big appetite. Eats some vegetables but not a lot. Enjoys cheeseburgers or fish when dining out. Drinks decaf coffee and sweet tea.   Exercise: Does not do a lot of exercise. Has an exercise bike at home. Uses it occasionally. She knows she needs to do more. Can't walk in her driveway due to neighbor's dogs trying to attack.   Plan  Continue current medications     Hyperlipidemia/CAD   LDL goal < 70  Last lipids Lab Results  Component Value Date   CHOL 147 02/10/2020   HDL 39 (L) 02/10/2020   LDLCALC 86 02/10/2020   TRIG 122 02/10/2020   CHOLHDL 3.8 02/10/2020   Hepatic Function Latest Ref Rng & Units 02/10/2020 11/08/2019 10/08/2019  Total Protein 6.0 - 8.5 g/dL 6.2 6.0 6.2  Albumin 3.7 - 4.7 g/dL 3.8 3.8 3.7  AST 0 - 40 IU/L 13 18 29   ALT 0 - 32 IU/L 14 17 21   Alk Phosphatase 48 - 121 IU/L 111 100 100  Total Bilirubin 0.0 - 1.2 mg/dL 0.7 0.6 0.9  Bilirubin, Direct 0.00 - 0.40 mg/dL - - 0.26     The 10-year ASCVD risk score Mikey Bussing DC Jr., et al., 2013) is: 18.3%    Values used to calculate the score:     Age: 75 years     Sex: Female     Is Non-Hispanic African American: No     Diabetic: No     Tobacco smoker: No     Systolic Blood Pressure: 242 mmHg     Is BP treated: Yes  HDL Cholesterol: 39 mg/dL     Total Cholesterol: 147 mg/dL   Patient has failed these meds in past: none reported Patient is currently uncontrolled on the following medications:  . aspirin EC 81 mg daily . Atorvastatin 20 mg daily   We discussed:  diet and exercise extensively.   Plan  Continue current medications    AFIB   Patient is currently rate controlled. HR 66 BPM  Patient has failed these meds in past: none reported Patient is currently controlled on the following medications:   Eliquis 5 mg bid   Metoprolol tartrate 25 mg bid  We discussed:   Patient had COVID 06/2019 and developed Afib. She reports that she developed a blood clot in her lung. She was told by cardiology and the hospital that she would remain on Eliquis for her lifetime.   Discussed affordability of Eliquis. Patient has been receiving through samples. Coordinated additional samples for 4 weeks of Eliquis.Recommended patient apply for extra help with Medicare for 2022. Provided patient with phone number to request application. Discussed options of patient assistance and out of pocket requirements. Patient does not currently spend 3% of income on other medications.   Plan  Continue current medications  Diabetes   Recent Relevant Labs: Lab Results  Component Value Date/Time   HGBA1C 7.1 (H) 02/10/2020 09:57 AM   HGBA1C 6.7 (H) 11/08/2019 10:10 AM    Lab Results  Component Value Date   CREATININE 0.95 02/10/2020   BUN 15 02/10/2020   GFRNONAA 58 (L) 02/10/2020   GFRAA 67 02/10/2020   NA 139 02/10/2020   K 4.1 02/10/2020   CALCIUM 9.1 02/10/2020   CO2 22 02/10/2020     Checking BG: Never  Patient has failed these meds in past: none Patient is currently uncontrolled  on the following medications:   Diet and Lifestyle   We discussed: diet and exercise extensively. Encouraged patient to watch diet and use my plate method to improve blood sugar management. Discussed riding exercise bike at home with goal of working to 150 minutes each week. Discussed benefits of weight loss for improved blood sugar control.   Patient not currently riding exercise bike regularly. Patient not watching diet closely at this time. She states she will try to improve.   Plan  Continue control with diet and exercise   GERD   Patient has failed these meds in past: none reported Patient is currently controlled on the following medications:  . Omeprazole 20 mg daily   We discussed: Tries to avoid eating late to reduce symptoms. Discussed avoiding tight waistbands, elevating head of bed and eating small meals.   Plan  Continue current medications   Osteopenia / Osteoporosis   Last DEXA Scan: 09/21  T-Score femoral neck: -1.4  T-Score forearm radius: -1.8  10-year probability of major osteoporotic fracture: 17.7  10-year probability of hip fracture: 8.8  Vit D, 25-Hydroxy  Date Value Ref Range Status  02/10/2020 51.7 30.0 - 100.0 ng/mL Final    Comment:    Vitamin D deficiency has been defined by the Institute of Medicine and an Endocrine Society practice guideline as a level of serum 25-OH vitamin D less than 20 ng/mL (1,2). The Endocrine Society went on to further define vitamin D insufficiency as a level between 21 and 29 ng/mL (2). 1. IOM (Institute of Medicine). 2010. Dietary reference    intakes for calcium and D. [place]: The    [employer]. 2. Holick MF, Binkley [place],  Bischoff-Ferrari HA, et al.    Evaluation, treatment, and prevention of vitamin D    deficiency: an Endocrine Society clinical practice    guideline. JCEM. 2011 Jul; 96(7):1911-30.      Patient is a candidate for pharmacologic treatment due to T-Score -1.0 to -2.5 and  10-year risk of hip fracture > 3%  Patient has failed these meds in past: none reported Patient is currently controlled on the following medications:  Marland Kitchen Vitamin D daily  . Calcium 600 mg daily   We discussed:  Recommend [phone] units of vitamin D daily. Recommend 1200 mg of calcium daily from dietary and supplemental sources. Recommend weight-bearing and muscle strengthening exercises for building and maintaining bone density.   Patient drinks milk and eats yogurt/cheese. Patient does not want to add medication at this time for osteoporosis but would be eligible due to T-score and hip fracture >3%. .   Plan  Continue current medications  Health Maintenance   Patient is currently controlled on the following medications:  . Triamcinolone 55 mcg 1 spray into the nose daily prn allergies . Supplement  o Multivitamin daily  o Vitamin C daily  o Vitamin E daily  o Preservision bid  o Circulation vitamin daily   We discussed:  Discussed the overlapping ingredients in patient's supplements and ability to simplify regimen. Patient acknowledges understanding and is considering discontinuing vitamin C, vitamin E, circulation vitamin.   Discussed patient's cost or supplements and to check to see if her part D has a benefit covering supplements.   Plan  Consider discontinuing Vitamin C, Vitamin E and circulation vitamin.   Vaccines   Reviewed and discussed patient's vaccination history.  Discussed third COVID shot - patient will consider. Discussed updated Shingrix shot recommendation.   Immunization History  Administered Date(s) Administered  . Fluad Quad(high Dose 65+) 03/23/2020  . Influenza-Unspecified 03/13/2017, 06/24/2018, 05/07/2019  . PFIZER SARS-COV-2 Vaccination 12/09/2019, 12/28/2019  . Pneumococcal Conjugate-13 04/13/2014  . Pneumococcal-Unspecified 05/31/2009  . Zoster 02/11/2014    Plan  Recommended patient receive third vaccine in office.   Medication  Management   Patient's preferred pharmacy is:  [id number] [address], [address] [address] Phone: [phone] Fax: [phone]  Uses pill box? Yes Pt endorses good compliance  We discussed: Current pharmacy is preferred with insurance plan and patient is satisfied with pharmacy services   Patient reports that her Part D is a Product/process development scientist. Eliquis and OTC supplements are her main cost concern. She or her daughter drives to pick up medications.   Plan  Continue current medication management strategy    Follow up: 3 month phone visit

## 2020-06-06 ENCOUNTER — Ambulatory Visit: Payer: Medicare Other

## 2020-06-06 ENCOUNTER — Other Ambulatory Visit: Payer: Self-pay

## 2020-06-06 DIAGNOSIS — I110 Hypertensive heart disease with heart failure: Secondary | ICD-10-CM

## 2020-06-06 DIAGNOSIS — I25118 Atherosclerotic heart disease of native coronary artery with other forms of angina pectoris: Secondary | ICD-10-CM

## 2020-06-06 DIAGNOSIS — E782 Mixed hyperlipidemia: Secondary | ICD-10-CM

## 2020-06-06 NOTE — Patient Instructions (Addendum)
Visit Information  Thank you for your time discussing your medications. I look forward to working with you to achieve your health care goals. Below is a summary of what we talked about during our visit.   Goals Addressed            This Visit's Progress   . Pharmacy Care Plan       CARE PLAN ENTRY (see longitudinal plan of care for additional care plan information)  Current Barriers:  . Chronic Disease Management support, education, and care coordination needs related to Hypertension and Coronary Artery Disease   Hypertension BP Readings from Last 3 Encounters:  02/16/20 (!) 108/52  02/10/20 108/62  11/08/19 138/82   . Pharmacist Clinical Goal(s): o Over the next 90 days, patient will work with PharmD and providers to maintain BP goal <130/80 . Current regimen:   Furosemide 40 mg daily prn edema  Isosorbide mn 30 mg daily   Metoprolol tartrate 25 mg bid . Interventions: o Discussed healthy diet choices and incorporating more vegetables in diet.  o Discussed goals of exercise. Encouraged patient to work up slowly to >150 minutes each week on exercise bike.  o Reviewed home blood pressure reading.  . Patient self care activities - Over the next 90 days, patient will: o Check BP monthly, document, and provide at future appointments o Ensure daily salt intake < 2300 mg/day  Hyperlipidemia/CAD Lab Results  Component Value Date/Time   LDLCALC 86 02/10/2020 09:57 AM   . Pharmacist Clinical Goal(s): o Over the next 90 days, patient will work with PharmD and providers to achieve LDL goal < 70 . Current regimen:  . aspirin EC 81 mg daily . Atorvastatin 20 mg daily . Interventions: o Discussed healthy diet and exercise goals.  o Encouraged patient to begin working on >150 minutes on exercise bike each week of moderate exercise.  o Updated lipid panel scheduled for 08/22/2019. Will review at next visit.   . Patient self care activities - Over the next 90 days, patient  will: o Continue to take medication daily as prescribed.  o Work on [employer] and increasing exercise.   Diabetes Lab Results  Component Value Date/Time   HGBA1C 7.1 (H) 02/10/2020 09:57 AM   HGBA1C 6.7 (H) 11/08/2019 10:10 AM   . Pharmacist Clinical Goal(s): o Over the next 90 days, patient will work with PharmD and providers to achieve A1c goal <7% . Current regimen:  o Diet and Exercise . Interventions: o Discussed importance of healthy diet and monitoring carbohydrate and sugar intake.  o Encouraged patient the importance of exercise in managing elevated blood sugar. Discussed goal of 150 minutes each week.  o Discussed the benefit of weight loss in managing/preventing diabetes.  . Patient self care activities - Over the next 90 days, patient will: o Patient plans to watch diet more closely after Christmas. Will work on increasing her time on exercise bike and balancing her plate.   Afib . Pharmacist Clinical Goal(s) o Over the next 90 days, patient will work with PharmD and providers to manage Afib and prevent blood clots . Current regimen:  o Eliquis 5 mg bid  o Metoprolol tartrate 25 mg bid  . Interventions: o Reviewed cost of Eliquis and options to make more affordable.  o Encouraged patient to apply for Extra help on medicare to make medications more affordable. Provided patient [facility] phone number to request an application ([phone]).  o Reviewed patient assistance application requirements to receive Eliquis  through [employer]. Patient doesn't feel that she will meet the 3% out of pocket requirement. Patient will call pharmacist if that changes.  o Pharmacist coordinated 1 month of Eliquis samples to be picked up in the office at patient convenience.  . Patient self care activities - Over the next 90 days, patient will: o Fill out application for Medicare Extra Help.  o Contact pharmacist if needs to proceed with patient assistance.  o Pick up  samples from office for additional month of Eliquis.   Medication management . Pharmacist Clinical Goal(s): o Over the next 90 days, patient will work with PharmD and providers to maintain optimal medication adherence . Current pharmacy: [facility] . Interventions o Comprehensive medication review performed. o Continue current medication management strategy.  . Patient self care activities - Over the next 90 days, patient will: o Focus on medication adherence by continuing to use pill box o Take medications as prescribed o Report any questions or concerns to PharmD and/or provider(s)  Initial goal documentation        Ms. Schiano was given information about Chronic Care Management services today including:  1. CCM service includes personalized support from designated clinical staff supervised by her physician, including individualized plan of care and coordination with other care providers 2. 24/7 contact phone numbers for assistance for urgent and routine care needs. 3. Standard insurance, coinsurance, copays and deductibles apply for chronic care management only during months in which we provide at least 20 minutes of these services. Most insurances cover these services at 100%, however patients may be responsible for any copay, coinsurance and/or deductible if applicable. This service may help you avoid the need for more expensive face-to-face services. 4. Only one practitioner may furnish and bill the service in a calendar month. 5. The patient may stop CCM services at any time (effective at the end of the month) by phone call to the office staff.  Patient agreed to services and verbal consent obtained.   The patient verbalized understanding of instructions, educational materials, and care plan provided today and agreed to receive a mailed copy of patient instructions, educational materials, and care plan.  Telephone follow up appointment with pharmacy team member scheduled for:  08/2020  Sherre Poot, PharmD Clinical Pharmacist [facility] [phone] (office) [phone] (mobile)  Diabetes Mellitus and Nutrition, Adult When you have diabetes (diabetes mellitus), it is very important to have healthy eating habits because your blood sugar (glucose) levels are greatly affected by what you eat and drink. Eating healthy foods in the appropriate amounts, at about the same times every day, can help you:  Control your blood glucose.  Lower your risk of heart disease.  Improve your blood pressure.  Reach or maintain a healthy weight. Every person with diabetes is different, and each person has different needs for a meal plan. Your health care provider may recommend that you work with a diet and nutrition specialist (dietitian) to make a meal plan that is best for you. Your meal plan may vary depending on factors such as:  The calories you need.  The medicines you take.  Your weight.  Your blood glucose, blood pressure, and cholesterol levels.  Your activity level.  Other health conditions you have, such as heart or kidney disease. How do carbohydrates affect me? Carbohydrates, also called carbs, affect your blood glucose level more than any other type of food. Eating carbs naturally raises the amount of glucose in your blood. Carb counting is a  method for keeping track of how many carbs you eat. Counting carbs is important to keep your blood glucose at a healthy level, especially if you use insulin or take certain oral diabetes medicines. It is important to know how many carbs you can safely have in each meal. This is different for every person. Your dietitian can help you calculate how many carbs you should have at each meal and for each snack. Foods that contain carbs include:  Bread, cereal, rice, pasta, and crackers.  Potatoes and corn.  Peas, beans, and lentils.  Milk and yogurt.  Fruit and juice.  Desserts, such as cakes, cookies, ice  cream, and candy. How does alcohol affect me? Alcohol can cause a sudden decrease in blood glucose (hypoglycemia), especially if you use insulin or take certain oral diabetes medicines. Hypoglycemia can be a life-threatening condition. Symptoms of hypoglycemia (sleepiness, dizziness, and confusion) are similar to symptoms of having too much alcohol. If your health care provider says that alcohol is safe for you, follow these guidelines:  Limit alcohol intake to no more than 1 drink per day for nonpregnant women and 2 drinks per day for men. One drink equals 12 oz of beer, 5 oz of wine, or 1 oz of hard liquor.  Do not drink on an empty stomach.  Keep yourself hydrated with water, diet soda, or unsweetened iced tea.  Keep in mind that regular soda, juice, and other mixers may contain a lot of sugar and must be counted as carbs. What are tips for following this plan?  Reading food labels  Start by checking the serving size on the "Nutrition Facts" label of packaged foods and drinks. The amount of calories, carbs, fats, and other nutrients listed on the label is based on one serving of the item. Many items contain more than one serving per package.  Check the total grams (g) of carbs in one serving. You can calculate the number of servings of carbs in one serving by dividing the total carbs by 15. For example, if a food has 30 g of total carbs, it would be equal to 2 servings of carbs.  Check the number of grams (g) of saturated and trans fats in one serving. Choose foods that have low or no amount of these fats.  Check the number of milligrams (mg) of salt (sodium) in one serving. Most people should limit total sodium intake to less than 2,300 mg per day.  Always check the nutrition information of foods labeled as "low-fat" or "nonfat". These foods may be higher in added sugar or refined carbs and should be avoided.  Talk to your dietitian to identify your daily goals for nutrients listed on  the label. Shopping  Avoid buying canned, premade, or processed foods. These foods tend to be high in fat, sodium, and added sugar.  Shop around the outside edge of the grocery store. This includes fresh fruits and vegetables, bulk grains, fresh meats, and fresh dairy. Cooking  Use low-heat cooking methods, such as baking, instead of high-heat cooking methods like deep frying.  Cook using healthy oils, such as olive, canola, or sunflower oil.  Avoid cooking with butter, cream, or high-fat meats. Meal planning  Eat meals and snacks regularly, preferably at the same times every day. Avoid going long periods of time without eating.  Eat foods high in fiber, such as fresh fruits, vegetables, beans, and whole grains. Talk to your dietitian about how many servings of carbs you can eat at each  meal.  Eat 4-6 ounces (oz) of lean protein each day, such as lean meat, chicken, fish, eggs, or tofu. One oz of lean protein is equal to: ? 1 oz of meat, chicken, or fish. ? 1 egg. ?  cup of tofu.  Eat some foods each day that contain healthy fats, such as avocado, nuts, seeds, and fish. Lifestyle  Check your blood glucose regularly.  Exercise regularly as told by your health care provider. This may include: ? 150 minutes of moderate-intensity or vigorous-intensity exercise each week. This could be brisk walking, biking, or water aerobics. ? Stretching and doing strength exercises, such as yoga or weightlifting, at least 2 times a week.  Take medicines as told by your health care provider.  Do not use any products that contain nicotine or tobacco, such as cigarettes and e-cigarettes. If you need help quitting, ask your health care provider.  Work with a Social worker or diabetes educator to identify strategies to manage stress and any emotional and social challenges. Questions to ask a health care provider  Do I need to meet with a diabetes educator?  Do I need to meet with a dietitian?  What  number can I call if I have questions?  When are the best times to check my blood glucose? Where to find more information:  American Diabetes Association: diabetes.org  Academy of Nutrition and Dietetics: www.eatright.[employer] of Diabetes and Digestive and Kidney Diseases (NIH): [employer] Summary  A healthy meal plan will help you control your blood glucose and maintain a healthy lifestyle.  Working with a diet and nutrition specialist (dietitian) can help you make a meal plan that is best for you.  Keep in mind that carbohydrates (carbs) and alcohol have immediate effects on your blood glucose levels. It is important to count carbs and to use alcohol carefully. This information is not intended to replace advice given to you by your health care provider. Make sure you discuss any questions you have with your health care provider. Document Revised: 05/23/2017 Document Reviewed: 07/15/2016 Elsevier Patient Education  2020 [employer].

## 2020-06-20 DIAGNOSIS — H353212 Exudative age-related macular degeneration, right eye, with inactive choroidal neovascularization: Secondary | ICD-10-CM | POA: Diagnosis not present

## 2020-06-20 DIAGNOSIS — H43813 Vitreous degeneration, bilateral: Secondary | ICD-10-CM | POA: Diagnosis not present

## 2020-06-20 DIAGNOSIS — H353121 Nonexudative age-related macular degeneration, left eye, early dry stage: Secondary | ICD-10-CM | POA: Diagnosis not present

## 2020-07-15 ENCOUNTER — Other Ambulatory Visit: Payer: Self-pay | Admitting: Cardiology

## 2020-07-17 NOTE — Telephone Encounter (Signed)
Refill sent to pharmacy.   

## 2020-07-18 ENCOUNTER — Other Ambulatory Visit: Payer: Self-pay | Admitting: Physician Assistant

## 2020-07-18 MED ORDER — APIXABAN 5 MG PO TABS: ORAL_TABLET | ORAL | 2 refills | Status: DC

## 2020-07-19 ENCOUNTER — Other Ambulatory Visit: Payer: Self-pay | Admitting: Physician Assistant

## 2020-07-19 ENCOUNTER — Telehealth: Payer: Self-pay

## 2020-07-19 MED ORDER — RIVAROXABAN 20 MG PO TABS: 20.0000 mg | ORAL_TABLET | Freq: Every day | ORAL | 2 refills | Status: DC

## 2020-07-19 NOTE — Telephone Encounter (Signed)
Notify pt will change from eliquis to xarelto 20mg  qd

## 2020-07-19 NOTE — Telephone Encounter (Signed)
Pt called need refill for eliquis, Her insurance denied the PA for eliquis and sts they will cover : Xarelto or Xarelto Starter Pack. Pt uses [facility] on [address]

## 2020-07-20 ENCOUNTER — Telehealth: Payer: Self-pay | Admitting: Cardiology

## 2020-07-20 NOTE — Telephone Encounter (Signed)
Patient calling the office for samples of medication:   1.  What medication and dosage are you requesting samples for? Eliquis or Xarelto 2.  Are you currently out of this medication?  If there are no samples, she  need something else in the place of Eliquis and Xarelto

## 2020-07-20 NOTE — Telephone Encounter (Signed)
Spoke to the patient just now. The patient was reaching out to see if we had any samples of Eliquis or Xarelto. I advised her that these two medications should not be taken at this same time. She verbalizes understanding of this but states that she is having financial difficulty with getting her Eliquis and this is why her PCP gave her a prescription for the Xarelto. The Xarelto is very expensive as well and she can not afford to pick it up and will not be starting this medication.  I got two boxes of Eliquis out for her to pick up. She verbalizes understanding and thanks me for the call back.

## 2020-07-21 NOTE — Telephone Encounter (Signed)
Left pt detail message on phone.

## 2020-07-31 ENCOUNTER — Encounter: Payer: Self-pay | Admitting: Nurse Practitioner

## 2020-07-31 ENCOUNTER — Telehealth: Payer: Self-pay

## 2020-07-31 ENCOUNTER — Telehealth (INDEPENDENT_AMBULATORY_CARE_PROVIDER_SITE_OTHER): Payer: Medicare Other | Admitting: Nurse Practitioner

## 2020-07-31 VITALS — Ht 65.0 in | Wt 234.0 lb

## 2020-07-31 DIAGNOSIS — Z7901 Long term (current) use of anticoagulants: Secondary | ICD-10-CM

## 2020-07-31 DIAGNOSIS — R059 Cough, unspecified: Secondary | ICD-10-CM | POA: Diagnosis not present

## 2020-07-31 DIAGNOSIS — J069 Acute upper respiratory infection, unspecified: Secondary | ICD-10-CM

## 2020-07-31 MED ORDER — BENZONATATE 100 MG PO CAPS: 200.0000 mg | ORAL_CAPSULE | Freq: Three times a day (TID) | ORAL | 0 refills | Status: DC | PRN

## 2020-07-31 MED ORDER — LORATADINE 10 MG PO TABS: 10.0000 mg | ORAL_TABLET | Freq: Every day | ORAL | 1 refills | Status: DC

## 2020-07-31 NOTE — Progress Notes (Signed)
Virtual Visit via Telephone Note   This visit type was conducted due to national recommendations for restrictions regarding the COVID-19 Pandemic (e.g. social distancing) in an effort to limit this patient's exposure and mitigate transmission in our community.  Due to her co-morbid illnesses, this patient is at least at moderate risk for complications without adequate follow up.  This format is felt to be most appropriate for this patient at this time.  The patient did not have access to video technology/had technical difficulties with video requiring transitioning to audio format only (telephone).  All issues noted in this document were discussed and addressed.  No physical exam could be performed with this format.  Patient verbally consented to a telehealth visit.   Date:  07/31/2020   ID:  [name], DOB May 20, 1941, MRN [id number]  Patient Location: Home Provider Location: Office/Clinic  PCP:  [name], PA-C   Evaluation Performed: Established patient, acute telemedicine visit  Chief Complaint: Sinus congestion  History of Present Illness:    Miranda Moses is a 80 y.o. female presents with sinus congestion, rhinorrhea, post-nasal drip, and sore throat. Onset of symptoms was 2-days-ago. Treatment has included Mucinex, throat lozenges, and pushing fluids. She denies known exposure to ill-contacts. She has obtained COVID-19 vaccines. She tells me she had COVID-19 pneumonia and PEs in Feb 2021 which required 8-day hospitalization. She is currently on Eliquis 5 mg daily for DVT/PE prophylaxis.  The patient does have symptoms concerning for COVID-19 infection (fever, chills, cough, or new shortness of breath).    Past Medical History:  Diagnosis Date  . Angina pectoris ([place]) 02/18/2018  . CAD in native artery    by CT coronary 10/2018 >> medical therapy   . CHF (congestive heart failure), NYHA class I, chronic, diastolic ([place]) 09/14/4008  . Chronic diastolic (congestive) heart failure ([place])    . Coronary artery disease of native artery of native heart with stable angina pectoris ([place]) 08/09/2019  . COVID-19 virus infection   . Family history of coronary artery disease 01/30/2018  . Gastro-esophageal reflux disease without esophagitis 01/30/2018  . GERD (gastroesophageal reflux disease)   . History of pulmonary embolism 08/23/2019  . Hyperlipidemia   . Hypertension   . Hypertensive heart disease with heart failure ([place]) 01/30/2018  . Mixed hyperlipidemia 02/18/2018  . New onset atrial fibrillation ([place])   . PAD (peripheral artery disease) ([place]) 08/28/2018  . PAH (pulmonary artery hypertension) ([place]) 08/28/2018  . Pre-diabetes   . Pulmonary embolus ([place])   . Pulmonary hypertension ([place])   . Vitamin D deficiency 08/09/2019    Past Surgical History:  Procedure Laterality Date  . ABDOMINAL HYSTERECTOMY    . CATARACT EXTRACTION Left 03/2014  . CATARACT EXTRACTION Right 05/2014  . COLONOSCOPY  11/05/2013    Family History  Problem Relation Age of Onset  . Heart disease Father     Social History   Socioeconomic History  . Marital status: Widowed    Spouse name: Not on file  . Number of children: 1  . Years of education: Not on file  . Highest education level: Not on file  Occupational History  . Occupation: retired  Tobacco Use  . Smoking status: Never Smoker  . Smokeless tobacco: Never Used  Vaping Use  . Vaping Use: Never used  Substance and Sexual Activity  . Alcohol use: Never  . Drug use: Never  . Sexual activity: Not on file  Other Topics Concern  . Not on file  Social History Narrative  .  Not on file   Social Determinants of Health   Financial Resource Strain: Not on file  Food Insecurity: No Food Insecurity  . Worried About Charity fundraiser in the Last Year: Never true  . Ran Out of Food in the Last Year: Never true  Transportation Needs: No Transportation Needs  . Lack of Transportation (Medical): No  . Lack of Transportation (Non-Medical): No   Physical Activity: Not on file  Stress: Not on file  Social Connections: Not on file  Intimate Partner Violence: Not on file    Outpatient Medications Prior to Visit  Medication Sig Dispense Refill  . Ascorbic Acid (VITAMIN C) 100 MG tablet Take 100 mg by mouth daily.    Marland Kitchen aspirin EC 81 MG tablet Take 81 mg by mouth daily.    Marland Kitchen atorvastatin (LIPITOR) 20 MG tablet TAKE 1 TABLET(20 MG) BY MOUTH EVERY DAY 90 tablet 1  . calcium carbonate (OSCAL) 1500 (600 Ca) MG TABS tablet Take 600 mg of elemental calcium by mouth daily.    . Cholecalciferol (VITAMIN D3 PO) Take 2 capsules by mouth daily.    . furosemide (LASIX) 40 MG tablet Take 1 tablet (40 mg total) by mouth daily as needed for edema. 90 tablet 1  . isosorbide mononitrate (IMDUR) 30 MG 24 hr tablet TAKE 1 TABLET(30 MG) BY MOUTH DAILY 90 tablet 1  . Multiple Vitamin (MULTIVITAMIN WITH MINERALS) TABS tablet Take 1 tablet by mouth daily.    . Multiple Vitamins-Minerals (OCUVITE PRESERVISION PO) Take 1 tablet by mouth in the morning and at bedtime.    Marland Kitchen omeprazole (PRILOSEC) 20 MG capsule Take 1 capsule (20 mg total) by mouth daily. 180 capsule 0  . OVER THE COUNTER MEDICATION Take 1 tablet by mouth daily. Circulation vitamin from QVC -    . rivaroxaban (XARELTO) 20 MG TABS tablet Take 1 tablet (20 mg total) by mouth daily with supper. 30 tablet 2  . triamcinolone (NASACORT) 55 MCG/ACT AERO nasal inhaler Place 1 spray into the nose daily as needed (allergies).    . vitamin E 200 UNIT capsule Take 200 Units by mouth daily.    . metoprolol tartrate (LOPRESSOR) 25 MG tablet Take 1 tablet (25 mg total) by mouth 2 (two) times daily. 180 tablet 1   No facility-administered medications prior to visit.    Allergies:   Clarithromycin, Codeine, and Penicillins   Social History   Tobacco Use  . Smoking status: Never Smoker  . Smokeless tobacco: Never Used  Vaping Use  . Vaping Use: Never used  Substance Use Topics  . Alcohol use: Never  .  Drug use: Never     Review of Systems  Constitutional: Positive for malaise/fatigue. Negative for fever.  HENT: Positive for sore throat. Negative for ear pain. Congestion: rhinorrhea.        Post-nasal-drip  Eyes: Negative for pain.  Respiratory: Positive for cough. Negative for sputum production, shortness of breath and wheezing.   Cardiovascular: Negative for chest pain, palpitations and orthopnea.  Gastrointestinal: Positive for diarrhea (mild, intermittent). Negative for abdominal pain, constipation, nausea and vomiting.  Genitourinary: Negative for dysuria, frequency and urgency.  Musculoskeletal: Negative for joint pain and myalgias.  Skin: Negative for rash.  Neurological: Negative for dizziness and headaches.  Endo/Heme/Allergies: Positive for environmental allergies.     Labs/Other Tests and Data Reviewed:    Recent Labs: 02/10/2020: ALT 14; BUN 15; Creatinine, Ser 0.95; Hemoglobin 11.7; Platelets 190; Potassium 4.1; Sodium 139; TSH 1.600  Recent Lipid Panel Lab Results  Component Value Date/Time   CHOL 147 02/10/2020 09:57 AM   TRIG 122 02/10/2020 09:57 AM   HDL 39 (L) 02/10/2020 09:57 AM   CHOLHDL 3.8 02/10/2020 09:57 AM   LDLCALC 86 02/10/2020 09:57 AM    Wt Readings from Last 3 Encounters:  02/16/20 234 lb (106.1 kg)  02/10/20 231 lb (104.8 kg)  11/08/19 240 lb (108.9 kg)     Objective:    Vital Signs:  Ht 5\' 5"  (1.651 m)   Wt 234 lb (106.1 kg)   BMI 38.94 kg/m    Physical Exam No physical exam performed due to telemedicine visit  ASSESSMENT & PLAN:    1. Viral upper respiratory tract infection - loratadine (CLARITIN) 10 MG tablet; Take 1 tablet (10 mg total) by mouth daily.  Dispense: 90 tablet; Refill: 1  2. Cough - benzonatate (TESSALON) 100 MG capsule; Take 2 capsules (200 mg total) by mouth 3 (three) times daily as needed for cough.  Dispense: 20 capsule; Refill: 0  Rest and push fluids Continue Mucinex, Flonase nasal spray, Claritin and  Tessalon Perles daily Notify office if symptoms fail to improve or worsen  COVID-19 Education: The signs and symptoms of COVID-19 were discussed with the patient and how to seek care for testing (follow up with PCP or arrange E-visit). The importance of social distancing was discussed today.   I spent 15 minutes dedicated to the care of this patient on the date of this encounter to include telephone time with the patient, as well as:EMR review and prescription medication management.  Follow Up:  Virtual Visit  prn  Varney Baas, DNP  07/31/2020 11:37 AM    [place]

## 2020-07-31 NOTE — Progress Notes (Signed)
Chronic Care Management Pharmacy Assistant   Name: Alahia Whicker  MRN: [id number] DOB: 1940-11-05  Reason for Encounter: General adherence call and assistance for Eliquis Patient Questions:  1.  Have you seen any other providers since your last visit? No  2.  Any changes in your medicines or health? No    PCP : [name], PA-C  Allergies:   Allergies  Allergen Reactions  . Clarithromycin     Numbness of extremities  . Codeine Other (See Comments)    Caused pain  . Penicillins Rash    Did it involve swelling of the face/tongue/throat, SOB, or low BP? No Did it involve sudden or severe rash/hives, skin peeling, or any reaction on the inside of your mouth or nose? Yes Did you need to seek medical attention at a hospital or doctor's office? Yes When did it last happen?20 years If all above answers are "NO", may proceed with cephalosporin use.     Medications: Outpatient Encounter Medications as of 07/31/2020  Medication Sig  . Ascorbic Acid (VITAMIN C) 100 MG tablet Take 100 mg by mouth daily.  Marland Kitchen aspirin EC 81 MG tablet Take 81 mg by mouth daily.  Marland Kitchen atorvastatin (LIPITOR) 20 MG tablet TAKE 1 TABLET(20 MG) BY MOUTH EVERY DAY  . calcium carbonate (OSCAL) 1500 (600 Ca) MG TABS tablet Take 600 mg of elemental calcium by mouth daily.  . Cholecalciferol (VITAMIN D3 PO) Take 2 capsules by mouth daily.  . furosemide (LASIX) 40 MG tablet Take 1 tablet (40 mg total) by mouth daily as needed for edema.  . isosorbide mononitrate (IMDUR) 30 MG 24 hr tablet TAKE 1 TABLET(30 MG) BY MOUTH DAILY  . metoprolol tartrate (LOPRESSOR) 25 MG tablet Take 1 tablet (25 mg total) by mouth 2 (two) times daily.  . Multiple Vitamin (MULTIVITAMIN WITH MINERALS) TABS tablet Take 1 tablet by mouth daily.  . Multiple Vitamins-Minerals (OCUVITE PRESERVISION PO) Take 1 tablet by mouth in the morning and at bedtime.  Marland Kitchen omeprazole (PRILOSEC) 20 MG capsule Take 1 capsule (20 mg total) by mouth daily.  Marland Kitchen OVER  THE COUNTER MEDICATION Take 1 tablet by mouth daily. Circulation vitamin from QVC -  . rivaroxaban (XARELTO) 20 MG TABS tablet Take 1 tablet (20 mg total) by mouth daily with supper.  . triamcinolone (NASACORT) 55 MCG/ACT AERO nasal inhaler Place 1 spray into the nose daily as needed (allergies).  . vitamin E 200 UNIT capsule Take 200 Units by mouth daily.   No facility-administered encounter medications on file as of 07/31/2020.    Current Diagnosis: Patient Active Problem List   Diagnosis Date Noted  . Pulmonary hypertension ([place])   . Pre-diabetes   . Hypertension   . Hyperlipidemia   . GERD (gastroesophageal reflux disease)   . Chronic diastolic (congestive) heart failure ([place])   . CAD in native artery   . Prediabetes 02/10/2020  . Other fatigue 02/10/2020  . Other specified menopausal and perimenopausal disorders 02/10/2020  . History of pulmonary embolism 08/23/2019  . CHF (congestive heart failure), NYHA class I, chronic, diastolic ([place]) 35/00/9381  . Vitamin D deficiency 08/09/2019  . Coronary artery disease of native artery of native heart with stable angina pectoris ([place]) 08/09/2019  . New onset atrial fibrillation ([place])   . Pulmonary embolus ([place])   . COVID-19 virus infection   . PAH (pulmonary artery hypertension) ([place]) 08/28/2018  . PAD (peripheral artery disease) ([place]) 08/28/2018  . Angina pectoris ([place]) 02/18/2018  . Mixed hyperlipidemia  02/18/2018  . Family history of coronary artery disease 01/30/2018  . Hypertensive heart disease with heart failure ([place]) 01/30/2018  . Gastro-esophageal reflux disease without esophagitis 01/30/2018    Patient stated she is doing well, she stays pretty active.  She stated her blood pressure stays around 120/60-130/70  Patient is taking her medications as directed.  Patient denies any side effects from her medication or trouble getting from the pharmacy.  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    She also has [employer] as primary insurance.  I sent Donette Larry, CPP the ID numbers  Follow-Up:  Patient Assistance Coordination and Pharmacist Review  Donette Larry, CPP notified  Clarita Leber, [place] Pharmacist Assistant [phone]

## 2020-08-02 ENCOUNTER — Telehealth: Payer: Self-pay

## 2020-08-02 ENCOUNTER — Telehealth: Payer: Self-pay | Admitting: Cardiology

## 2020-08-02 NOTE — Telephone Encounter (Signed)
.  Patient calling the office for samples of medication:   1.  What medication and dosage are you requesting samples for? Eliquis  2.  Are you currently out of this medication? Patient states she will be out of this medication on Monday 08/07/2020. Please advise.

## 2020-08-02 NOTE — Progress Notes (Signed)
Chronic Care Management Pharmacy Assistant   Name: Lynnlee Revels  MRN: [id number] DOB: 01/03/1941  Reason for Encounter: Medication Review for LIS for Eliquis   PCP : [name], PA-C  Allergies:   Allergies  Allergen Reactions  . Clarithromycin     Numbness of extremities  . Codeine Other (See Comments)    Caused pain  . Penicillins Rash    Did it involve swelling of the face/tongue/throat, SOB, or low BP? No Did it involve sudden or severe rash/hives, skin peeling, or any reaction on the inside of your mouth or nose? Yes Did you need to seek medical attention at a hospital or doctor's office? Yes When did it last happen?20 years If all above answers are "NO", may proceed with cephalosporin use.     Medications: Outpatient Encounter Medications as of 08/02/2020  Medication Sig  . Ascorbic Acid (VITAMIN C) 100 MG tablet Take 100 mg by mouth daily.  Marland Kitchen aspirin EC 81 MG tablet Take 81 mg by mouth daily.  Marland Kitchen atorvastatin (LIPITOR) 20 MG tablet TAKE 1 TABLET(20 MG) BY MOUTH EVERY DAY  . benzonatate (TESSALON) 100 MG capsule Take 2 capsules (200 mg total) by mouth 3 (three) times daily as needed for cough.  . calcium carbonate (OSCAL) 1500 (600 Ca) MG TABS tablet Take 600 mg of elemental calcium by mouth daily.  . Cholecalciferol (VITAMIN D3 PO) Take 2 capsules by mouth daily.  . furosemide (LASIX) 40 MG tablet Take 1 tablet (40 mg total) by mouth daily as needed for edema.  . isosorbide mononitrate (IMDUR) 30 MG 24 hr tablet TAKE 1 TABLET(30 MG) BY MOUTH DAILY  . loratadine (CLARITIN) 10 MG tablet Take 1 tablet (10 mg total) by mouth daily.  . metoprolol tartrate (LOPRESSOR) 25 MG tablet Take 1 tablet (25 mg total) by mouth 2 (two) times daily.  . Multiple Vitamin (MULTIVITAMIN WITH MINERALS) TABS tablet Take 1 tablet by mouth daily.  . Multiple Vitamins-Minerals (OCUVITE PRESERVISION PO) Take 1 tablet by mouth in the morning and at bedtime.  Marland Kitchen omeprazole (PRILOSEC) 20 MG  capsule Take 1 capsule (20 mg total) by mouth daily.  Marland Kitchen OVER THE COUNTER MEDICATION Take 1 tablet by mouth daily. Circulation vitamin from QVC -  . rivaroxaban (XARELTO) 20 MG TABS tablet Take 1 tablet (20 mg total) by mouth daily with supper.  . triamcinolone (NASACORT) 55 MCG/ACT AERO nasal inhaler Place 1 spray into the nose daily as needed (allergies).  . vitamin E 200 UNIT capsule Take 200 Units by mouth daily.   No facility-administered encounter medications on file as of 08/02/2020.    Current Diagnosis: Patient Active Problem List   Diagnosis Date Noted  . Pulmonary hypertension ([place])   . Pre-diabetes   . Hypertension   . Hyperlipidemia   . GERD (gastroesophageal reflux disease)   . Chronic diastolic (congestive) heart failure ([place])   . CAD in native artery   . Prediabetes 02/10/2020  . Other fatigue 02/10/2020  . Other specified menopausal and perimenopausal disorders 02/10/2020  . History of pulmonary embolism 08/23/2019  . CHF (congestive heart failure), NYHA class I, chronic, diastolic ([place]) 40/97/3532  . Vitamin D deficiency 08/09/2019  . Coronary artery disease of native artery of native heart with stable angina pectoris ([place]) 08/09/2019  . New onset atrial fibrillation ([place])   . Pulmonary embolus ([place])   . COVID-19 virus infection   . PAH (pulmonary artery hypertension) ([place]) 08/28/2018  . PAD (peripheral artery disease) ([place]) 08/28/2018  .  Angina pectoris ([place]) 02/18/2018  . Mixed hyperlipidemia 02/18/2018  . Family history of coronary artery disease 01/30/2018  . Hypertensive heart disease with heart failure ([place]) 01/30/2018  . Gastro-esophageal reflux disease without esophagitis 01/30/2018    At the request of Donette Larry, CPP, a LIS form for Eliquis was filled out for the patient.  I called the patient to confirm accounts, rental property, stocks ect...  The form was completed with the patients help and sent off for review.  I told the patient we would  contact her once we had a decision.   Follow-Up:  Patient Assistance Coordination and Pharmacist Review  Roel Cluck notified  Clarita Leber, [place] Pharmacist Assistant [phone]

## 2020-08-02 NOTE — Telephone Encounter (Signed)
4 boxes of samples given to pt per Dr. Geraldo Pitter. Lot #[id number] Exp 11/23 Pt advised to fill out pt assistance papers.

## 2020-08-15 ENCOUNTER — Other Ambulatory Visit: Payer: Self-pay | Admitting: Physician Assistant

## 2020-08-21 ENCOUNTER — Encounter: Payer: Self-pay | Admitting: Physician Assistant

## 2020-08-21 ENCOUNTER — Other Ambulatory Visit: Payer: Self-pay

## 2020-08-21 ENCOUNTER — Ambulatory Visit (INDEPENDENT_AMBULATORY_CARE_PROVIDER_SITE_OTHER): Payer: Medicare Other | Admitting: Physician Assistant

## 2020-08-21 VITALS — BP 110/68 | HR 86 | Temp 97.5°F | Ht 65.0 in | Wt 233.4 lb

## 2020-08-21 DIAGNOSIS — I110 Hypertensive heart disease with heart failure: Secondary | ICD-10-CM

## 2020-08-21 DIAGNOSIS — I5032 Chronic diastolic (congestive) heart failure: Secondary | ICD-10-CM

## 2020-08-21 DIAGNOSIS — E782 Mixed hyperlipidemia: Secondary | ICD-10-CM

## 2020-08-21 DIAGNOSIS — K219 Gastro-esophageal reflux disease without esophagitis: Secondary | ICD-10-CM

## 2020-08-21 DIAGNOSIS — E559 Vitamin D deficiency, unspecified: Secondary | ICD-10-CM

## 2020-08-21 NOTE — Progress Notes (Signed)
Established Patient Office Visit  Subjective:  Patient ID: Miranda Moses, female    DOB: 11/14/1940  Age: 80 y.o. MRN: [id number]  CC:  Chief Complaint  Patient presents with  . Hypertension    37M F/U    HPI [facility] presents for chronic follow up hyperlipidemia  Mixed hyperlipidemia  Pt presents with hyperlipidemia.  Compliance with treatment has been good; The patient is compliant with medications, maintains a low cholesterol diet , follows up as directed ,  . The patient denies experiencing any hypercholesterolemia related symptoms. . Pt currently on atorvastatin 20mg  qd  Pt presents for follow up of hypertension.  The patient is tolerating the medication well without side effects. Compliance with treatment has been good; including taking medication as directed , maintains a healthy diet and regular exercise regimen , and following up as directed.  Pt currently on metoprolol 25mg  bid   Pt has a history of chronic diastolic CHF - she is trying to do a low sodium diet and currently on lasix 40mg  daily as needed  Pt with history of afib - is currently following with cardiology- she is currently on Eliquis 5mg  bid (also has history of PE in 07/2019) and digoxin 0.125mg  qd    Past Medical History:  Diagnosis Date  . Angina pectoris ([place]) 02/18/2018  . CAD in native artery    by CT coronary 10/2018 >> medical therapy   . CHF (congestive heart failure), NYHA class I, chronic, diastolic ([place]) 09/24/4740  . Chronic diastolic (congestive) heart failure ([place])   . Coronary artery disease of native artery of native heart with stable angina pectoris ([place]) 08/09/2019  . COVID-19 virus infection   . Family history of coronary artery disease 01/30/2018  . Gastro-esophageal reflux disease without esophagitis 01/30/2018  . GERD (gastroesophageal reflux disease)   . History of pulmonary embolism 08/23/2019  . Hyperlipidemia   . Hypertension   . Hypertensive heart disease with heart failure ([place])  01/30/2018  . Mixed hyperlipidemia 02/18/2018  . New onset atrial fibrillation ([place])   . PAD (peripheral artery disease) ([place]) 08/28/2018  . PAH (pulmonary artery hypertension) ([place]) 08/28/2018  . Pre-diabetes   . Pulmonary embolus ([place])   . Pulmonary hypertension ([place])   . Vitamin D deficiency 08/09/2019    Past Surgical History:  Procedure Laterality Date  . ABDOMINAL HYSTERECTOMY    . CATARACT EXTRACTION Left 03/2014  . CATARACT EXTRACTION Right 05/2014  . COLONOSCOPY  11/05/2013    Family History  Problem Relation Age of Onset  . Heart disease Father     Social History   Socioeconomic History  . Marital status: Widowed    Spouse name: Not on file  . Number of children: 1  . Years of education: Not on file  . Highest education level: Not on file  Occupational History  . Occupation: retired  Tobacco Use  . Smoking status: Never Smoker  . Smokeless tobacco: Never Used  Vaping Use  . Vaping Use: Never used  Substance and Sexual Activity  . Alcohol use: Never  . Drug use: Never  . Sexual activity: Not on file  Other Topics Concern  . Not on file  Social History Narrative  . Not on file   Social Determinants of Health   Financial Resource Strain: Not on file  Food Insecurity: No Food Insecurity  . Worried About Charity fundraiser in the Last Year: Never true  . Ran Out of Food in the Last Year: Never  true  Transportation Needs: No Transportation Needs  . Lack of Transportation (Medical): No  . Lack of Transportation (Non-Medical): No  Physical Activity: Not on file  Stress: Not on file  Social Connections: Not on file  Intimate Partner Violence: Not on file     Current Outpatient Medications:  .  apixaban (ELIQUIS) 5 MG TABS tablet, Take 5 mg by mouth 2 (two) times daily., Disp: , Rfl:  .  Ascorbic Acid (VITAMIN C) 100 MG tablet, Take 100 mg by mouth daily., Disp: , Rfl:  .  aspirin EC 81 MG tablet, Take 81 mg by mouth daily., Disp: , Rfl:  .  atorvastatin  (LIPITOR) 20 MG tablet, TAKE 1 TABLET(20 MG) BY MOUTH EVERY DAY, Disp: 90 tablet, Rfl: 1 .  benzonatate (TESSALON) 100 MG capsule, Take 2 capsules (200 mg total) by mouth 3 (three) times daily as needed for cough., Disp: 20 capsule, Rfl: 0 .  calcium carbonate (OSCAL) 1500 (600 Ca) MG TABS tablet, Take 600 mg of elemental calcium by mouth daily., Disp: , Rfl:  .  Cholecalciferol (VITAMIN D3 PO), Take 2 capsules by mouth daily., Disp: , Rfl:  .  furosemide (LASIX) 40 MG tablet, Take 1 tablet (40 mg total) by mouth daily as needed for edema., Disp: 90 tablet, Rfl: 1 .  isosorbide mononitrate (IMDUR) 30 MG 24 hr tablet, TAKE 1 TABLET(30 MG) BY MOUTH DAILY, Disp: 90 tablet, Rfl: 1 .  loratadine (CLARITIN) 10 MG tablet, Take 1 tablet (10 mg total) by mouth daily., Disp: 90 tablet, Rfl: 1 .  Multiple Vitamin (MULTIVITAMIN WITH MINERALS) TABS tablet, Take 1 tablet by mouth daily., Disp: , Rfl:  .  Multiple Vitamins-Minerals (OCUVITE PRESERVISION PO), Take 1 tablet by mouth in the morning and at bedtime., Disp: , Rfl:  .  omeprazole (PRILOSEC) 20 MG capsule, TAKE ONE CAPSULE BY MOUTH EVERY DAY, Disp: 180 capsule, Rfl: 0 .  OVER THE COUNTER MEDICATION, Take 1 tablet by mouth daily. Circulation vitamin from QVC -, Disp: , Rfl:  .  triamcinolone (NASACORT) 55 MCG/ACT AERO nasal inhaler, Place 1 spray into the nose daily as needed (allergies)., Disp: , Rfl:  .  vitamin E 200 UNIT capsule, Take 200 Units by mouth daily., Disp: , Rfl:  .  metoprolol tartrate (LOPRESSOR) 25 MG tablet, Take 1 tablet (25 mg total) by mouth 2 (two) times daily., Disp: 180 tablet, Rfl: 1   Allergies  Allergen Reactions  . Clarithromycin     Numbness of extremities  . Codeine Other (See Comments)    Caused pain  . Penicillins Rash    Did it involve swelling of the face/tongue/throat, SOB, or low BP? No Did it involve sudden or severe rash/hives, skin peeling, or any reaction on the inside of your mouth or nose? Yes Did you need  to seek medical attention at a hospital or doctor's office? Yes When did it last happen?20 years If all above answers are "NO", may proceed with cephalosporin use.     ROS CONSTITUTIONAL: Negative for chills, fatigue, fever, unintentional weight gain and unintentional weight loss.  E/N/T: Negative for ear pain, nasal congestion and sore throat.  CARDIOVASCULAR: Negative for chest pain, dizziness, palpitations and pedal edema.  RESPIRATORY: Negative for recent cough and dyspnea.  GASTROINTESTINAL: Negative for abdominal pain, acid reflux symptoms, constipation, diarrhea, nausea and vomiting.  MSK: Negative for arthralgias and myalgias.  INTEGUMENTARY: Negative for rash.  NEUROLOGICAL: Negative for dizziness and headaches.  PSYCHIATRIC: Negative for sleep disturbance and  to question depression screen.  Negative for depression, negative for anhedonia.            Objective:    PHYSICAL EXAM:   VS: BP 110/68 (BP Location: Right Leg, Patient Position: Sitting, Cuff Size: Normal)   Pulse 86   Temp (!) 97.5 F (36.4 C) (Temporal)   Ht 5\' 5"  (1.651 m)   Wt 233 lb 6.4 oz (105.9 kg)   SpO2 95%   BMI 38.84 kg/m   PHYSICAL EXAM:   VS: BP 110/68 (BP Location: Right Leg, Patient Position: Sitting, Cuff Size: Normal)   Pulse 86   Temp (!) 97.5 F (36.4 C) (Temporal)   Ht 5\' 5"  (1.651 m)   Wt 233 lb 6.4 oz (105.9 kg)   SpO2 95%   BMI 38.84 kg/m   GEN: Well nourished, well developed, in no acute distress  Cardiac: RRR; no murmurs, rubs, or gallops,no edema -  Respiratory:  normal respiratory rate and pattern with no distress - normal breath sounds with no rales, rhonchi, wheezes or rubs MS: no deformity or atrophy  Skin: warm and dry, no rash  Neuro:  Alert and Oriented x 3, Strength and sensation are intact - CN II-Xii grossly intact Psych: euthymic mood, appropriate affect and demeanor       Health Maintenance Due  Topic Date Due  . TETANUS/TDAP  Never done  .  COVID-19 Vaccine (3 - Booster for Pfizer series) 06/29/2020    There are no preventive care reminders to display for this patient.  Lab Results  Component Value Date   TSH 1.600 02/10/2020   Lab Results  Component Value Date   WBC 4.8 02/10/2020   HGB 11.7 02/10/2020   HCT 37.8 02/10/2020   MCV 85 02/10/2020   PLT 190 02/10/2020   Lab Results  Component Value Date   NA 139 02/10/2020   K 4.1 02/10/2020   CO2 22 02/10/2020   GLUCOSE 147 (H) 02/10/2020   BUN 15 02/10/2020   CREATININE 0.95 02/10/2020   BILITOT 0.7 02/10/2020   ALKPHOS 111 02/10/2020   AST 13 02/10/2020   ALT 14 02/10/2020   PROT 6.2 02/10/2020   ALBUMIN 3.8 02/10/2020   CALCIUM 9.1 02/10/2020   ANIONGAP 10 07/29/2019   Lab Results  Component Value Date   CHOL 147 02/10/2020   Lab Results  Component Value Date   HDL 39 (L) 02/10/2020   Lab Results  Component Value Date   LDLCALC 86 02/10/2020   Lab Results  Component Value Date   TRIG 122 02/10/2020   Lab Results  Component Value Date   CHOLHDL 3.8 02/10/2020   Lab Results  Component Value Date   HGBA1C 7.1 (H) 02/10/2020      Assessment & Plan:   Problem List Items Addressed This Visit      Cardiovascular and Mediastinum   Hypertensive heart disease with heart failure ([place]) - Primary   Relevant Medications   apixaban (ELIQUIS) 5 MG TABS tablet   Other Relevant Orders   CBC with Differential/Platelet   Comprehensive metabolic panel   CHF (congestive heart failure), NYHA class I, chronic, diastolic ([facility])   Relevant Medications   apixaban (ELIQUIS) 5 MG TABS tablet     Digestive   GERD (gastroesophageal reflux disease)     Other   Mixed hyperlipidemia   Relevant Medications   apixaban (ELIQUIS) 5 MG TABS tablet   Other Relevant Orders   Lipid panel   Vitamin D deficiency  Relevant Orders   VITAMIN D 25 Hydroxy (Vit-D Deficiency, Fractures)      No orders of the defined types were placed in this  encounter.   Follow-up: Return in about 6 months (around 02/18/2021) for chronic fasting follow up.    SARA R DAVIS, PA-C

## 2020-08-22 LAB — CBC WITH DIFFERENTIAL/PLATELET
Basophils Absolute: 0 10*3/uL (ref 0.0–0.2)
Basos: 1 %
EOS (ABSOLUTE): 0.2 10*3/uL (ref 0.0–0.4)
Eos: 4 %
Hematocrit: 42.2 % (ref 34.0–46.6)
Hemoglobin: 13.3 g/dL (ref 11.1–15.9)
Immature Grans (Abs): 0 10*3/uL (ref 0.0–0.1)
Immature Granulocytes: 0 %
Lymphocytes Absolute: 1.2 10*3/uL (ref 0.7–3.1)
Lymphs: 28 %
MCH: 25.9 pg — ABNORMAL LOW (ref 26.6–33.0)
MCHC: 31.5 g/dL (ref 31.5–35.7)
MCV: 82 fL (ref 79–97)
Monocytes Absolute: 0.5 10*3/uL (ref 0.1–0.9)
Monocytes: 12 %
Neutrophils Absolute: 2.4 10*3/uL (ref 1.4–7.0)
Neutrophils: 55 %
Platelets: 180 10*3/uL (ref 150–450)
RBC: 5.14 x10E6/uL (ref 3.77–5.28)
RDW: 15.6 % — ABNORMAL HIGH (ref 11.7–15.4)
WBC: 4.3 10*3/uL (ref 3.4–10.8)

## 2020-08-22 LAB — COMPREHENSIVE METABOLIC PANEL
ALT: 13 IU/L (ref 0–32)
AST: 17 IU/L (ref 0–40)
Albumin/Globulin Ratio: 1.6 (ref 1.2–2.2)
Albumin: 4.2 g/dL (ref 3.7–4.7)
Alkaline Phosphatase: 117 IU/L (ref 44–121)
BUN/Creatinine Ratio: 24 (ref 12–28)
BUN: 22 mg/dL (ref 8–27)
Bilirubin Total: 0.9 mg/dL (ref 0.0–1.2)
CO2: 23 mmol/L (ref 20–29)
Calcium: 10 mg/dL (ref 8.7–10.3)
Chloride: 104 mmol/L (ref 96–106)
Creatinine, Ser: 0.93 mg/dL (ref 0.57–1.00)
Globulin, Total: 2.6 g/dL (ref 1.5–4.5)
Glucose: 101 mg/dL — ABNORMAL HIGH (ref 65–99)
Potassium: 4.4 mmol/L (ref 3.5–5.2)
Sodium: 142 mmol/L (ref 134–144)
Total Protein: 6.8 g/dL (ref 6.0–8.5)
eGFR: 63 mL/min/{1.73_m2} (ref >59)

## 2020-08-22 LAB — LIPID PANEL
Chol/HDL Ratio: 2.9 ratio (ref 0.0–4.4)
Cholesterol, Total: 158 mg/dL (ref 100–199)
HDL: 54 mg/dL (ref >39)
LDL Chol Calc (NIH): 87 mg/dL (ref 0–99)
Triglycerides: 91 mg/dL (ref 0–149)
VLDL Cholesterol Cal: 17 mg/dL (ref 5–40)

## 2020-08-22 LAB — VITAMIN D 25 HYDROXY (VIT D DEFICIENCY, FRACTURES): Vit D, 25-Hydroxy: 69.4 ng/mL (ref 30.0–100.0)

## 2020-08-22 LAB — CARDIOVASCULAR RISK ASSESSMENT

## 2020-08-23 NOTE — Progress Notes (Addendum)
Chronic Care Management Pharmacy Note  08/28/2020 Name:  Miranda Moses MRN:  [id number] DOB:  June 17, 1941  Plan Recommendations:   Patient has $480 deductible on her part D plan. Her copayment of Eliquis will be $42 thereafter. Patient aware and will meet requirements of Eliquis out of pocket expense with first fill of medication. Pharmacist coordinating application for patient assistance.   Patient's eligible for goal LDL of <55 - she plans to increase activity. Please consider increasing statin dose if LDL remains above goal with next lab results.   Subjective: Miranda Moses is an 80 y.o. year old female who is a primary patient of Miranda Moses, [place].  The CCM team was consulted for assistance with disease management and care coordination needs.    Engaged with patient by telephone for follow up visit in response to provider referral for pharmacy case management and/or care coordination services.   Consent to Services:  The patient was given information about Chronic Care Management services, agreed to services, and gave verbal consent prior to initiation of services.  Please see initial visit note for detailed documentation.   Patient Care Team: Miranda Moses, Miranda Moses as PCP - General (Physician Assistant) Burnice Logan, [facility] as Pharmacist (Pharmacist)  Recent office visits: 08/21/2020 - labs stable. No medication changes.  07/31/2020 - viral upper respiratory infection - loratadine, benzonotate, mucinex and flonase.  Recent consult visits: None since last ccm visit   Hospital visits: None in previous 6 months  Objective:  Lab Results  Component Value Date   CREATININE 0.93 08/21/2020   BUN 22 08/21/2020   GFRNONAA 58 (L) 02/10/2020   GFRAA 67 02/10/2020   NA 142 08/21/2020   K 4.4 08/21/2020   CALCIUM 10.0 08/21/2020   CO2 23 08/21/2020    Lab Results  Component Value Date/Time   HGBA1C 7.1 (H) 02/10/2020 09:57 AM   HGBA1C 6.7 (H) 11/08/2019 10:10 AM    Last diabetic Eye  exam: No results found for: HMDIABEYEEXA  Last diabetic Foot exam: No results found for: HMDIABFOOTEX   Lab Results  Component Value Date   CHOL 158 08/21/2020   HDL 54 08/21/2020   LDLCALC 87 08/21/2020   TRIG 91 08/21/2020   CHOLHDL 2.9 08/21/2020    Hepatic Function Latest Ref Rng & Units 08/21/2020 02/10/2020 11/08/2019  Total Protein 6.0 - 8.5 g/dL 6.8 6.2 6.0  Albumin 3.7 - 4.7 g/dL 4.2 3.8 3.8  AST 0 - 40 IU/L 17 13 18   ALT 0 - 32 IU/L 13 14 17   Alk Phosphatase 44 - 121 IU/L 117 111 100  Total Bilirubin 0.0 - 1.2 mg/dL 0.9 0.7 0.6  Bilirubin, Direct 0.00 - 0.40 mg/dL - - -    Lab Results  Component Value Date/Time   TSH 1.600 02/10/2020 09:57 AM   TSH 1.360 10/08/2019 10:01 AM    CBC Latest Ref Rng & Units 08/21/2020 02/10/2020 11/08/2019  WBC 3.4 - 10.8 x10E3/uL 4.3 4.8 5.4  Hemoglobin 11.1 - 15.9 g/dL 13.3 11.7 11.6  Hematocrit 34.0 - 46.6 % 42.2 37.8 37.3  Platelets 150 - 450 x10E3/uL 180 190 194    Lab Results  Component Value Date/Time   VD25OH 69.4 08/21/2020 10:19 AM   VD25OH 51.7 02/10/2020 09:57 AM    Clinical ASCVD: Yes  The 10-year ASCVD risk score Mikey Bussing DC Jr., et al., 2013) is: 19.5%   Values used to calculate the score:     Age: 80 years     Sex: Female  Is Non-Hispanic African American: No     Diabetic: No     Tobacco smoker: No     Systolic Blood Pressure: 267 mmHg     Is BP treated: Yes     HDL Cholesterol: 54 mg/dL     Total Cholesterol: 158 mg/dL    No flowsheet data found.   CHA2DS2-VASc Score = 7  The patient's score is based upon: CHF History: Yes HTN History: Yes Diabetes History: Yes Stroke History: No Vascular Disease History: Yes Age Score: 2 Gender Score: 1   Social History   Tobacco Use  Smoking Status Never Smoker  Smokeless Tobacco Never Used   BP Readings from Last 3 Encounters:  08/21/20 110/68  02/16/20 (!) 108/52  02/10/20 108/62   Pulse Readings from Last 3 Encounters:  08/21/20 86  02/16/20 60   02/10/20 66   Wt Readings from Last 3 Encounters:  08/21/20 233 lb 6.4 oz (105.9 kg)  07/31/20 234 lb (106.1 kg)  02/16/20 234 lb (106.1 kg)    Assessment/Interventions: Review of patient past medical history, allergies, medications, health status, including review of consultants reports, laboratory and other test data, was performed as part of comprehensive evaluation and provision of chronic care management services.   SDOH:  (Social Determinants of Health) assessments and interventions performed: Yes   CCM Care Plan  Allergies  Allergen Reactions  . Clarithromycin     Numbness of extremities  . Codeine Other (See Comments)    Caused pain  . Penicillins Rash    Did it involve swelling of the face/tongue/throat, SOB, or low BP? No Did it involve sudden or severe rash/hives, skin peeling, or any reaction on the inside of your mouth or nose? Yes Did you need to seek medical attention at a hospital or doctor's office? Yes When did it last happen?20 years If all above answers are "NO", may proceed with cephalosporin use.     Medications Reviewed Today    Reviewed by Burnice Logan, [facility] (Pharmacist) on 08/24/20 at 1012  Med List Status: <None>  Medication Order Taking? Sig Documenting Provider Last Dose Status Informant  apixaban (ELIQUIS) 5 MG TABS tablet [id number] Yes Take 5 mg by mouth 2 (two) times daily. [provider] Taking Active   Ascorbic Acid (VITAMIN C) 100 MG tablet [id number] Yes Take 100 mg by mouth daily. [provider] Taking Active   aspirin EC 81 MG tablet [id number] Yes Take 81 mg by mouth daily. [provider] Taking Active Self  atorvastatin (LIPITOR) 20 MG tablet [id number] Yes TAKE 1 TABLET(20 MG) BY MOUTH EVERY DAY Miranda Duncans, PA-C Taking Active   benzonatate (TESSALON) 100 MG capsule [id number] Yes Take 2 capsules (200 mg total) by mouth 3 (three) times daily as needed for cough. Rip Harbour, NP Taking Active    calcium carbonate (OSCAL) 1500 (600 Ca) MG TABS tablet [id number] Yes Take 600 mg of elemental calcium by mouth daily. [provider] Taking Active   Cholecalciferol (VITAMIN D3 PO) [id number] Yes Take 2 capsules by mouth daily. [provider] Taking Active Self  furosemide (LASIX) 40 MG tablet [id number] Yes Take 1 tablet (40 mg total) by mouth daily as needed for edema. Miranda Duncans, PA-C Taking Active   isosorbide mononitrate (IMDUR) 30 MG 24 hr tablet [id number] Yes TAKE 1 TABLET(30 MG) BY MOUTH DAILY [name] Hilton Cork, MD Taking Active   loratadine (CLARITIN) 10 MG tablet [id number] Yes Take 1 tablet (10 mg total) by mouth daily.  Rip Harbour, NP Taking Active   metoprolol tartrate (LOPRESSOR) 25 MG tablet [id number] Yes Take 1 tablet (25 mg total) by mouth 2 (two) times daily. Richardo Priest, MD Taking Expired 06/02/20 2359   Multiple Vitamin (MULTIVITAMIN WITH MINERALS) TABS tablet [id number] Yes Take 1 tablet by mouth daily. [provider] Taking Active Self  Multiple Vitamins-Minerals (OCUVITE PRESERVISION PO) [id number] Yes Take 1 tablet by mouth in the morning and at bedtime. [provider] Taking Active   omeprazole (PRILOSEC) 20 MG capsule [id number] Yes TAKE ONE CAPSULE BY MOUTH EVERY DAY Miranda Duncans, PA-C Taking Active   OVER THE COUNTER MEDICATION [id number] Yes Take 1 tablet by mouth daily. Circulation vitamin from [facility] - [provider] Taking Active   triamcinolone (NASACORT) 55 MCG/ACT AERO nasal inhaler [id number] Yes Place 1 spray into the nose daily as needed (allergies). [provider] Taking Active Self  vitamin E 200 UNIT capsule [id number] Yes Take 200 Units by mouth daily. [provider] Taking Active           Patient Active Problem List   Diagnosis Date Noted  . Pulmonary hypertension ([place])   . Pre-diabetes   . Hypertension   . Hyperlipidemia   . GERD (gastroesophageal reflux disease)   . Chronic  diastolic (congestive) heart failure ([place])   . CAD in native artery   . Prediabetes 02/10/2020  . Other fatigue 02/10/2020  . Other specified menopausal and perimenopausal disorders 02/10/2020  . History of pulmonary embolism 08/23/2019  . CHF (congestive heart failure), NYHA class I, chronic, diastolic ([place]) 70/62/3762  . Vitamin D deficiency 08/09/2019  . Coronary artery disease of native artery of native heart with stable angina pectoris ([place]) 08/09/2019  . New onset atrial fibrillation ([place])   . Pulmonary embolus ([place])   . COVID-19 virus infection   . PAH (pulmonary artery hypertension) ([place]) 08/28/2018  . PAD (peripheral artery disease) ([place]) 08/28/2018  . Angina pectoris ([place]) 02/18/2018  . Mixed hyperlipidemia 02/18/2018  . Family history of coronary artery disease 01/30/2018  . Hypertensive heart disease with heart failure ([place]) 01/30/2018  . Gastro-esophageal reflux disease without esophagitis 01/30/2018    Immunization History  Administered Date(s) Administered  . Fluad Quad(high Dose 65+) 03/23/2020  . Influenza-Unspecified 03/13/2017, 06/24/2018, 05/07/2019  . PFIZER(Purple Top)SARS-COV-2 Vaccination 12/09/2019, 12/28/2019  . Pneumococcal Conjugate-13 04/13/2014  . Pneumococcal-Unspecified 05/31/2009  . Zoster 02/11/2014    Conditions to be addressed/monitored:  Heart Failure, Coronary Artery Disease and GERD  Care Plan : CCM Pharmacy Care Plan  Updates made by Burnice Logan, RPH since 08/28/2020 12:00 AM    Problem: afib, cad, chf, gerd   Priority: High  Onset Date: 08/24/2020    Long-Range Goal: Disease Management   Start Date: 08/24/2020  Expected End Date: 08/24/2021  This Visit's Progress: On track  Priority: High  Note:    Current Barriers:  . Unable to independently afford treatment regimen  Pharmacist Clinical Goal(s):  Marland Kitchen Over the next 90 days, patient will verbalize ability to afford treatment regimen through collaboration with PharmD and provider.    Interventions: . 1:1 collaboration with Miranda Duncans, PA-C regarding development and update of comprehensive plan of care as evidenced by provider attestation and co-signature . Inter-disciplinary care team collaboration (see longitudinal plan of care) . Comprehensive medication review performed; medication list updated in electronic medical record  Hyperlipidemia: (LDL goal < 55) -Not ideally controlled -Current treatment: . Atorvastatin 20 mg daily  . Aspirin ec 81 mg  daily  -Medications previously tried: none reported  -Current dietary patterns: avoids salt. Eats out. Doesn't have a huge appetite. Likes fish and cheeseburgers from restaurants.  -Current exercise habits: no regular.  -Educated on Cholesterol goals;  Benefits of statin for ASCVD risk reduction; Importance of limiting foods high in cholesterol; Exercise goal of 150 minutes per week; -Counseled on diet and exercise extensively Recommended consider increasing atorvastatin 40 mg daily if cholesterol not reduced at next labs. Patient has not been exercising but plans to try to increase activity.   Diabetes (A1c goal <7%) -Controlled -Current medications: . Diet and lifestyle  -Medications previously tried: none  -Current home glucose readings . fasting glucose: doesn't check . post prandial glucose: doesn't check -Denies hypoglycemic/hyperglycemic symptoms -Current meal patterns:  . breakfast: eggs  . Lunch and dinner: enjoys grabbing a hamburger with her grandson   -Current exercise: no regular exercise  -Educated onA1c and blood sugar goals; Exercise goal of 150 minutes per week; Carbohydrate counting and/or plate method -Counseled to check feet daily and get yearly eye exams -Counseled on diet and exercise extensively  Atrial Fibrillation (Goal: prevent stroke and major bleeding) -Controlled -CHADSVASC: 7 -Current treatment: . Rate control: metoprolol tartrate 25 mg bid . Anticoagulation: Eliquis 5 mg  bid  -Medications previously tried: none reported -Home BP and HR readings: pulse 66 bpm   -Counseled on increased risk of stroke due to Afib and benefits of anticoagulation for stroke prevention; importance of adherence to anticoagulant exactly as prescribed; avoidance of NSAIDs due to increased bleeding risk with anticoagulants; -Recommended to continue current medication Collaborated with [facility] to fill Eliquis. Patient aware that copay and deductible will be $522 this year. We will be able to complete patient assistance at that time due to meting 3% out of pocket requirement. Patient will stop in office to sign completed Eliquis form and bring proof of social security income and out of pocket cost on medications for 2022. Pharmacist faxed prior authorization to Dr. Joya Gaskins office for patient.  Prior authorization approved 08/28/2020. Pharmacist updated patient via phone and requested fill of medication through [place].   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 furosemide 40 mg daily prn edema . Isosorbide mn 30 mg daily  . Metoprolol tartrate 25 mg bid -Medications previously tried: none reported  -Current home BP/HR readings: in frequently checking but 110/55 and 107/63 mmHg -Current dietary habits: eats out several times each week. Doesn't salt food. Minimal appetite.  -Current exercise habits: no regular exercise -Educated on Benefits of medications for managing symptoms and prolonging life Importance of weighing daily; if you gain more than 3 pounds in one day or 5 pounds in one week, contact provider Importance of blood pressure control -Counseled on diet and exercise extensively Recommended to continue current medication     Osteoporosis / Osteopenia (Goal minimize risk  of fractures/breaks) -Controlled -Last DEXA Scan: 09/21   T-Score femoral neck: -1.4  T-Score forearm radius: -1.8  10-year probability of major osteoporotic fracture: 17.7  10-year probability of hip fracture: 8.8 -Patient is a candidate for pharmacologic treatment due to T-Score -1.0 to -2.5 and 10-year risk of hip fracture > 3% -Current treatment  . Vitamin D daily  . Calcium 600 mg daily  -Medications previously tried: none reported  -Recommend [id number] units of vitamin D daily. Recommend 1200 mg of  calcium daily from dietary and supplemental sources. Recommend weight-bearing and muscle strengthening exercises for building and maintaining bone density. -Counseled on diet and exercise extensively Recommended Prolia but patient declined at this time. Discussed importance of calcium citrate due to PPI use.   GERD (Goal: minimize symptoms of reflux ) -Controlled -Current treatment  . Omeprazole 20 mg daily  -Medications previously tried: none reported  -Counseled on small meals, avoiding tight waistbands and elevating head of bed.    Patient Goals/Self-Care Activities . Over the next 90 days, patient will:  - take medications as prescribed focus on medication adherence by using pill box target a minimum of 150 minutes of moderate intensity exercise weekly  Follow Up Plan: Telephone follow up appointment with care management team member scheduled for: 02/2021      Medication Assistance: Application for [place]  medication assistance program. in process.  Anticipated assistance start date uncertain .  See plan of care for additional detail. Patient was denied Low Income Subsidy. Pharmacist coordinating application for Eliquis patient assistance through [employer].   Patient's preferred pharmacy is:  [id number] [address], [address] [place] [address] Phone:  [phone] Fax: [phone]  Uses pill box? Yes Pt endorses good compliance but indicates she has forgotten to take medication out of her pill box at night occasionally. We discussed keeping it in a location where she will see it easily. Patient also encouraged to set a reminder alarm to take medication if needed.   We discussed: Current pharmacy is preferred with insurance plan and patient is satisfied with pharmacy services Patient decided to: Continue current medication management strategy  Care Plan and Follow Up Patient Decision:  Patient agrees to Care Plan and Follow-up.  Plan: Telephone follow up appointment with care management team member scheduled for:  02/2021

## 2020-08-24 ENCOUNTER — Other Ambulatory Visit: Payer: Self-pay

## 2020-08-24 ENCOUNTER — Telehealth: Payer: Self-pay

## 2020-08-24 ENCOUNTER — Ambulatory Visit (INDEPENDENT_AMBULATORY_CARE_PROVIDER_SITE_OTHER): Payer: Medicare Other

## 2020-08-24 DIAGNOSIS — I110 Hypertensive heart disease with heart failure: Secondary | ICD-10-CM

## 2020-08-24 DIAGNOSIS — E782 Mixed hyperlipidemia: Secondary | ICD-10-CM

## 2020-08-24 DIAGNOSIS — I25118 Atherosclerotic heart disease of native coronary artery with other forms of angina pectoris: Secondary | ICD-10-CM

## 2020-08-24 DIAGNOSIS — K219 Gastro-esophageal reflux disease without esophagitis: Secondary | ICD-10-CM

## 2020-08-24 NOTE — Telephone Encounter (Signed)
PA started on CMM for Eliquis 5 mg. Key  [id number]

## 2020-08-24 NOTE — Patient Instructions (Addendum)
Visit Information  Goals Addressed            This Visit's Progress   . Learn More About My Health       Timeframe:  Long-Range Goal Priority:  High Start Date:         08/24/2020                    Expected End Date:           08/24/2021             Follow Up Date 02/28/2021    - tell my story and reason for my visit - repeat what I heard to make sure I understand - bring a list of my medicines to the visit - speak up when I don't understand    Why is this important?    The best way to learn about your health and care is by talking to the doctor and nurse.   They will answer your questions and give you information in the way that you like best.    Notes:     . Lifestyle Change-Hypertension       Timeframe:  Long-Range Goal Priority:  High Start Date:        08/24/2020                     Expected End Date:     08/24/2021                  Follow Up Date 02/28/2021    - agree to work together to make changes - ask questions to understand    Why is this important?    The changes that you are asked to make may be hard to do.   This is especially true when the changes are life-long.   Knowing why it is important to you is the first step.   Working on the change with your family or support person helps you not feel alone.   Reward yourself and family or support person when goals are met. This can be an activity you choose like bowling, hiking, biking, swimming or shooting hoops.     Notes:     . Track and Manage Symptoms-Heart Failure       Timeframe:  Long-Range Goal Priority:  High Start Date:      08/24/2020           Expected End Date:  08/24/2021               Follow Up Date  02/28/2021       - begin a heart failure diary - eat more whole grains, fruits and vegetables, lean meats and healthy fats - know when to call the doctor - track symptoms and what helps feel better or worse    Why is this important?    You will be able to handle your symptoms  better if you keep track of them.   Making some simple changes to your lifestyle will help.   Eating healthy is one thing you can do to take good care of yourself.    Notes:       Patient Care Plan: CCM Pharmacy Care Plan    Problem Identified: afib, cad, chf, gerd   Priority: High  Onset Date: 08/24/2020    Long-Range Goal: Disease Management   Start Date: 08/24/2020  Expected End Date: 08/24/2021  This Visit's Progress: On track  Priority:  High  Note:    Current Barriers:  . Unable to independently afford treatment regimen  Pharmacist Clinical Goal(s):  Marland Kitchen Over the next 90 days, patient will verbalize ability to afford treatment regimen through collaboration with PharmD and provider.   Interventions: . 1:1 collaboration with [name], PA-C regarding development and update of comprehensive plan of care as evidenced by provider attestation and co-signature . Inter-disciplinary care team collaboration (see longitudinal plan of care) . Comprehensive medication review performed; medication list updated in electronic medical record  Hyperlipidemia: (LDL goal < 55) -Not ideally controlled -Current treatment: . Atorvastatin 20 mg daily  . Aspirin ec 81 mg daily  -Medications previously tried: none reported  -Current dietary patterns: avoids salt. Eats out. Doesn't have a huge appetite. Likes fish and cheeseburgers from restaurants.  -Current exercise habits: no regular.  -Educated on Cholesterol goals;  Benefits of statin for ASCVD risk reduction; Importance of limiting foods high in cholesterol; Exercise goal of 150 minutes per week; -Counseled on diet and exercise extensively Recommended consider increasing atorvastatin 40 mg daily if cholesterol not reduced at next labs. Patient has not been exercising but plans to try to increase activity.   Diabetes (A1c goal <7%) -Controlled -Current medications: . Diet and lifestyle  -Medications previously tried: none  -Current  home glucose readings . fasting glucose: doesn't check . post prandial glucose: doesn't check -Denies hypoglycemic/hyperglycemic symptoms -Current meal patterns:  . breakfast: eggs  . Lunch and dinner: enjoys grabbing a hamburger with her grandson   -Current exercise: no regular exercise  -Educated onA1c and blood sugar goals; Exercise goal of 150 minutes per week; Carbohydrate counting and/or plate method -Counseled to check feet daily and get yearly eye exams -Counseled on diet and exercise extensively  Atrial Fibrillation (Goal: prevent stroke and major bleeding) -Controlled -CHADSVASC: 7 -Current treatment: . Rate control: metoprolol tartrate 25 mg bid . Anticoagulation: Eliquis 5 mg bid  -Medications previously tried: none reported -Home BP and HR readings: pulse 66 bpm   -Counseled on increased risk of stroke due to Afib and benefits of anticoagulation for stroke prevention; importance of adherence to anticoagulant exactly as prescribed; avoidance of NSAIDs due to increased bleeding risk with anticoagulants; -Recommended to continue current medication Collaborated with [facility] to fill Eliquis. Patient aware that copay and deductible will be $522 this year. We will be able to complete patient assistance at that time due to meting 3% out of pocket requirement. Patient will stop in office to sign completed Eliquis form and bring proof of social security income and out of pocket cost on medications for 2022. Pharmacist faxed prior authorization to Dr. Joya Gaskins office for patient.    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 furosemide 40 mg daily prn edema . Isosorbide mn 30 mg daily  . Metoprolol tartrate 25 mg bid -Medications previously tried: none reported  -Current home  BP/HR readings: in frequently checking but 110/55 and 107/63 mmHg -Current dietary habits: eats out several times each week. Doesn't salt food. Minimal appetite.  -Current exercise habits: no regular exercise -Educated on Benefits of medications for managing symptoms and prolonging life Importance of weighing daily; if you gain more than 3 pounds in one day or 5 pounds in one week, contact provider Importance of blood pressure control -Counseled on diet and exercise extensively Recommended to continue current medication  Osteoporosis / Osteopenia (Goal minimize risk of fractures/breaks) -Controlled -Last DEXA Scan: 09/21   T-Score femoral neck: -1.4  T-Score forearm radius: -1.8  10-year probability of major osteoporotic fracture: 17.7  10-year probability of hip fracture: 8.8 -Patient is a candidate for pharmacologic treatment due to T-Score -1.0 to -2.5 and 10-year risk of hip fracture > 3% -Current treatment  . Vitamin D daily  . Calcium 600 mg daily  -Medications previously tried: none reported  -Recommend [phone] units of vitamin D daily. Recommend 1200 mg of calcium daily from dietary and supplemental sources. Recommend weight-bearing and muscle strengthening exercises for building and maintaining bone density. -Counseled on diet and exercise extensively Recommended Prolia but patient declined at this time. Discussed importance of calcium citrate due to PPI use.   GERD (Goal: minimize symptoms of reflux ) -Controlled -Current treatment  . Omeprazole 20 mg daily  -Medications previously tried: none reported  -Counseled on small meals, avoiding tight waistbands and elevating head of bed.    Patient Goals/Self-Care Activities . Over the next 90 days, patient will:  - take medications as prescribed focus on medication adherence by using pill box target a minimum of 150 minutes of moderate intensity exercise weekly  Follow Up Plan: Telephone follow up appointment with  care management team member scheduled for: 02/2021      The patient verbalized understanding of instructions, educational materials, and care plan provided today and declined offer to receive copy of patient instructions, educational materials, and care plan.  Telephone follow up appointment with pharmacy team member scheduled for: 02/28/2021  Burnice Logan, [facility]  Exercises to do While Sitting  Exercises that you do while sitting (chair exercises) can give you many of the same benefits as full exercise. Benefits include strengthening your heart, burning calories, and keeping muscles and joints healthy. Exercise can also improve your mood and help with depression and anxiety. You may benefit from chair exercises if you are unable to do standing exercises because of:  Diabetic foot pain.  Obesity.  Illness.  Arthritis.  Recovery from surgery or injury.  Breathing problems.  Balance problems.  Another type of disability. Before starting chair exercises, check with your health care provider or a physical therapist to find out how much exercise you can tolerate and which exercises are safe for you. If your health care provider approves:  Start out slowly and build up over time. Aim to work up to about 10-20 minutes for each exercise session.  Make exercise part of your daily routine.  Drink water when you exercise. Do not wait until you are thirsty. Drink every 10-15 minutes.  Stop exercising right away if you have pain, nausea, shortness of breath, or dizziness.  If you are exercising in a wheelchair, make sure to lock the wheels.  Ask your health care provider whether you can do tai chi or yoga. Many positions in these mind-body exercises can be modified to do while seated. Warm-up Before starting other exercises: 1. Sit up as straight as you can. Have your knees bent at 90 degrees, which is the shape of the capital letter "L." Keep your feet flat on the floor. 2. Sit at the  front edge of your chair, if you can. 3. Pull in (tighten) the muscles in your abdomen and stretch your spine and neck as straight as you can. Hold this position for a few minutes. 4. Breathe in and out evenly. Try to concentrate on your breathing, and relax your mind. Stretching  Exercise A: Arm stretch 1. Hold your arms out straight in front of your body. 2. Bend your hands at the wrist with your fingers pointing up, as if signaling someone to stop. Notice the slight tension in your forearms as you hold the position. 3. Keeping your arms out and your hands bent, rotate your hands outward as far as you can and hold this stretch. Aim to have your thumbs pointing up and your pinkie fingers pointing down. Slowly repeat arm stretches for one minute as tolerated. Exercise B: Leg stretch 1. If you can move your legs, try to "draw" letters on the floor with the toes of your foot. Write your name with one foot. 2. Write your name with the toes of your other foot. Slowly repeat the movements for one minute as tolerated. Exercise C: Reach for the sky 1. Reach your hands as far over your head as you can to stretch your spine. 2. Move your hands and arms as if you are climbing a rope. Slowly repeat the movements for one minute as tolerated. Range of motion exercises Exercise A: Shoulder roll 1. Let your arms hang loosely at your sides. 2. Lift just your shoulders up toward your ears, then let them relax back down. 3. When your shoulders feel loose, rotate your shoulders in backward and forward circles. Do shoulder rolls slowly for one minute as tolerated. Exercise B: March in place 1. As if you are marching, pump your arms and lift your legs up and down. Lift your knees as high as you can. ? If you are unable to lift your knees, just pump your arms and move your ankles and feet up and down. March in place for one minute as tolerated. Exercise C: Seated jumping jacks 1. Let your arms hang down  straight. 2. Keeping your arms straight, lift them up over your head. Aim to point your fingers to the ceiling. 3. While you lift your arms, straighten your legs and slide your heels along the floor to your sides, as wide as you can. 4. As you bring your arms back down to your sides, slide your legs back together. ? If you are unable to use your legs, just move your arms. Slowly repeat seated jumping jacks for one minute as tolerated. Strengthening exercises Exercise A: Shoulder squeeze 1. Hold your arms straight out from your body to your sides, with your elbows bent and your fists pointed at the ceiling. 2. Keeping your arms in the bent position, move them forward so your elbows and forearms meet in front of your face. 3. Open your arms back out as wide as you can with your elbows still bent, until you feel your shoulder blades squeezing together. Hold for 5 seconds. Slowly repeat the movements forward and backward for one minute as tolerated. Contact a health care provider if you:  Had to stop exercising due to any of the following: ? Pain. ? Nausea. ? Shortness of breath. ? Dizziness. ? Fatigue.  Have significant pain or soreness after exercising. Get help right away if you have:  Chest pain.  Difficulty breathing. These symptoms may represent a serious problem that is an emergency. Do not wait to see if the symptoms will go away. Get medical help right away. Call your local emergency services (911 in the [place] Do not drive yourself to the hospital. This information is not intended to replace advice given to you by your health care provider. Make sure you discuss any questions you  have with your health care provider. Document Revised: 10/07/2019 Document Reviewed: 10/07/2019 Elsevier Patient Education  2021 [employer].

## 2020-08-25 ENCOUNTER — Telehealth: Payer: Self-pay

## 2020-08-25 NOTE — Telephone Encounter (Signed)
[employer] left message stating pt's appeal was overturned. Eliquis 5mg  has now been approved for pt. Through 07/18/20-06/23/21. [employer] will be sending written documentation within three business days.   Royce Macadamia, [place] 08/25/20 11:59 AM

## 2020-08-28 ENCOUNTER — Other Ambulatory Visit: Payer: Self-pay | Admitting: Physician Assistant

## 2020-09-14 ENCOUNTER — Telehealth: Payer: Self-pay

## 2020-09-14 NOTE — Telephone Encounter (Cosign Needed)
°  Chronic Care Management   Note  09/14/2020 Name: Miranda Moses MRN: [id number] DOB: 1940-08-09   [facility] contacted pharmacist following up on patient's Eliquis application. Pharmacist verified household size over the phone and resubmitted a portion of the application that was not received. They are processing this application and will let us know if additional information is required. Patient's out of pocket expense was not as much as she expected so she plans to provide pharmacist with updated out of pocket proof after next fill of Eliquis. Patient must have spent 3% of income on medication before Eliquis can be supplied.   Sherre Poot, PharmD, [facility] Clinical Pharmacist [facility] [phone] (office) [phone] (mobile)

## 2020-09-18 ENCOUNTER — Telehealth: Payer: Self-pay

## 2020-09-18 ENCOUNTER — Other Ambulatory Visit: Payer: Self-pay | Admitting: Cardiology

## 2020-09-18 NOTE — Progress Notes (Signed)
    Chronic Care Management Pharmacy Assistant   Name: Miranda Moses  MRN: [id number] DOB: 1940/09/25   Reason for Encounter: Adherence reveiw    Medications: Outpatient Encounter Medications as of 09/18/2020  Medication Sig  . apixaban (ELIQUIS) 5 MG TABS tablet Take 5 mg by mouth 2 (two) times daily.  . Ascorbic Acid (VITAMIN C) 100 MG tablet Take 100 mg by mouth daily.  Marland Kitchen aspirin EC 81 MG tablet Take 81 mg by mouth daily.  Marland Kitchen atorvastatin (LIPITOR) 20 MG tablet TAKE 1 TABLET(20 MG) BY MOUTH EVERY DAY  . benzonatate (TESSALON) 100 MG capsule Take 2 capsules (200 mg total) by mouth 3 (three) times daily as needed for cough.  . calcium carbonate (OSCAL) 1500 (600 Ca) MG TABS tablet Take 600 mg of elemental calcium by mouth daily.  . Cholecalciferol (VITAMIN D3 PO) Take 2 capsules by mouth daily.  . furosemide (LASIX) 40 MG tablet TAKE 1 TABLET(40 MG) BY MOUTH DAILY AS NEEDED FOR SWELLING  . isosorbide mononitrate (IMDUR) 30 MG 24 hr tablet TAKE 1 TABLET(30 MG) BY MOUTH DAILY  . loratadine (CLARITIN) 10 MG tablet Take 1 tablet (10 mg total) by mouth daily.  . metoprolol tartrate (LOPRESSOR) 25 MG tablet Take 1 tablet (25 mg total) by mouth 2 (two) times daily.  . Multiple Vitamin (MULTIVITAMIN WITH MINERALS) TABS tablet Take 1 tablet by mouth daily.  . Multiple Vitamins-Minerals (OCUVITE PRESERVISION PO) Take 1 tablet by mouth in the morning and at bedtime.  Marland Kitchen omeprazole (PRILOSEC) 20 MG capsule TAKE ONE CAPSULE BY MOUTH EVERY DAY  . OVER THE COUNTER MEDICATION Take 1 tablet by mouth daily. Circulation vitamin from [facility] -  . triamcinolone (NASACORT) 55 MCG/ACT AERO nasal inhaler Place 1 spray into the nose daily as needed (allergies).  . vitamin E 200 UNIT capsule Take 200 Units by mouth daily.   No facility-administered encounter medications on file as of 09/18/2020.   Chart review noted the patient has appointments on: 10/03/20- AWV 02/27/21-Sara Owens Shark, [place] since  06/29/2020 (Dose 3 - Booster for [employer] series)   Previous Completions  12/28/2019  Imm Admin: PFIZER(Purple Top)SARS-COV-2 Vaccination   12/09/2019  Imm Admin: PFIZER(Purple Top)SARS-COV-2 Vaccination     Adherence review Total Gaps - All Measures 3  Total Gaps - Star Measures 0 Diabetes: Hemoglobin A1c Control (<= 9.0%) 0 Diabetes: Hemoglobin A1c Testing 0 Controlling High Blood Pressure Met: BP < 140/90 Preventive Care and Screening: Tobacco Use: Screening and Cessation Intervention - With Non-Smokers Met: Non-Tobacco User Preventive Care and Screening: Tobacco Use: Screening and Cessation Intervention 0 Preventive Care and Screening: Screening for Clinical Depression and Follow-up Plan Not Met: No Screening Breast Cancer Screening 0 Colorectal Cancer Screening 0 Visits: Wellness Bundle [facility]) Not Met: AWV not performed Visits: Annual Wellness Visit [facility]) Not Met: AWV not performed  Clarita Leber, [place] Pharmacist Assistant [phone]

## 2020-09-19 ENCOUNTER — Telehealth: Payer: Self-pay

## 2020-09-19 DIAGNOSIS — H353121 Nonexudative age-related macular degeneration, left eye, early dry stage: Secondary | ICD-10-CM | POA: Diagnosis not present

## 2020-09-19 DIAGNOSIS — H353212 Exudative age-related macular degeneration, right eye, with inactive choroidal neovascularization: Secondary | ICD-10-CM | POA: Diagnosis not present

## 2020-09-19 NOTE — Telephone Encounter (Signed)
Called patient to let her know I reschedule her appointment to 10 AM on 10/03/20 due to error made when scheduling left message for patient to call me back if she can not make it.

## 2020-09-19 NOTE — Telephone Encounter (Signed)
Refill sent to pharmacy.   

## 2020-09-20 IMAGING — DX DG CHEST 1V PORT
1 series · 1 of 1 positions shown · non-contrast
Comparison: 07/22/2019 from [facility].

CLINICAL DATA: Dyspnea.  UPVW2-DT.

EXAM:
PORTABLE CHEST 1 VIEW

[chest]
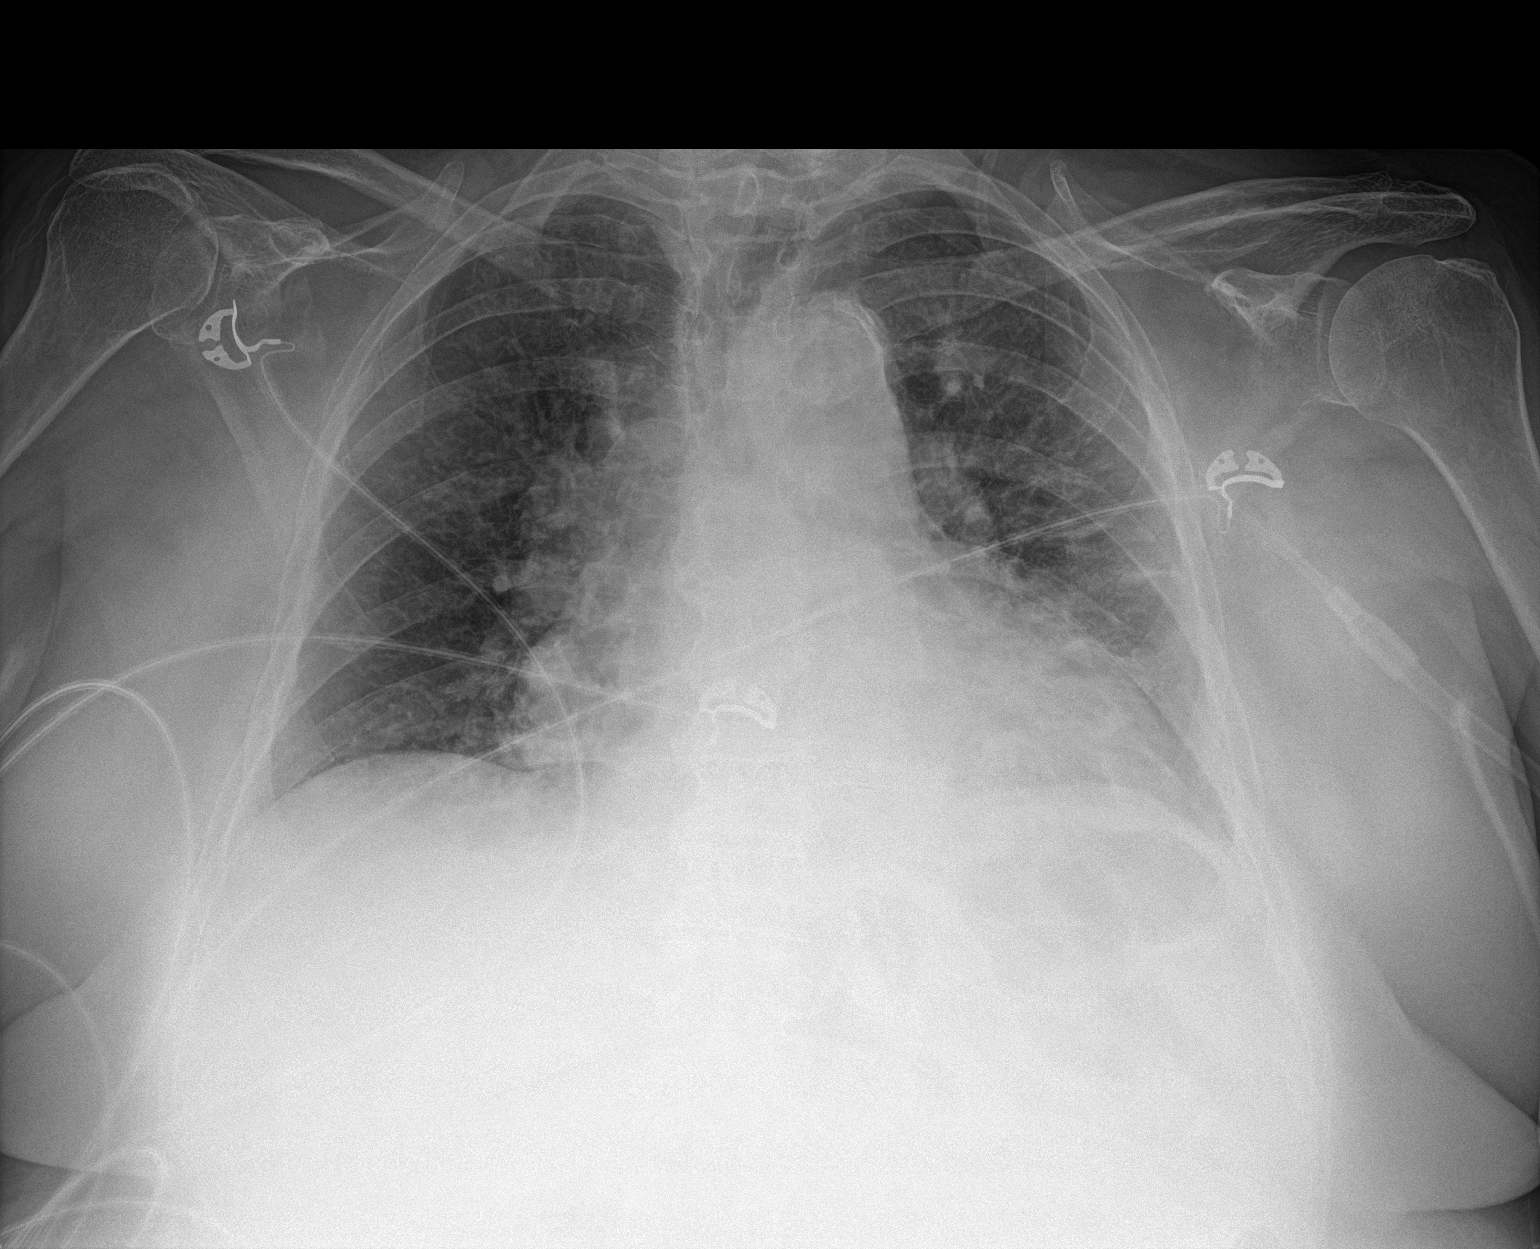

[1 of 1 positions shown; findings below may reference images not displayed]

FINDINGS: Patient rotated minimally right. Mild cardiomegaly. Atherosclerosis
in the transverse aorta. No pleural effusion or pneumothorax. No
lobar consolidation. Pulmonary interstitial thickening, felt to be
accentuated by AP portable technique and diminished lung volumes.
Areas of mild left infrahilar scarring are felt to be similar.
IMPRESSION: Cardiomegaly with diminished lung volumes, chronic interstitial
thickening. No convincing evidence of superimposed pneumonia.

Aortic Atherosclerosis ([id number].Y).

## 2020-10-03 ENCOUNTER — Ambulatory Visit: Payer: Medicare Other | Admitting: Physician Assistant

## 2020-10-03 ENCOUNTER — Ambulatory Visit (INDEPENDENT_AMBULATORY_CARE_PROVIDER_SITE_OTHER): Payer: Medicare Other | Admitting: Physician Assistant

## 2020-10-03 ENCOUNTER — Encounter: Payer: Self-pay | Admitting: Physician Assistant

## 2020-10-03 ENCOUNTER — Other Ambulatory Visit: Payer: Self-pay

## 2020-10-03 VITALS — BP 110/68 | HR 80 | Temp 97.4°F | Ht 65.0 in | Wt 239.0 lb

## 2020-10-03 DIAGNOSIS — Z0000 Encounter for general adult medical examination without abnormal findings: Secondary | ICD-10-CM

## 2020-10-03 DIAGNOSIS — Z23 Encounter for immunization: Secondary | ICD-10-CM

## 2020-10-03 HISTORY — DX: Encounter for general adult medical examination without abnormal findings: Z00.00

## 2020-10-03 HISTORY — DX: Encounter for immunization: Z23

## 2020-10-03 MED ORDER — TETANUS-DIPHTH-ACELL PERTUSSIS 5-2-15.5 LF-MCG/0.5 IM SUSP: 0.5000 mL | Freq: Once | INTRAMUSCULAR | 0 refills | Status: DC

## 2020-10-03 MED ORDER — ATORVASTATIN CALCIUM 20 MG PO TABS: ORAL_TABLET | ORAL | 1 refills | Status: DC

## 2020-10-03 NOTE — Patient Instructions (Signed)
Preventive Care 61 Years and Older, Female Preventive care refers to lifestyle choices and visits with your health care provider that can promote health and wellness. This includes:  A yearly physical exam. This is also called an annual wellness visit.  Regular dental and eye exams.  Immunizations.  Screening for certain conditions.  Healthy lifestyle choices, such as: ? Eating a healthy diet. ? Getting regular exercise. ? Not using drugs or products that contain nicotine and tobacco. ? Limiting alcohol use. What can I expect for my preventive care visit? Physical exam Your health care provider will check your:  Height and weight. These may be used to calculate your BMI (body mass index). BMI is a measurement that tells if you are at a healthy weight.  Heart rate and blood pressure.  Body temperature.  Skin for abnormal spots. Counseling Your health care provider may ask you questions about your:  Past medical problems.  Family's medical history.  Alcohol, tobacco, and drug use.  Emotional well-being.  Home life and relationship well-being.  Sexual activity.  Diet, exercise, and sleep habits.  History of falls.  Memory and ability to understand (cognition).  Work and work Statistician.  Pregnancy and menstrual history.  Access to firearms. What immunizations do I need? Vaccines are usually given at various ages, according to a schedule. Your health care provider will recommend vaccines for you based on your age, medical history, and lifestyle or other factors, such as travel or where you work.   What tests do I need? Blood tests  Lipid and cholesterol levels. These may be checked every 5 years, or more often depending on your overall health.  Hepatitis C test.  Hepatitis B test. Screening  Lung cancer screening. You may have this screening every year starting at age 74 if you have a 30-pack-year history of smoking and currently smoke or have quit within  the past 15 years.  Colorectal cancer screening. ? All adults should have this screening starting at age 44 and continuing until age 58. ? Your health care provider may recommend screening at age 2 if you are at increased risk. ? You will have tests every 1-10 years, depending on your results and the type of screening test.  Diabetes screening. ? This is done by checking your blood sugar (glucose) after you have not eaten for a while (fasting). ? You may have this done every 1-3 years.  Mammogram. ? This may be done every 1-2 years. ? Talk with your health care provider about how often you should have regular mammograms.  Abdominal aortic aneurysm (AAA) screening. You may need this if you are a current or former smoker.  BRCA-related cancer screening. This may be done if you have a family history of breast, ovarian, tubal, or peritoneal cancers. Other tests  STD (sexually transmitted disease) testing, if you are at risk.  Bone density scan. This is done to screen for osteoporosis. You may have this done starting at age 104. Talk with your health care provider about your test results, treatment options, and if necessary, the need for more tests. Follow these instructions at home: Eating and drinking  Eat a diet that includes fresh fruits and vegetables, whole grains, lean protein, and low-fat dairy products. Limit your intake of foods with high amounts of sugar, saturated fats, and salt.  Take vitamin and mineral supplements as recommended by your health care provider.  Do not drink alcohol if your health care provider tells you not to drink.  If you drink alcohol: ? Limit how much you have to 0-1 drink a day. ? Be aware of how much alcohol is in your drink. In the [place] one drink equals one 12 oz bottle of beer (355 mL), one 5 oz glass of wine (148 mL), or one 1 oz glass of hard liquor (44 mL).   Lifestyle  Take daily care of your teeth and gums. Brush your teeth every morning  and night with fluoride toothpaste. Floss one time each day.  Stay active. Exercise for at least 30 minutes 5 or more days each week.  Do not use any products that contain nicotine or tobacco, such as cigarettes, e-cigarettes, and chewing tobacco. If you need help quitting, ask your health care provider.  Do not use drugs.  If you are sexually active, practice safe sex. Use a condom or other form of protection in order to prevent STIs (sexually transmitted infections).  Talk with your health care provider about taking a low-dose aspirin or statin.  Find healthy ways to cope with stress, such as: ? Meditation, yoga, or listening to music. ? Journaling. ? Talking to a trusted person. ? Spending time with friends and family. Safety  Always wear your seat belt while driving or riding in a vehicle.  Do not drive: ? If you have been drinking alcohol. Do not ride with someone who has been drinking. ? When you are tired or distracted. ? While texting.  Wear a helmet and other protective equipment during sports activities.  If you have firearms in your house, make sure you follow all gun safety procedures. What's next?  Visit your health care provider once a year for an annual wellness visit.  Ask your health care provider how often you should have your eyes and teeth checked.  Stay up to date on all vaccines. This information is not intended to replace advice given to you by your health care provider. Make sure you discuss any questions you have with your health care provider. Document Revised: 05/31/2020 Document Reviewed: 06/04/2018 Elsevier Patient Education  2021 Elsevier Inc.  

## 2020-10-03 NOTE — Progress Notes (Signed)
Subjective:  Patient ID: Miranda Moses, female    DOB: [birth year]  Age: 80 y.o. MRN: [id number]  Chief Complaint  Patient presents with  . Annual Exam    HPI Encounter for general adult medical examination without abnormal findings  Physical ("At Risk" items are starred): Patient's last physical exam was 1 year ago .  Smoking: Life-long non-smoker ;  Physical Activity: Exercises at least 3 times per week ;  Alcohol/Drug Use: Is a non-drinker ; No illicit drug use ;  Patient is not afflicted from Stress Incontinence and Urge Incontinence  Safety: reviewed ; Patient wears a seat belt, has smoke detectors, has carbon monoxide detectors, practices appropriate gun safety, and wears sunscreen with extended sun exposure. Dental Care: biannual cleanings, brushes and flosses daily. Ophthalmology/Optometry: Annual visit.  Hearing loss: none Vision impairments: Wears glasses  DEXA- 02/25/2020  Mammogram- 02/25/2020  Fall Risk  10/03/2020 05/12/2018  Falls in the past year? 0 0  Comment - Emmi Telephone Survey: data to providers prior to load  Number falls in past yr: 0 -  Injury with Fall? 0 -  Risk for fall due to : No Fall Risks -  Follow up Falls evaluation completed -     Depression screen [facility] 2/9 10/03/2020  Decreased Interest 0  Down, Depressed, Hopeless 0  PHQ - 2 Score 0       Functional Status Survey: Is the patient deaf or have difficulty hearing?: No Does the patient have difficulty seeing, even when wearing glasses/contacts?: No Does the patient have difficulty concentrating, remembering, or making decisions?: No Does the patient have difficulty walking or climbing stairs?: No Does the patient have difficulty dressing or bathing?: No Does the patient have difficulty doing errands alone such as visiting a doctor's office or shopping?: No   Social Hx   Social History   Socioeconomic History  . Marital status: Widowed    Spouse name: Not on file  . Number of  children: 1  . Years of education: Not on file  . Highest education level: Not on file  Occupational History  . Occupation: retired  Tobacco Use  . Smoking status: Never Smoker  . Smokeless tobacco: Never Used  Vaping Use  . Vaping Use: Never used  Substance and Sexual Activity  . Alcohol use: Never  . Drug use: Never  . Sexual activity: Not on file  Other Topics Concern  . Not on file  Social History Narrative  . Not on file   Social Determinants of Health   Financial Resource Strain: Not on file  Food Insecurity: No Food Insecurity  . Worried About Charity fundraiser in the Last Year: Never true  . Ran Out of Food in the Last Year: Never true  Transportation Needs: No Transportation Needs  . Lack of Transportation (Medical): No  . Lack of Transportation (Non-Medical): No  Physical Activity: Not on file  Stress: Not on file  Social Connections: Not on file   Past Medical History:  Diagnosis Date  . Angina pectoris ([place]) 02/18/2018  . CAD in native artery    by CT coronary 10/2018 >> medical therapy   . CHF (congestive heart failure), NYHA class I, chronic, diastolic ([place]) 03/18/4627  . Chronic diastolic (congestive) heart failure ([place])   . Coronary artery disease of native artery of native heart with stable angina pectoris ([place]) 08/09/2019  . COVID-19 virus infection   . Family history of coronary artery disease 01/30/2018  . Gastro-esophageal reflux disease  without esophagitis 01/30/2018  . GERD (gastroesophageal reflux disease)   . History of pulmonary embolism 08/23/2019  . Hyperlipidemia   . Hypertension   . Hypertensive heart disease with heart failure ([place]) 01/30/2018  . Mixed hyperlipidemia 02/18/2018  . New onset atrial fibrillation ([place])   . PAD (peripheral artery disease) ([place]) 08/28/2018  . PAH (pulmonary artery hypertension) ([place]) 08/28/2018  . Pre-diabetes   . Pulmonary embolus ([place])   . Pulmonary hypertension ([place])   . Vitamin D deficiency 08/09/2019   Family  History  Problem Relation Age of Onset  . Heart disease Father     Review of Systems CONSTITUTIONAL: Negative for chills, fatigue, fever, unintentional weight gain and unintentional weight loss.  E/N/T: Negative for ear pain, nasal congestion and sore throat.  CARDIOVASCULAR: Negative for chest pain, dizziness, palpitations and pedal edema.  RESPIRATORY: Negative for recent cough and dyspnea.  GASTROINTESTINAL: Negative for abdominal pain, acid reflux symptoms, constipation, diarrhea, nausea and vomiting.  MSK: Negative for arthralgias and myalgias.  INTEGUMENTARY: Negative for rash.  NEUROLOGICAL: Negative for dizziness and headaches.  PSYCHIATRIC: Negative for sleep disturbance and to question depression screen.  Negative for depression, negative for anhedonia.       Objective:  BP 110/68   Pulse 80   Temp (!) 97.4 F (36.3 C)   Ht _0  (1.651 m)   Wt 239 lb (108.4 kg)   SpO2 100%   BMI 39.77 kg/m   BP/Weight 10/03/2020 9/47/0962 01/25/6628  Systolic BP 476 546 -  Diastolic BP 68 68 -  Wt. (Lbs) 239 233.4 234  BMI 39.77 38.84 38.94    Physical Exam PHYSICAL EXAM:   VS: BP 110/68   Pulse 80   Temp (!) 97.4 F (36.3 C)   Ht _1  (1.651 m)   Wt 239 lb (108.4 kg)   SpO2 100%   BMI 39.77 kg/m   GEN: Well nourished, well developed, in no acute distress  Cardiac: RRR; no murmurs, rubs, or gallops,no edema -  Respiratory:  normal respiratory rate and pattern with no distress - normal breath sounds with no rales, rhonchi, wheezes or rubs GI: normal bowel sounds, no masses or tenderness MS: no deformity or atrophy  Skin: warm and dry, no rash  Neuro:  Alert and Oriented x 3, Strength and sensation are intact - CN II-Xii grossly intact Psych: euthymic mood, appropriate affect and demeanor  Lab Results  Component Value Date   WBC 4.3 08/21/2020   HGB 13.3 08/21/2020   HCT 42.2 08/21/2020   PLT 180 08/21/2020   GLUCOSE 101 (H) 08/21/2020   CHOL 158 08/21/2020    TRIG 91 08/21/2020   HDL 54 08/21/2020   LDLCALC 87 08/21/2020   ALT 13 08/21/2020   AST 17 08/21/2020   NA 142 08/21/2020   K 4.4 08/21/2020   CL 104 08/21/2020   CREATININE 0.93 08/21/2020   BUN 22 08/21/2020   CO2 23 08/21/2020   TSH 1.600 02/10/2020   HGBA1C 7.1 (H) 02/10/2020      Assessment & Plan:  1. Health maintenance examination Continue current meds and follow up as directed 2. Encounter for vaccination - Tdap (ADACEL) 10-23-13.5 LF-MCG/0.5 injection; Inject 0.5 mLs into the muscle once for 1 dose.  Dispense: 0.5 mL; Refill: 0 - Varicella-zoster vaccine IM (Shingrix)    Meds ordered this encounter  Medications  . atorvastatin (LIPITOR) 20 MG tablet    Sig: TAKE 1 TABLET(20 MG) BY MOUTH EVERY DAY  Dispense:  90 tablet    Refill:  1  . Tdap (ADACEL) 10-23-13.5 LF-MCG/0.5 injection    Sig: Inject 0.5 mLs into the muscle once for 1 dose.    Dispense:  0.5 mL    Refill:  0    Order Specific Question:   Supervising Provider    AnswerShelton Silvas    These are the goals we discussed: Goals    . Learn More About My Health     Timeframe:  Long-Range Goal Priority:  High Start Date:         08/24/2020                    Expected End Date:           08/24/2021             Follow Up Date 02/28/2021    - tell my story and reason for my visit - repeat what I heard to make sure I understand - bring a list of my medicines to the visit - speak up when I don't understand    Why is this important?    The best way to learn about your health and care is by talking to the doctor and nurse.   They will answer your questions and give you information in the way that you like best.    Notes:     . Lifestyle Change-Hypertension     Timeframe:  Long-Range Goal Priority:  High Start Date:        08/24/2020                     Expected End Date:     08/24/2021                  Follow Up Date 02/28/2021    - agree to work together to make changes - ask  questions to understand    Why is this important?    The changes that you are asked to make may be hard to do.   This is especially true when the changes are life-long.   Knowing why it is important to you is the first step.   Working on the change with your family or support person helps you not feel alone.   Reward yourself and family or support person when goals are met. This can be an activity you choose like bowling, hiking, biking, swimming or shooting hoops.     Notes:     . Pharmacy Care Plan     CARE PLAN ENTRY (see longitudinal plan of care for additional care plan information)  Current Barriers:  . Chronic Disease Management support, education, and care coordination needs related to Hypertension and Coronary Artery Disease   Hypertension BP Readings from Last 3 Encounters:  02/16/20 (!) 108/52  02/10/20 108/62  11/08/19 138/82   . Pharmacist Clinical Goal(s): o Over the next 90 days, patient will work with PharmD and providers to maintain BP goal <130/80 . Current regimen:   Furosemide 40 mg daily prn edema  Isosorbide mn 30 mg daily   Metoprolol tartrate 25 mg bid . Interventions: o Discussed healthy diet choices and incorporating more vegetables in diet.  o Discussed goals of exercise. Encouraged patient to work up slowly to >150 minutes each week on exercise bike.  o Reviewed home blood pressure reading.  . Patient self care activities - Over the next 90 days, patient will:  o Check BP monthly, document, and provide at future appointments o Ensure daily salt intake < 2300 mg/day  Hyperlipidemia/CAD Lab Results  Component Value Date/Time   LDLCALC 86 02/10/2020 09:57 AM   . Pharmacist Clinical Goal(s): o Over the next 90 days, patient will work with PharmD and providers to achieve LDL goal < 70 . Current regimen:  . aspirin EC 81 mg daily . Atorvastatin 20 mg daily . Interventions: o Discussed healthy diet and exercise goals.  o Encouraged patient  to begin working on >150 minutes on exercise bike each week of moderate exercise.  o Updated lipid panel scheduled for 08/22/2019. Will review at next visit.   . Patient self care activities - Over the next 90 days, patient will: o Continue to take medication daily as prescribed.  o Work on [employer] and increasing exercise.   Diabetes Lab Results  Component Value Date/Time   HGBA1C 7.1 (H) 02/10/2020 09:57 AM   HGBA1C 6.7 (H) 11/08/2019 10:10 AM   . Pharmacist Clinical Goal(s): o Over the next 90 days, patient will work with PharmD and providers to achieve A1c goal <7% . Current regimen:  o Diet and Exercise . Interventions: o Discussed importance of healthy diet and monitoring carbohydrate and sugar intake.  o Encouraged patient the importance of exercise in managing elevated blood sugar. Discussed goal of 150 minutes each week.  o Discussed the benefit of weight loss in managing/preventing diabetes.  . Patient self care activities - Over the next 90 days, patient will: o Patient plans to watch diet more closely after Christmas. Will work on increasing her time on exercise bike and balancing her plate.   Afib . Pharmacist Clinical Goal(s) o Over the next 90 days, patient will work with PharmD and providers to manage Afib and prevent blood clots . Current regimen:  o Eliquis 5 mg bid  o Metoprolol tartrate 25 mg bid  . Interventions: o Reviewed cost of Eliquis and options to make more affordable.  o Encouraged patient to apply for Extra help on medicare to make medications more affordable. Provided patient [facility] phone number to request an application ([phone]).  o Reviewed patient assistance application requirements to receive Eliquis through [employer]. Patient doesn't feel that she will meet the 3% out of pocket requirement. Patient will call pharmacist if that changes.  o Pharmacist coordinated 1 month of Eliquis samples to be picked up in the office at  patient convenience.  . Patient self care activities - Over the next 90 days, patient will: o Fill out application for Medicare Extra Help.  o Contact pharmacist if needs to proceed with patient assistance.  o Pick up samples from office for additional month of Eliquis.   Medication management . Pharmacist Clinical Goal(s): o Over the next 90 days, patient will work with PharmD and providers to maintain optimal medication adherence . Current pharmacy: [facility] . Interventions o Comprehensive medication review performed. o Continue current medication management strategy.  . Patient self care activities - Over the next 90 days, patient will: o Focus on medication adherence by continuing to use pill box o Take medications as prescribed o Report any questions or concerns to PharmD and/or provider(s)  Initial goal documentation     . Track and Manage Symptoms-Heart Failure     Timeframe:  Long-Range Goal Priority:  High Start Date:      08/24/2020           Expected End Date:  08/24/2021  Follow Up Date  02/28/2021       - begin a heart failure diary - eat more whole grains, fruits and vegetables, lean meats and healthy fats - know when to call the doctor - track symptoms and what helps feel better or worse    Why is this important?    You will be able to handle your symptoms better if you keep track of them.   Making some simple changes to your lifestyle will help.   Eating healthy is one thing you can do to take good care of yourself.    Notes:         This is a list of the screening recommended for you and due dates:  Health Maintenance  Topic Date Due  . Tetanus Vaccine  Never done  . Flu Shot  01/22/2021  . Mammogram  02/24/2021  . DEXA scan (bone density measurement)  Completed  . Pneumonia vaccines  Completed  . HPV Vaccine  Aged Out  . COVID-19 Vaccine  Discontinued  .  Hepatitis C: One time screening is recommended by Center for Disease  Control  (CDC) for  adults born from [birth year] through 1963.   Discontinued     AN INDIVIDUALIZED CARE PLAN: was established or reinforced today.   SELF MANAGEMENT: The patient and I together assessed ways to personally work towards obtaining the recommended goals  Support needs The patient and/or family needs were assessed and services were offered and not necessary at this time.    Follow-up: Return for as scheduled.  Yetta Flock [facility] [phone]

## 2020-11-03 ENCOUNTER — Telehealth: Payer: Self-pay

## 2020-11-03 NOTE — Progress Notes (Addendum)
Chronic Care Management Pharmacy Assistant   Name: Hania Cerone  MRN: [id number] DOB: [birth year]   Reason for Encounter: Disease State for general adherence  Recent office visits:  10/03/20-Sara Rosana Hoes PA-C-annual exam, vaccine, Tdap (ADACEL) 10-23-13.5 LF-MCG/0.5 injection; Inject 0.5 mLs into the muscle once for 1 dose.  Dispense: 0.5 mL; Refill: 0  Varicella-zoster vaccine IM (Shingrix)   08/25/20-[employer] left message stating pt's appeal was overturned. Eliquis 5mg  has now been approved for pt. Through 07/18/20-06/23/21. [employer] will be sending written documentation within three business days  08/24/20-Sara Brown CPP, Patient has $480 deductible on her part D plan. Her copayment of Eliquis will be $42 thereafter. Patient aware and will meet requirements of Eliquis out of pocket expense with first fill of medication. Pharmacist coordinating application for patient assistance.  Patient's eligible for goal LDL of <55 - she plans to increase activity. Please consider increasing statin dose if LDL remains above goal with next lab results  Recent consult visits:  none  Hospital visits:  None in previous 6 months  Medications: Outpatient Encounter Medications as of 11/03/2020  Medication Sig  . apixaban (ELIQUIS) 5 MG TABS tablet Take 5 mg by mouth 2 (two) times daily.  . Ascorbic Acid (VITAMIN C) 100 MG tablet Take 100 mg by mouth daily.  Marland Kitchen aspirin EC 81 MG tablet Take 81 mg by mouth daily.  Marland Kitchen atorvastatin (LIPITOR) 20 MG tablet TAKE 1 TABLET(20 MG) BY MOUTH EVERY DAY  . benzonatate (TESSALON) 100 MG capsule Take 2 capsules (200 mg total) by mouth 3 (three) times daily as needed for cough. (Patient not taking: Reported on 10/03/2020)  . calcium carbonate (OSCAL) 1500 (600 Ca) MG TABS tablet Take 600 mg of elemental calcium by mouth daily.  . Cholecalciferol (VITAMIN D3 PO) Take 2 capsules by mouth daily.  . furosemide (LASIX) 40 MG tablet TAKE 1 TABLET(40 MG) BY MOUTH DAILY  AS NEEDED FOR SWELLING  . isosorbide mononitrate (IMDUR) 30 MG 24 hr tablet TAKE 1 TABLET(30 MG) BY MOUTH DAILY  . loratadine (CLARITIN) 10 MG tablet Take 1 tablet (10 mg total) by mouth daily.  . metoprolol tartrate (LOPRESSOR) 25 MG tablet TAKE 1 TABLET(25 MG) BY MOUTH TWICE DAILY  . Multiple Vitamin (MULTIVITAMIN WITH MINERALS) TABS tablet Take 1 tablet by mouth daily.  . Multiple Vitamins-Minerals (OCUVITE PRESERVISION PO) Take 1 tablet by mouth in the morning and at bedtime.  Marland Kitchen omeprazole (PRILOSEC) 20 MG capsule TAKE ONE CAPSULE BY MOUTH EVERY DAY  . OVER THE COUNTER MEDICATION Take 1 tablet by mouth daily. Circulation vitamin from [facility] -  . triamcinolone (NASACORT) 55 MCG/ACT AERO nasal inhaler Place 1 spray into the nose daily as needed (allergies).  . vitamin E 200 UNIT capsule Take 200 Units by mouth daily.   No facility-administered encounter medications on file as of 11/03/2020.   Spoke to patient she is doing well.  She said she can tell when her blood pressure is elevated, her ears feel full.  She stated she rarely checks her blood pressure at home, when she does she uses an arm cuff.  Patient stated she eats out more that she cooks at home, she tries to watch her diet, but not as close as she should.    Patient wants to be more active, she has a exercise bike at home and stated she needs to start using it.  She stated it is not safe to walk outside due to large vicious dog that is always  out, it has attacked her once already, luckily she was able to get away.   Patient denies any issues with her medication.  She is taking them as directed.   Patient denies any recent issues with her Afib or any other health issues.     Star Rating Drugs: Atorvastatin  10/03/20 90ds Furosemide  08/29/20  90 Isosorbide  10/16/20 90 Metoprolol  09/19/20 [place], [place] Pharmacist Assistant [phone]

## 2020-12-28 ENCOUNTER — Other Ambulatory Visit: Payer: Self-pay | Admitting: Cardiology

## 2020-12-28 ENCOUNTER — Other Ambulatory Visit: Payer: Self-pay | Admitting: Physician Assistant

## 2020-12-28 ENCOUNTER — Telehealth: Payer: Self-pay

## 2020-12-28 NOTE — Progress Notes (Signed)
Chronic Care Management Pharmacy Assistant   Name: Miranda Moses  MRN: [id number] DOB: [birth year]  Reason for Encounter: Disease State for General Adherence  Recent office visits:  10/03/20-Sara Rosana Hoes PA-C-annual exam, vaccines given in office, Tdap (ADACEL) 10-23-13.5 LF-MCG/0.5 injection; Inject 0.5 mLs into the muscle once for 1 dose.  Dispense: 0.5 mL; Refill: 0  Varicella-zoster vaccine IM (Shingrix)   Recent consult visits:  none  Hospital visits:  None in previous 6 months  Medications: Outpatient Encounter Medications as of 12/28/2020  Medication Sig   apixaban (ELIQUIS) 5 MG TABS tablet Take 5 mg by mouth 2 (two) times daily.   Ascorbic Acid (VITAMIN C) 100 MG tablet Take 100 mg by mouth daily.   aspirin EC 81 MG tablet Take 81 mg by mouth daily.   atorvastatin (LIPITOR) 20 MG tablet TAKE 1 TABLET(20 MG) BY MOUTH EVERY DAY   benzonatate (TESSALON) 100 MG capsule Take 2 capsules (200 mg total) by mouth 3 (three) times daily as needed for cough. (Patient not taking: Reported on 10/03/2020)   calcium carbonate (OSCAL) 1500 (600 Ca) MG TABS tablet Take 600 mg of elemental calcium by mouth daily.   Cholecalciferol (VITAMIN D3 PO) Take 2 capsules by mouth daily.   furosemide (LASIX) 40 MG tablet TAKE 1 TABLET(40 MG) BY MOUTH DAILY AS NEEDED FOR SWELLING   isosorbide mononitrate (IMDUR) 30 MG 24 hr tablet TAKE 1 TABLET(30 MG) BY MOUTH DAILY   loratadine (CLARITIN) 10 MG tablet Take 1 tablet (10 mg total) by mouth daily.   metoprolol tartrate (LOPRESSOR) 25 MG tablet TAKE 1 TABLET(25 MG) BY MOUTH TWICE DAILY   Multiple Vitamin (MULTIVITAMIN WITH MINERALS) TABS tablet Take 1 tablet by mouth daily.   Multiple Vitamins-Minerals (OCUVITE PRESERVISION PO) Take 1 tablet by mouth in the morning and at bedtime.   omeprazole (PRILOSEC) 20 MG capsule TAKE ONE CAPSULE BY MOUTH EVERY DAY   OVER THE COUNTER MEDICATION Take 1 tablet by mouth daily. Circulation vitamin from QVC -   triamcinolone  (NASACORT) 55 MCG/ACT AERO nasal inhaler Place 1 spray into the nose daily as needed (allergies).   vitamin E 200 UNIT capsule Take 200 Units by mouth daily.   No facility-administered encounter medications on file as of 12/28/2020.    Patient is currently taking the following medications for Hypertension:  Furosemide 40 mg daily prn edema Isosorbide mn 30 mg daily Metoprolol tartrate 25 mg bid  Hyperlipidemia Medications: aspirin EC 81 mg daily Atorvastatin 20 mg daily  Afib Medications:  Eliquis 5 mg bid Metoprolol tartrate 25 mg bid  Patient stated she is doing well, she is taking her medication as directed, she denies any issues or side effects with her medications.   Patient stated she does not check her blood pressures on a regular basis, only if she "feels funny".   Patient watches her diet, she watches her salt and sugar intake, she stated probably not as close as she should sometimes, she eats quite a bit.   She stays as active as she can inside the house, she doesn't go outside much due to dog. She stated she wants to start using exercise bike again.  Patient denies any issues with Afib flare ups.   Care Gaps: AWV: 4/212/22   Star Rating Drugs: Eliquis   12/19/20 90 (gets from PAP) Atorvastatin  10/03/20 90 Furosemide  11/21/20 21 Isosorbide  10/16/20 90 Metoprolol  09/19/20 90 (said she has refill to pick up)   Clarita Leber, [place]  Clinical Pharmacist Assistant [phone]

## 2021-01-16 DIAGNOSIS — H353212 Exudative age-related macular degeneration, right eye, with inactive choroidal neovascularization: Secondary | ICD-10-CM | POA: Diagnosis not present

## 2021-01-16 DIAGNOSIS — H43813 Vitreous degeneration, bilateral: Secondary | ICD-10-CM | POA: Diagnosis not present

## 2021-01-16 DIAGNOSIS — H353121 Nonexudative age-related macular degeneration, left eye, early dry stage: Secondary | ICD-10-CM | POA: Diagnosis not present

## 2021-02-19 ENCOUNTER — Other Ambulatory Visit: Payer: Self-pay

## 2021-02-19 ENCOUNTER — Encounter: Payer: Self-pay | Admitting: Physician Assistant

## 2021-02-19 ENCOUNTER — Ambulatory Visit (INDEPENDENT_AMBULATORY_CARE_PROVIDER_SITE_OTHER): Payer: Medicare Other | Admitting: Physician Assistant

## 2021-02-19 VITALS — BP 112/70 | HR 90 | Temp 97.2°F | Ht 65.0 in | Wt 241.0 lb

## 2021-02-19 DIAGNOSIS — K219 Gastro-esophageal reflux disease without esophagitis: Secondary | ICD-10-CM | POA: Diagnosis not present

## 2021-02-19 DIAGNOSIS — I110 Hypertensive heart disease with heart failure: Secondary | ICD-10-CM

## 2021-02-19 DIAGNOSIS — E782 Mixed hyperlipidemia: Secondary | ICD-10-CM

## 2021-02-19 DIAGNOSIS — Z23 Encounter for immunization: Secondary | ICD-10-CM | POA: Diagnosis not present

## 2021-02-19 DIAGNOSIS — E559 Vitamin D deficiency, unspecified: Secondary | ICD-10-CM | POA: Diagnosis not present

## 2021-02-19 DIAGNOSIS — R7303 Prediabetes: Secondary | ICD-10-CM

## 2021-02-19 MED ORDER — OMEPRAZOLE 20 MG PO CPDR: 20.0000 mg | DELAYED_RELEASE_CAPSULE | Freq: Every day | ORAL | 3 refills | Status: DC

## 2021-02-19 NOTE — Progress Notes (Signed)
Established Patient Office Visit  Subjective:  Patient ID: Miranda Moses, female    DOB: [birth year]  Age: 80 y.o. MRN: [id number]  CC:  Chief Complaint  Patient presents with   Hypertension    HPI [name] presents for chronic follow up hyperlipidemia  Mixed hyperlipidemia  Pt presents with hyperlipidemia.  Compliance with treatment has been good; The patient is compliant with medications, maintains a low cholesterol diet , follows up as directed ,  . The patient denies experiencing any hypercholesterolemia related symptoms. . Pt currently on atorvastatin 62m qd  Pt presents for follow up of hypertension.  The patient is tolerating the medication well without side effects. Compliance with treatment has been good; including taking medication as directed , maintains a healthy diet and regular exercise regimen , and following up as directed.  Pt currently on metoprolol 273mbid  Pt has a history of chronic diastolic CHF - she is trying to do a low sodium diet and currently on lasix 4046maily as needed - said she is trying to take this med on a regular basis  Pt with history of afib - is currently following with cardiology- she is currently on Eliquis 5mg42md (also has history of PE in 07/2019) and digoxin 0.125mg57m- she has appt next month with Dr MunleBettina Gaviawith history of GERD - requests refill of omeprazole  Pt would like flu shot today  Past Medical History:  Diagnosis Date   Angina pectoris (HCC) [place]8/2019   CAD in native artery    by CT coronary 10/2018 >> medical therapy    CHF (congestive heart failure), NYHA class I, chronic, diastolic (HCC) [place]5/3/41/9622ronic diastolic (congestive) heart failure (HCC)    Coronary artery disease of native artery of native heart with stable angina pectoris (HCC) [place]5/2021   COVID-19 virus infection    Family history of coronary artery disease 01/30/2018   Gastro-esophageal reflux disease without esophagitis 01/30/2018   GERD  (gastroesophageal reflux disease)    History of pulmonary embolism 08/23/2019   Hyperlipidemia    Hypertension    Hypertensive heart disease with heart failure (HCC) [place]/2019   Mixed hyperlipidemia 02/18/2018   New onset atrial fibrillation (HCC)    PAD (peripheral artery disease) (HCC) [place]/2020   PAH (pulmonary artery hypertension) (HCC) [place]/2020   Pre-diabetes    Pulmonary embolus (HCC) [place] ([place]itamin D deficiency 08/09/2019    Past Surgical History:  Procedure Laterality Date   ABDOMINAL HYSTERECTOMY     CATARACT EXTRACTION Left 03/2014   CATARACT EXTRACTION Right 05/2014   COLONOSCOPY  11/05/2013    Family History  Problem Relation Age of Onset   Heart disease Father     Social History   Socioeconomic History   Marital status: Widowed    Spouse name: Not on file   Number of children: 1   Years of education: Not on file   Highest education level: Not on file  Occupational History   Occupation: retired  Tobacco Use   Smoking status: Never   Smokeless tobacco: Never  Vaping Use   Vaping Use: Never used  Substance and Sexual Activity   Alcohol use: Never   Drug use: Never   Sexual activity: Not on file  Other Topics Concern   Not on file  Social History Narrative   Not on file   Social Determinants of Health   Financial Resource Strain: Not on file  Food Insecurity: No Food Insecurity   Worried About Charity fundraiser in the Last Year: Never true   Ran Out of Food in the Last Year: Never true  Transportation Needs: No Transportation Needs   Lack of Transportation (Medical): No   Lack of Transportation (Non-Medical): No  Physical Activity: Not on file  Stress: Not on file  Social Connections: Not on file  Intimate Partner Violence: Not on file     Current Outpatient Medications:    apixaban (ELIQUIS) 5 MG TABS tablet, Take 5 mg by mouth 2 (two) times daily., Disp: , Rfl:    Ascorbic Acid (VITAMIN C) 100 MG tablet, Take 100  mg by mouth daily., Disp: , Rfl:    aspirin EC 81 MG tablet, Take 81 mg by mouth daily., Disp: , Rfl:    atorvastatin (LIPITOR) 20 MG tablet, TAKE 1 TABLET(20 MG) BY MOUTH EVERY DAY, Disp: 90 tablet, Rfl: 1   benzonatate (TESSALON) 100 MG capsule, Take 2 capsules (200 mg total) by mouth 3 (three) times daily as needed for cough., Disp: 20 capsule, Rfl: 0   calcium carbonate (OSCAL) 1500 (600 Ca) MG TABS tablet, Take 600 mg of elemental calcium by mouth daily., Disp: , Rfl:    Cholecalciferol (VITAMIN D3 PO), Take 2 capsules by mouth daily., Disp: , Rfl:    furosemide (LASIX) 40 MG tablet, TAKE 1 TABLET(40 MG) BY MOUTH DAILY AS NEEDED FOR SWELLING, Disp: 90 tablet, Rfl: 1   isosorbide mononitrate (IMDUR) 30 MG 24 hr tablet, TAKE 1 TABLET BY MOUTH EVERY DAY, Disp: 90 tablet, Rfl: 1   loratadine (CLARITIN) 10 MG tablet, Take 1 tablet (10 mg total) by mouth daily., Disp: 90 tablet, Rfl: 1   metoprolol tartrate (LOPRESSOR) 25 MG tablet, TAKE 1 TABLET(25 MG) BY MOUTH TWICE DAILY, Disp: 180 tablet, Rfl: 1   Multiple Vitamin (MULTIVITAMIN WITH MINERALS) TABS tablet, Take 1 tablet by mouth daily., Disp: , Rfl:    Multiple Vitamins-Minerals (OCUVITE PRESERVISION PO), Take 1 tablet by mouth in the morning and at bedtime., Disp: , Rfl:    OVER THE COUNTER MEDICATION, Take 1 tablet by mouth daily. Circulation vitamin from QVC -, Disp: , Rfl:    triamcinolone (NASACORT) 55 MCG/ACT AERO nasal inhaler, Place 1 spray into the nose daily as needed (allergies)., Disp: , Rfl:    vitamin E 200 UNIT capsule, Take 200 Units by mouth daily., Disp: , Rfl:    omeprazole (PRILOSEC) 20 MG capsule, Take 1 capsule (20 mg total) by mouth daily., Disp: 90 capsule, Rfl: 3   Allergies  Allergen Reactions   Clarithromycin     Numbness of extremities   Codeine Other (See Comments)    Caused pain   Penicillins Rash    Did it involve swelling of the face/tongue/throat, SOB, or low BP? No Did it involve sudden or severe  rash/hives, skin peeling, or any reaction on the inside of your mouth or nose? Yes Did you need to seek medical attention at a hospital or doctor's office? Yes When did it last happen?      20 years If all above answers are "NO", may proceed with cephalosporin use.    CONSTITUTIONAL: Negative for chills, fatigue, fever, unintentional weight gain and unintentional weight loss.  E/N/T: Negative for ear pain, nasal congestion and sore throat.  CARDIOVASCULAR: Negative for chest pain, dizziness, palpitations and pedal edema.  RESPIRATORY: Negative for recent cough and dyspnea.  GASTROINTESTINAL: Negative for abdominal pain, acid reflux symptoms, constipation,  diarrhea, nausea and vomiting.  MSK: Negative for arthralgias and myalgias.  INTEGUMENTARY: Negative for rash.  NEUROLOGICAL: Negative for dizziness and headaches.  PSYCHIATRIC: Negative for sleep disturbance and to question depression screen.  Negative for depression, negative for anhedonia.             Objective:  PHYSICAL EXAM:   VS: BP 112/70 (BP Location: Left Arm, Patient Position: Sitting, Cuff Size: Normal)   Pulse 90   Temp (!) 97.2 F (36.2 C) (Temporal)   Ht 5' 5"  (1.651 m)   Wt 241 lb (109.3 kg)   SpO2 95%   BMI 40.10 kg/m   GEN: Well nourished, well developed, in no acute distress  Cardiac: RRR; no murmurs, rubs, or gallops,no edema -  Respiratory:  normal respiratory rate and pattern with no distress - normal breath sounds with no rales, rhonchi, wheezes or rubs  MS: no deformity or atrophy  Skin: warm and dry, no rash  Neuro:  Alert and Oriented x 3, - CN II-Xii grossly intact Psych: euthymic mood, appropriate affect and demeanor  Health Maintenance Due  Topic Date Due   TETANUS/TDAP  Never done   Zoster Vaccines- Shingrix (2 of 2) 12/20/2020    There are no preventive care reminders to display for this patient.  Lab Results  Component Value Date   TSH 1.600 02/10/2020   Lab Results  Component  Value Date   WBC 4.3 08/21/2020   HGB 13.3 08/21/2020   HCT 42.2 08/21/2020   MCV 82 08/21/2020   PLT 180 08/21/2020   Lab Results  Component Value Date   NA 142 08/21/2020   K 4.4 08/21/2020   CO2 23 08/21/2020   GLUCOSE 101 (H) 08/21/2020   BUN 22 08/21/2020   CREATININE 0.93 08/21/2020   BILITOT 0.9 08/21/2020   ALKPHOS 117 08/21/2020   AST 17 08/21/2020   ALT 13 08/21/2020   PROT 6.8 08/21/2020   ALBUMIN 4.2 08/21/2020   CALCIUM 10.0 08/21/2020   ANIONGAP 10 07/29/2019   EGFR 63 08/21/2020   Lab Results  Component Value Date   CHOL 158 08/21/2020   Lab Results  Component Value Date   HDL 54 08/21/2020   Lab Results  Component Value Date   LDLCALC 87 08/21/2020   Lab Results  Component Value Date   TRIG 91 08/21/2020   Lab Results  Component Value Date   CHOLHDL 2.9 08/21/2020   Lab Results  Component Value Date   HGBA1C 7.1 (H) 02/10/2020      Assessment & Plan:   Problem List Items Addressed This Visit       Cardiovascular and Mediastinum   Hypertensive heart disease with heart failure ([place])   Relevant Orders   CBC with Differential/Platelet   Comprehensive metabolic panel Continue current meds     Digestive   Gastro-esophageal reflux disease without esophagitis   Relevant Medications   omeprazole (PRILOSEC) 20 MG capsule Continue meds     Other   Mixed hyperlipidemia - Primary   Relevant Orders   Lipid panel Watch diet and continue meds   Vitamin D deficiency   Relevant Orders   VITAMIN D 25 Hydroxy (Vit-D Deficiency, Fractures)   Pre-diabetes   Relevant Orders   Hemoglobin A1c   Other Visit Diagnoses     Need for prophylactic vaccination and inoculation against influenza       Relevant Orders   Flu Vaccine QUAD High Dose(Fluad)       Meds ordered this  encounter  Medications   omeprazole (PRILOSEC) 20 MG capsule    Sig: Take 1 capsule (20 mg total) by mouth daily.    Dispense:  90 capsule    Refill:  3    Order  Specific Question:   Supervising Provider    Answer:   Shelton Silvas     Follow-up: Return in about 6 months (around 08/21/2021) for chronic fasting follow up.    SARA R Randell Teare, PA-C

## 2021-02-20 LAB — VITAMIN D 25 HYDROXY (VIT D DEFICIENCY, FRACTURES): Vit D, 25-Hydroxy: 61.1 ng/mL (ref 30.0–100.0)

## 2021-02-20 LAB — CBC WITH DIFFERENTIAL/PLATELET
Basophils Absolute: 0 10*3/uL (ref 0.0–0.2)
Basos: 1 %
EOS (ABSOLUTE): 0.2 10*3/uL (ref 0.0–0.4)
Eos: 4 %
Hematocrit: 40.5 % (ref 34.0–46.6)
Hemoglobin: 12.9 g/dL (ref 11.1–15.9)
Immature Grans (Abs): 0 10*3/uL (ref 0.0–0.1)
Immature Granulocytes: 0 %
Lymphocytes Absolute: 0.8 10*3/uL (ref 0.7–3.1)
Lymphs: 17 %
MCH: 26.9 pg (ref 26.6–33.0)
MCHC: 31.9 g/dL (ref 31.5–35.7)
MCV: 85 fL (ref 79–97)
Monocytes Absolute: 0.5 10*3/uL (ref 0.1–0.9)
Monocytes: 11 %
Neutrophils Absolute: 3.3 10*3/uL (ref 1.4–7.0)
Neutrophils: 67 %
Platelets: 191 10*3/uL (ref 150–450)
RBC: 4.79 x10E6/uL (ref 3.77–5.28)
RDW: 15.1 % (ref 11.7–15.4)
WBC: 5 10*3/uL (ref 3.4–10.8)

## 2021-02-20 LAB — HEMOGLOBIN A1C
Est. average glucose Bld gHb Est-mCnc: 134 mg/dL
Hgb A1c MFr Bld: 6.3 % — ABNORMAL HIGH (ref 4.8–5.6)

## 2021-02-20 LAB — COMPREHENSIVE METABOLIC PANEL
ALT: 12 IU/L (ref 0–32)
AST: 16 IU/L (ref 0–40)
Albumin/Globulin Ratio: 1.4 (ref 1.2–2.2)
Albumin: 3.9 g/dL (ref 3.7–4.7)
Alkaline Phosphatase: 137 IU/L — ABNORMAL HIGH (ref 44–121)
BUN/Creatinine Ratio: 20 (ref 12–28)
BUN: 20 mg/dL (ref 8–27)
Bilirubin Total: 0.8 mg/dL (ref 0.0–1.2)
CO2: 24 mmol/L (ref 20–29)
Calcium: 9.7 mg/dL (ref 8.7–10.3)
Chloride: 102 mmol/L (ref 96–106)
Creatinine, Ser: 0.99 mg/dL (ref 0.57–1.00)
Globulin, Total: 2.8 g/dL (ref 1.5–4.5)
Glucose: 113 mg/dL — ABNORMAL HIGH (ref 65–99)
Potassium: 3.9 mmol/L (ref 3.5–5.2)
Sodium: 141 mmol/L (ref 134–144)
Total Protein: 6.7 g/dL (ref 6.0–8.5)
eGFR: 58 mL/min/{1.73_m2} — ABNORMAL LOW (ref >59)

## 2021-02-20 LAB — LIPID PANEL
Chol/HDL Ratio: 2.8 ratio (ref 0.0–4.4)
Cholesterol, Total: 149 mg/dL (ref 100–199)
HDL: 53 mg/dL (ref >39)
LDL Chol Calc (NIH): 79 mg/dL (ref 0–99)
Triglycerides: 90 mg/dL (ref 0–149)
VLDL Cholesterol Cal: 17 mg/dL (ref 5–40)

## 2021-02-20 LAB — CARDIOVASCULAR RISK ASSESSMENT

## 2021-02-23 ENCOUNTER — Telehealth: Payer: Self-pay

## 2021-02-23 ENCOUNTER — Other Ambulatory Visit: Payer: Self-pay

## 2021-02-23 NOTE — Chronic Care Management (AMB) (Signed)
Chronic Care Management Pharmacy Assistant   Name: Miranda Moses  MRN: [id number] DOB: 08-28-1940  Reason for Encounter: Diabetes Mellitus Disease State Call   Recent office visits:  02/19/21- [name], PA-C- seen for follow up of hyperlipidemia, labs ordered, no medication changes, follow up 6 months  Recent consult visits:  No visits noted  Hospital visits:  None in previous 6 months  Medications: Outpatient Encounter Medications as of 02/23/2021  Medication Sig   apixaban (ELIQUIS) 5 MG TABS tablet Take 5 mg by mouth 2 (two) times daily.   Ascorbic Acid (VITAMIN C) 100 MG tablet Take 100 mg by mouth daily.   aspirin EC 81 MG tablet Take 81 mg by mouth daily.   atorvastatin (LIPITOR) 20 MG tablet TAKE 1 TABLET(20 MG) BY MOUTH EVERY DAY   benzonatate (TESSALON) 100 MG capsule Take 2 capsules (200 mg total) by mouth 3 (three) times daily as needed for cough.   calcium carbonate (OSCAL) 1500 (600 Ca) MG TABS tablet Take 600 mg of elemental calcium by mouth daily.   Cholecalciferol (VITAMIN D3 PO) Take 2 capsules by mouth daily.   furosemide (LASIX) 40 MG tablet TAKE 1 TABLET(40 MG) BY MOUTH DAILY AS NEEDED FOR SWELLING   isosorbide mononitrate (IMDUR) 30 MG 24 hr tablet TAKE 1 TABLET BY MOUTH EVERY DAY   loratadine (CLARITIN) 10 MG tablet Take 1 tablet (10 mg total) by mouth daily.   metoprolol tartrate (LOPRESSOR) 25 MG tablet TAKE 1 TABLET(25 MG) BY MOUTH TWICE DAILY   Multiple Vitamin (MULTIVITAMIN WITH MINERALS) TABS tablet Take 1 tablet by mouth daily.   Multiple Vitamins-Minerals (OCUVITE PRESERVISION PO) Take 1 tablet by mouth in the morning and at bedtime.   omeprazole (PRILOSEC) 20 MG capsule Take 1 capsule (20 mg total) by mouth daily.   OVER THE COUNTER MEDICATION Take 1 tablet by mouth daily. Circulation vitamin from QVC -   triamcinolone (NASACORT) 55 MCG/ACT AERO nasal inhaler Place 1 spray into the nose daily as needed (allergies).   vitamin E 200 UNIT capsule Take  200 Units by mouth daily.   No facility-administered encounter medications on file as of 02/23/2021.    Recent Relevant Labs: Lab Results  Component Value Date/Time   HGBA1C 6.3 (H) 02/19/2021 10:43 AM   HGBA1C 7.1 (H) 02/10/2020 09:57 AM    Kidney Function Lab Results  Component Value Date/Time   CREATININE 0.99 02/19/2021 10:43 AM   CREATININE 0.93 08/21/2020 10:19 AM   GFRNONAA 58 (L) 02/10/2020 09:57 AM   GFRAA 67 02/10/2020 09:57 AM     Recent Office Vitals: BP Readings from Last 3 Encounters:  02/19/21 112/70  10/03/20 110/68  08/21/20 110/68   Pulse Readings from Last 3 Encounters:  02/19/21 90  10/03/20 80  08/21/20 86    Wt Readings from Last 3 Encounters:  02/19/21 241 lb (109.3 kg)  10/03/20 239 lb (108.4 kg)  08/21/20 233 lb 6.4 oz (105.9 kg)     Kidney Function Lab Results  Component Value Date/Time   CREATININE 0.99 02/19/2021 10:43 AM   CREATININE 0.93 08/21/2020 10:19 AM   GFRNONAA 58 (L) 02/10/2020 09:57 AM   GFRAA 67 02/10/2020 09:57 AM    BMP Latest Ref Rng & Units 02/19/2021 08/21/2020 02/10/2020  Glucose 65 - 99 mg/dL 113(H) 101(H) 147(H)  BUN 8 - 27 mg/dL '20 22 15  '$ Creatinine 0.57 - 1.00 mg/dL 0.99 0.93 0.95  BUN/Creat Ratio 12 - '28 20 24 16  '$ Sodium 134 -  144 mmol/L 141 142 139  Potassium 3.5 - 5.2 mmol/L 3.9 4.4 4.1  Chloride 96 - 106 mmol/L 102 104 104  CO2 20 - 29 mmol/L '24 23 22  '$ Calcium 8.7 - 10.3 mg/dL 9.7 10.0 9.1    Current antihypertensive regimen:  furosemide 40 mg daily prn edema Patient stated she has increased this medication from 1-2 times weekly to almost every other day due to feeling like she has been having heavy breast. This effects her breathing and is noticeable if she doesn't take furosemide for 4 days. This sensation has been going on for about 6 weeks Isosorbide mn 30 mg daily  Metoprolol tartrate 25 mg bid  Patient verbally confirms she is taking the above medications as directed. Yes  How often are you  checking your Blood Pressure?  Patient stated she checks her BP whenever she feels "funny" or feels like her ears are full or popping. She reports not recording her readings and I encouraged her to begin so that we may have readings for future visits.   she checks her blood pressure  after taking her medication.  Current home BP readings:  None Given   Wrist or arm cuff: wrist  Caffeine intake: infrequently  Salt intake: little to none OTC medications including pseudoephedrine or NSAIDs? Aspirin   Any readings above 180/120? No  What recent interventions/DTPs have been made by any provider to improve Blood Pressure control since last CPP Visit:  No recent interventions   Any recent hospitalizations or ED visits since last visit with CPP? No   What diet changes have been made to improve Blood Pressure Control?  Patient stated she mostly eats meats, vegetables, and breakfast foods. Patient stated she goes out to eat with her grandson often.   What exercise is being done to improve your Blood Pressure Control?  Patient stated she does not get much activity and mostly stays at home. Patient stated   Adherence Review: Is the patient currently on ACE/ARB medication?No  Does the patient have >5 day gap between last estimated fill dates?  furosemide 40 mg- 21 DS last filled 11/21/20 Isosorbide mn 30 mg- 90 DS last filled 12/28/20 Metoprolol tartrate 25 mg- 90 DS last filled 12/28/20  Care Gaps: Last annual wellness visit? 10/03/20  Star Rating Drugs:  Medication:  Last Fill: [facility] Atorvastatin 20 mg 12/28/20 90 DS  CPP phone visit was scheduled for 04/17/21 at 11 am   [employer], CMA

## 2021-02-26 ENCOUNTER — Other Ambulatory Visit: Payer: Self-pay | Admitting: Physician Assistant

## 2021-02-26 DIAGNOSIS — K219 Gastro-esophageal reflux disease without esophagitis: Secondary | ICD-10-CM

## 2021-02-27 ENCOUNTER — Telehealth: Payer: Medicare Other

## 2021-02-28 NOTE — Telephone Encounter (Cosign Needed)
Patient was made aware and will be discussing her symptoms at her Cardiology visit especially since she is still experiencing the things previously discussed   Wilford Sports CPA, [place]

## 2021-03-02 ENCOUNTER — Ambulatory Visit (INDEPENDENT_AMBULATORY_CARE_PROVIDER_SITE_OTHER): Payer: Medicare Other | Admitting: Cardiology

## 2021-03-02 ENCOUNTER — Encounter: Payer: Self-pay | Admitting: Cardiology

## 2021-03-02 ENCOUNTER — Other Ambulatory Visit: Payer: Self-pay

## 2021-03-02 VITALS — BP 122/76 | HR 108 | Ht 65.0 in | Wt 249.2 lb

## 2021-03-02 DIAGNOSIS — Z7901 Long term (current) use of anticoagulants: Secondary | ICD-10-CM

## 2021-03-02 DIAGNOSIS — I4811 Longstanding persistent atrial fibrillation: Secondary | ICD-10-CM

## 2021-03-02 DIAGNOSIS — Z79899 Other long term (current) drug therapy: Secondary | ICD-10-CM

## 2021-03-02 DIAGNOSIS — E782 Mixed hyperlipidemia: Secondary | ICD-10-CM

## 2021-03-02 DIAGNOSIS — I25118 Atherosclerotic heart disease of native coronary artery with other forms of angina pectoris: Secondary | ICD-10-CM

## 2021-03-02 DIAGNOSIS — I110 Hypertensive heart disease with heart failure: Secondary | ICD-10-CM

## 2021-03-02 NOTE — Patient Instructions (Addendum)
Medication Instructions:  Your physician has recommended you make the following change in your medication:  STOP: Aspirin  Take an extra 1/2 tablet of your metoprolol daily as needed for rapid heart rates greater than 120 beats per minute.  *If you need a refill on your cardiac medications before your next appointment, please call your pharmacy*   Lab Work: None If you have labs (blood work) drawn today and your tests are completely normal, you will receive your results only by: [place] (if you have MyChart) OR A paper copy in the mail If you have any lab test that is abnormal or we need to change your treatment, we will call you to review the results.   Testing/Procedures: None   Follow-Up: At [facility], you and your health needs are our priority.  As part of our continuing mission to provide you with exceptional heart care, we have created designated Provider Care Teams.  These Care Teams include your primary Cardiologist (physician) and Advanced Practice Providers (APPs -  Physician Assistants and Nurse Practitioners) who all work together to provide you with the care you need, when you need it.  We recommend signing up for the patient portal called "MyChart".  Sign up information is provided on this After Visit Summary.  MyChart is used to connect with patients for Virtual Visits (Telemedicine).  Patients are able to view lab/test results, encounter notes, upcoming appointments, etc.  Non-urgent messages can be sent to your provider as well.   To learn more about what you can do with MyChart, go to [email or web address].    Your next appointment:   1 year(s)  The format for your next appointment:   In Person  Provider:   [name], MD   Other Instructions

## 2021-03-02 NOTE — Progress Notes (Signed)
Cardiology Office Note:    Date:  03/02/2021   ID:  Miranda Moses, DOB [birth year], MRN [id number]  PCP:  [name], PA-C  Cardiologist:  [name], MD    Referring MD: [name], PA-C    ASSESSMENT:    1. Hypertensive heart disease with heart failure ([place])   2. Longstanding persistent atrial fibrillation ([place])   3. High risk medication use   4. Chronic anticoagulation   5. Mixed hyperlipidemia   6. Coronary artery disease of native artery of native heart with stable angina pectoris ([place])    PLAN:    In order of problems listed above:  She is doing heart failure is compensated continue her current loop diuretic and antihypertensive treatment including her beta-blocker. Stable rate controlled with beta-blocker and anticoagulant she is also taking aspirin I asked her to stop because of the increased bleeding risk No longer in digoxin would not resume Continue her anticoagulant Lipids at target continue high intensity statin with stable CAD no anginal discomfort   Next appointment: 1 year   Medication Adjustments/Labs and Tests Ordered: Current medicines are reviewed at length with the patient today.  Concerns regarding medicines are outlined above.  Orders Placed This Encounter  Procedures   EKG 12-Lead   No orders of the defined types were placed in this encounter.      History of Present Illness:    Miranda Moses is a 80 y.o. female with a hx of hypertensive heart disease with heart failure persistent atrial fibrillation with rate control and anticoagulation including digoxin hyper lipidemia and CAD last seen 02/16/2020.  Compliance with diet, lifestyle and medications: Yes  She continues to do well with no longer on digoxin.  At times she is aware of atrial fibrillation not severe or sustained with rates greater than 100 and I told her she could take an extra half tablet of beta-blocker if needed.  She is not having edema shortness of breath chest pain palpitation  or syncope she tolerates her anticoagulant without bleeding and her statin without muscle pain or weakness and has had no anginal discomfort.  Recently had labs with her PCP 02/19/2021: Lipids at target cholesterol 149 LDL 79 triglycerides 90 HDL 53 A1c 6.3% creatinine 0.99  Echocardiogram performed 10/14/2018 showed ejection fraction 60 to 65% normal right ventricular size and function moderate left atrial enlargement and pulmonary hypertension with a estimated peak pulmonary systolic pressure 65 mmHg.  Left ventricular diastolic dysfunction was seen with pseudonormalization consistent with diastolic heart failure.   Lower extremity ABI was normal in the right lower extremity and mildly reduced in the left lower extremity.   Cardiac CTA 11/06/2018 showed the pulmonary arteries being normal size.  The left main coronary artery mild 25 to 50% stenosis.  The left anterior descending coronary artery proximally had a 50 to 69% stenosis with normal FFR in the mid and distal vessel had diffuse disease.  Left circumflex coronary artery had moderate proximal 50 to 69% stenosis in the right coronary artery had a severe calcific plaque in the ostial right coronary artery extending into the proximal portion with stenosis greater than 70%.  The study was sent for [facility] which showed diminished flow in the distal left circumflex only Past Medical History:  Diagnosis Date   Angina pectoris ([place]) 02/18/2018   CAD in native artery    by CT coronary 10/2018 >> medical therapy    CHF (congestive heart failure), NYHA class I, chronic, diastolic ([place]) 0000000   Chronic diastolic (  congestive) heart failure ([facility])    Coronary artery disease of native artery of native heart with stable angina pectoris ([place]) 08/09/2019   COVID-19 virus infection    Encounter for vaccination 10/03/2020   Family history of coronary artery disease 01/30/2018   Gastro-esophageal reflux disease without esophagitis 01/30/2018   GERD (gastroesophageal  reflux disease)    Health maintenance examination 10/03/2020   History of pulmonary embolism 08/23/2019   Hyperlipidemia    Hypertension    Hypertensive heart disease with heart failure ([place]) 01/30/2018   Mixed hyperlipidemia 02/18/2018   New onset atrial fibrillation ([place])    Other fatigue 02/10/2020   Other specified menopausal and perimenopausal disorders 02/10/2020   PAD (peripheral artery disease) ([place]) 08/28/2018   PAH (pulmonary artery hypertension) ([place]) 08/28/2018   Pre-diabetes    Prediabetes 02/10/2020   Pulmonary embolus ([place])    Pulmonary hypertension ([place])    Vitamin D deficiency 08/09/2019    Past Surgical History:  Procedure Laterality Date   ABDOMINAL HYSTERECTOMY     CATARACT EXTRACTION Left 03/2014   CATARACT EXTRACTION Right 05/2014   COLONOSCOPY  11/05/2013    Current Medications: Current Meds  Medication Sig   apixaban (ELIQUIS) 5 MG TABS tablet Take 5 mg by mouth 2 (two) times daily.   Ascorbic Acid (VITAMIN C) 100 MG tablet Take 100 mg by mouth daily.   atorvastatin (LIPITOR) 20 MG tablet TAKE 1 TABLET(20 MG) BY MOUTH EVERY DAY   benzonatate (TESSALON) 100 MG capsule Take 2 capsules (200 mg total) by mouth 3 (three) times daily as needed for cough.   calcium carbonate (OSCAL) 1500 (600 Ca) MG TABS tablet Take 600 mg of elemental calcium by mouth daily.   Cholecalciferol (VITAMIN D3 PO) Take 2 capsules by mouth daily.   furosemide (LASIX) 40 MG tablet TAKE 1 TABLET(40 MG) BY MOUTH DAILY AS NEEDED FOR SWELLING   isosorbide mononitrate (IMDUR) 30 MG 24 hr tablet TAKE 1 TABLET BY MOUTH EVERY DAY   loratadine (CLARITIN) 10 MG tablet Take 1 tablet (10 mg total) by mouth daily.   metoprolol tartrate (LOPRESSOR) 25 MG tablet TAKE 1 TABLET(25 MG) BY MOUTH TWICE DAILY   Multiple Vitamin (MULTIVITAMIN WITH MINERALS) TABS tablet Take 1 tablet by mouth daily.   Multiple Vitamins-Minerals (OCUVITE PRESERVISION PO) Take 1 tablet by mouth in the morning and at bedtime.    omeprazole (PRILOSEC) 20 MG capsule TAKE ONE CAPSULE BY MOUTH EVERY DAY   OVER THE COUNTER MEDICATION Take 1 tablet by mouth daily. Circulation vitamin from QVC -   triamcinolone (NASACORT) 55 MCG/ACT AERO nasal inhaler Place 1 spray into the nose daily as needed for allergies (allergies).   vitamin E 200 UNIT capsule Take 200 Units by mouth daily.   [DISCONTINUED] aspirin EC 81 MG tablet Take 81 mg by mouth daily.     Allergies:   Clarithromycin, Codeine, and Penicillins   Social History   Socioeconomic History   Marital status: Widowed    Spouse name: Not on file   Number of children: 1   Years of education: Not on file   Highest education level: Not on file  Occupational History   Occupation: retired  Tobacco Use   Smoking status: Never   Smokeless tobacco: Never  Vaping Use   Vaping Use: Never used  Substance and Sexual Activity   Alcohol use: Never   Drug use: Never   Sexual activity: Not on file  Other Topics Concern   Not on file  Social  History Narrative   Not on file   Social Determinants of Health   Financial Resource Strain: Not on file  Food Insecurity: No Food Insecurity   Worried About Charity fundraiser in the Last Year: Never true   Ran Out of Food in the Last Year: Never true  Transportation Needs: No Transportation Needs   Lack of Transportation (Medical): No   Lack of Transportation (Non-Medical): No  Physical Activity: Not on file  Stress: Not on file  Social Connections: Not on file     Family History: The patient's family history includes Heart disease in her father. ROS:   Please see the history of present illness.    All other systems reviewed and are negative.  EKGs/Labs/Other Studies Reviewed:    The following studies were reviewed today:  EKG:  EKG ordered today and personally reviewed.  The ekg ordered today demonstrates atrial fibrillation rate 100 210 bpm low voltage right axis deviation  Recent Labs: 02/19/2021: ALT 12; BUN  20; Creatinine, Ser 0.99; Hemoglobin 12.9; Platelets 191; Potassium 3.9; Sodium 141  Recent Lipid Panel    Component Value Date/Time   CHOL 149 02/19/2021 1043   TRIG 90 02/19/2021 1043   HDL 53 02/19/2021 1043   CHOLHDL 2.8 02/19/2021 1043   LDLCALC 79 02/19/2021 1043    Physical Exam:    VS:  BP 122/76   Pulse (!) 108   Ht '5\' 5"'$  (1.651 m)   Wt 249 lb 3.2 oz (113 kg)   SpO2 94%   BMI 41.47 kg/m     Wt Readings from Last 3 Encounters:  03/02/21 249 lb 3.2 oz (113 kg)  02/19/21 241 lb (109.3 kg)  10/03/20 239 lb (108.4 kg)     GEN:  Well nourished, well developed in no acute distress HEENT: Normal NECK: No JVD; No carotid bruits LYMPHATICS: No lymphadenopathy CARDIAC: Irregular rate and rhythm RRR, no murmurs, rubs, gallops RESPIRATORY:  Clear to auscultation without rales, wheezing or rhonchi  ABDOMEN: Soft, non-tender, non-distended MUSCULOSKELETAL:  No edema; No deformity  SKIN: Warm and dry NEUROLOGIC:  Alert and oriented x 3 PSYCHIATRIC:  Normal affect    Signed, [name], MD  03/02/2021 3:33 PM    [facility]

## 2021-03-28 ENCOUNTER — Other Ambulatory Visit: Payer: Self-pay | Admitting: Cardiology

## 2021-03-28 ENCOUNTER — Other Ambulatory Visit: Payer: Self-pay | Admitting: Physician Assistant

## 2021-03-28 NOTE — Telephone Encounter (Signed)
Metoprolol tartrate 25 mg # 180 tablets x 3 refills sent to [facility], [place]

## 2021-04-17 ENCOUNTER — Other Ambulatory Visit: Payer: Self-pay

## 2021-04-17 ENCOUNTER — Ambulatory Visit (INDEPENDENT_AMBULATORY_CARE_PROVIDER_SITE_OTHER): Payer: Medicare Other

## 2021-04-17 DIAGNOSIS — K219 Gastro-esophageal reflux disease without esophagitis: Secondary | ICD-10-CM

## 2021-04-17 DIAGNOSIS — E782 Mixed hyperlipidemia: Secondary | ICD-10-CM

## 2021-04-17 DIAGNOSIS — R7303 Prediabetes: Secondary | ICD-10-CM

## 2021-04-17 DIAGNOSIS — I110 Hypertensive heart disease with heart failure: Secondary | ICD-10-CM

## 2021-04-17 NOTE — Patient Instructions (Signed)
Visit Information   Goals Addressed   None    Patient Care Plan: CCM Pharmacy Care Plan     Problem Identified: afib, cad, chf, gerd   Priority: High  Onset Date: 08/24/2020     Long-Range Goal: Disease Management   Start Date: 08/24/2020  Expected End Date: 08/24/2021  Recent Progress: On track  Priority: High  Note:    Current Barriers:  Unable to independently afford treatment regimen  Pharmacist Clinical Goal(s):  Over the next 90 days, patient will verbalize ability to afford treatment regimen through collaboration with PharmD and provider.   Interventions: 1:1 collaboration with [name], PA-C regarding development and update of comprehensive plan of care as evidenced by provider attestation and co-signature Inter-disciplinary care team collaboration (see longitudinal plan of care) Comprehensive medication review performed; medication list updated in electronic medical record  Hyperlipidemia: (LDL goal < 55) The 10-year ASCVD risk score (Arnett DK, et al., 2019) is: 26.1%   Values used to calculate the score:     Age: 80 years     Sex: Female     Is Non-Hispanic African American: No     Diabetic: No     Tobacco smoker: No     Systolic Blood Pressure: 696 mmHg     Is BP treated: Yes     HDL Cholesterol: 53 mg/dL     Total Cholesterol: 149 mg/dL Lab Results  Component Value Date   CHOL 149 02/19/2021   CHOL 158 08/21/2020   CHOL 147 02/10/2020   Lab Results  Component Value Date   HDL 53 02/19/2021   HDL 54 08/21/2020   HDL 39 (L) 02/10/2020   Lab Results  Component Value Date   LDLCALC 79 02/19/2021   LDLCALC 87 08/21/2020   LDLCALC 86 02/10/2020   Lab Results  Component Value Date   TRIG 90 02/19/2021   TRIG 91 08/21/2020   TRIG 122 02/10/2020   Lab Results  Component Value Date   CHOLHDL 2.8 02/19/2021   CHOLHDL 2.9 08/21/2020   CHOLHDL 3.8 02/10/2020  No results found for: LDLDIRECT -Not ideally controlled -Current  treatment: Atorvastatin 20 mg daily  Aspirin ec 81 mg daily  -Medications previously tried: none reported  -Current dietary patterns: avoids salt. Eats out. Doesn't have a huge appetite. Likes fish and cheeseburgers from restaurants.  -Current exercise habits: no regular.  -Educated on Cholesterol goals;  Benefits of statin for ASCVD risk reduction; Importance of limiting foods high in cholesterol; Exercise goal of 150 minutes per week; -Counseled on diet and exercise extensively Recommended consider increasing atorvastatin 40 mg daily if cholesterol not reduced at next labs. Patient has not been exercising but plans to try to increase activity.   Diabetes (A1c goal <7%) Lab Results  Component Value Date   HGBA1C 6.3 (H) 02/19/2021   HGBA1C 7.1 (H) 02/10/2020   HGBA1C 6.7 (H) 11/08/2019   Lab Results  Component Value Date   LDLCALC 79 02/19/2021   CREATININE 0.99 02/19/2021   Lab Results  Component Value Date   NA 141 02/19/2021   K 3.9 02/19/2021   CREATININE 0.99 02/19/2021   EGFR 58 (L) 02/19/2021   GFRNONAA 58 (L) 02/10/2020   GLUCOSE 113 (H) 02/19/2021   Lab Results  Component Value Date   WBC 5.0 02/19/2021   HGB 12.9 02/19/2021   HCT 40.5 02/19/2021   MCV 85 02/19/2021   PLT 191 02/19/2021  -Controlled -Current medications: Diet and lifestyle  -Medications previously tried: none  -  Current home glucose readings fasting glucose: doesn't check post prandial glucose: doesn't check -Denies hypoglycemic/hyperglycemic symptoms -Current meal patterns:  breakfast: eggs  Lunch and dinner: enjoys grabbing a hamburger with her grandson   -Current exercise: no regular exercise  -Educated onA1c and blood sugar goals; Exercise goal of 150 minutes per week; Carbohydrate counting and/or plate method -Counseled to check feet daily and get yearly eye exams -Counseled on diet and exercise extensively  Atrial Fibrillation (Goal: prevent stroke and major  bleeding) -Controlled -CHADSVASC: 7 -Current treatment: Rate control: metoprolol tartrate 25 mg bid Anticoagulation: Eliquis 5 mg bid  -Medications previously tried: none reported -Home BP and HR readings:   04/17/21: 112/70 -Counseled on increased risk of stroke due to Afib and benefits of anticoagulation for stroke prevention; importance of adherence to anticoagulant exactly as prescribed; avoidance of NSAIDs due to increased bleeding risk with anticoagulants; -Recommended to continue current medication April 2022: Collaborated with [facility] to fill Eliquis. Patient aware that copay and deductible will be $522 this year. We will be able to complete patient assistance at that time due to meting 3% out of pocket requirement. Patient will stop in office to sign completed Eliquis form and bring proof of social security income and out of pocket cost on medications for 2022. Pharmacist faxed prior authorization to Dr. Joya Gaskins office for patient.  Prior authorization approved 08/28/2020. Pharmacist updated patient via phone and requested fill of medication through [place].  October 2022: Patient states meds are going well and no issues. Will update PAP in December  Heart Failure (Goal: manage symptoms and prevent exacerbations) -Controlled -Last ejection fraction: 60-65%  (Date: 09/2018) -HF type: Hypertensive Heart disease with heart failure -NYHA Class: I (no actitivty limitation) -Current treatment: furosemide 40 mg daily prn edema Isosorbide mn 30 mg daily  Metoprolol tartrate 25 mg bid -Medications previously tried: none reported  -Current home BP/HR readings: in frequently checking but 110/55 and 107/63 mmHg -Current dietary habits: eats out several times each week. Doesn't salt food. Minimal appetite.  -Current exercise habits: no regular exercise -Educated on Benefits of medications for managing symptoms and prolonging life Importance of weighing daily; if you gain more than 3  pounds in one day or 5 pounds in one week, contact provider Importance of blood pressure control -Counseled on diet and exercise extensively October 2022: Furosemide dose increased last month due to increased pressure on breasts creating trouble breathing     Osteoporosis / Osteopenia (Goal minimize risk of fractures/breaks) -Controlled -Last DEXA Scan: 09/21   T-Score femoral neck: -1.4  T-Score forearm radius: -1.8  10-year probability of major osteoporotic fracture: 17.7  10-year probability of hip fracture: 8.8 -Patient is a candidate for pharmacologic treatment due to T-Score -1.0 to -2.5 and 10-year risk of hip fracture > 3% -Current treatment  Vitamin D daily  Calcium 600 mg daily  -Medications previously tried: none reported  -Recommend [phone] units of vitamin D daily. Recommend 1200 mg of calcium daily from dietary and supplemental sources. Recommend weight-bearing and muscle strengthening exercises for building and maintaining bone density. -Counseled on diet and exercise extensively Recommended Prolia but patient declined at this time. Discussed importance of calcium citrate due to PPI use.   GERD (Goal: minimize symptoms of reflux ) -Controlled -Current treatment  Omeprazole 20 mg daily  -Medications previously tried: none reported  October 2022: patient denied any Hx of bleeds, counseled on trying to take every other day to prevent bone degradation   Patient Goals/Self-Care Activities Over the  next 90 days, patient will:  - take medications as prescribed focus on medication adherence by using pill box target a minimum of 150 minutes of moderate intensity exercise weekly  Follow Up Plan: Telephone follow up appointment with care management team member scheduled for: Feb 2023  [place] Constable, Pharm.D. - [phone]       The patient verbalized understanding of instructions, educational materials, and care plan provided today and declined offer to receive  copy of patient instructions, educational materials, and care plan.  The pharmacy team will reach out to the patient again over the next 90 days.   Lane Hacker, [facility]

## 2021-04-17 NOTE — Progress Notes (Signed)
Chronic Care Management Pharmacy Note  04/17/2021 Name:  Miranda Moses MRN:  [id number] DOB:  [birth year]  Summary: Pleasant 80 year old female presents for F/U CCM visit. She is widowed, husband passed in 2019. She has a daughter and a grandson who is in [place] that she goes out to eat for lunch with every day, she tells me it's the highlight of her day.   Plan Recommendations:  Will f/u with PAP in December  Subjective: Miranda Moses is an 80 y.o. year old female who is a primary patient of [name], [place].  The CCM team was consulted for assistance with disease management and care coordination needs.    Engaged with patient by telephone for follow up visit in response to provider referral for pharmacy case management and/or care coordination services.   Consent to Services:  The patient was given information about Chronic Care Management services, agreed to services, and gave verbal consent prior to initiation of services.  Please see initial visit note for detailed documentation.   Patient Care Team: [name], Hershal Coria as PCP - General (Physician Assistant) Burnice Logan, [facility] (Inactive) as Pharmacist (Pharmacist)  Recent office visits: 08/21/2020 - labs stable. No medication changes.  07/31/2020 - viral upper respiratory infection - loratadine, benzonotate, mucinex and flonase.  Recent consult visits: None since last ccm visit   Hospital visits: None in previous 6 months  Objective:  Lab Results  Component Value Date   CREATININE 0.99 02/19/2021   BUN 20 02/19/2021   GFRNONAA 58 (L) 02/10/2020   GFRAA 67 02/10/2020   NA 141 02/19/2021   K 3.9 02/19/2021   CALCIUM 9.7 02/19/2021   CO2 24 02/19/2021    Lab Results  Component Value Date/Time   HGBA1C 6.3 (H) 02/19/2021 10:43 AM   HGBA1C 7.1 (H) 02/10/2020 09:57 AM    Last diabetic Eye exam: No results found for: HMDIABEYEEXA  Last diabetic Foot exam: No results found for: HMDIABFOOTEX   Lab Results  Component Value  Date   CHOL 149 02/19/2021   HDL 53 02/19/2021   LDLCALC 79 02/19/2021   TRIG 90 02/19/2021   CHOLHDL 2.8 02/19/2021    Hepatic Function Latest Ref Rng & Units 02/19/2021 08/21/2020 02/10/2020  Total Protein 6.0 - 8.5 g/dL 6.7 6.8 6.2  Albumin 3.7 - 4.7 g/dL 3.9 4.2 3.8  AST 0 - 40 IU/L 16 17 13   ALT 0 - 32 IU/L 12 13 14   Alk Phosphatase 44 - 121 IU/L 137(H) 117 111  Total Bilirubin 0.0 - 1.2 mg/dL 0.8 0.9 0.7  Bilirubin, Direct 0.00 - 0.40 mg/dL - - -    Lab Results  Component Value Date/Time   TSH 1.600 02/10/2020 09:57 AM   TSH 1.360 10/08/2019 10:01 AM    CBC Latest Ref Rng & Units 02/19/2021 08/21/2020 02/10/2020  WBC 3.4 - 10.8 x10E3/uL 5.0 4.3 4.8  Hemoglobin 11.1 - 15.9 g/dL 12.9 13.3 11.7  Hematocrit 34.0 - 46.6 % 40.5 42.2 37.8  Platelets 150 - 450 x10E3/uL 191 180 190    Lab Results  Component Value Date/Time   VD25OH 61.1 02/19/2021 10:43 AM   VD25OH 69.4 08/21/2020 10:19 AM    Clinical ASCVD: Yes  The 10-year ASCVD risk score (Arnett DK, et al., 2019) is: 26.1%   Values used to calculate the score:     Age: 47 years     Sex: Female     Is Non-Hispanic African American: No     Diabetic: No  Tobacco smoker: No     Systolic Blood Pressure: 759 mmHg     Is BP treated: Yes     HDL Cholesterol: 53 mg/dL     Total Cholesterol: 149 mg/dL    Depression screen [facility] 2/9 10/03/2020  Decreased Interest 0  Down, Depressed, Hopeless 0  PHQ - 2 Score 0     CHA2DS2-VASc Score = 7  The patient's score is based upon: CHF History: 1 HTN History: 1 Diabetes History: 1 Stroke History: 0 Vascular Disease History: 1 Age Score: 2 Gender Score: 1   Social History   Tobacco Use  Smoking Status Never  Smokeless Tobacco Never   BP Readings from Last 3 Encounters:  03/02/21 122/76  02/19/21 112/70  10/03/20 110/68   Pulse Readings from Last 3 Encounters:  03/02/21 (!) 108  02/19/21 90  10/03/20 80   Wt Readings from Last 3 Encounters:  03/02/21 249 lb 3.2  oz (113 kg)  02/19/21 241 lb (109.3 kg)  10/03/20 239 lb (108.4 kg)    Assessment/Interventions: Review of patient past medical history, allergies, medications, health status, including review of consultants reports, laboratory and other test data, was performed as part of comprehensive evaluation and provision of chronic care management services.   SDOH:  (Social Determinants of Health) assessments and interventions performed: Yes   CCM Care Plan  Allergies  Allergen Reactions   Clarithromycin     Numbness of extremities   Codeine Other (See Comments)    Caused pain   Penicillins Rash    Did it involve swelling of the face/tongue/throat, SOB, or low BP? No Did it involve sudden or severe rash/hives, skin peeling, or any reaction on the inside of your mouth or nose? Yes Did you need to seek medical attention at a hospital or doctor's office? Yes When did it last happen?      20 years If all above answers are "NO", may proceed with cephalosporin use.     Medications Reviewed Today     Reviewed by Lowella Grip, CMA (Certified Medical Assistant) on 03/02/21 at 1457  Med List Status: <None>   Medication Order Taking? Sig Documenting Provider Last Dose Status Informant  apixaban (ELIQUIS) 5 MG TABS tablet [id number]  Take 5 mg by mouth 2 (two) times daily. [provider]  Active   Ascorbic Acid (VITAMIN C) 100 MG tablet [id number]  Take 100 mg by mouth daily. [provider]  Active   aspirin EC 81 MG tablet [id number]  Take 81 mg by mouth daily. [provider]  Active Self  atorvastatin (LIPITOR) 20 MG tablet [id number]  TAKE 1 TABLET(20 MG) BY MOUTH EVERY DAY [name], PA-C  Active   benzonatate (TESSALON) 100 MG capsule [id number]  Take 2 capsules (200 mg total) by mouth 3 (three) times daily as needed for cough. Rip Harbour, NP  Active   calcium carbonate (OSCAL) 1500 (600 Ca) MG TABS tablet [id number]  Take 600 mg of elemental calcium by mouth  daily. [provider]  Active   Cholecalciferol (VITAMIN D3 PO) [id number]  Take 2 capsules by mouth daily. [provider]  Active Self  furosemide (LASIX) 40 MG tablet [id number]  TAKE 1 TABLET(40 MG) BY MOUTH DAILY AS NEEDED FOR SWELLING [name], PA-C  Active   isosorbide mononitrate (IMDUR) 30 MG 24 hr tablet [id number]  TAKE 1 TABLET BY MOUTH EVERY DAY Richardo Priest, MD  Active   loratadine (CLARITIN) 10 MG tablet [id number]  Take 1 tablet (10 mg total) by mouth daily. Rip Harbour, NP  Active   metoprolol tartrate (LOPRESSOR) 25 MG tablet [id number]  TAKE 1 TABLET(25 MG) BY MOUTH TWICE DAILY [name] Hilton Cork, MD  Active   Multiple Vitamin (MULTIVITAMIN WITH MINERALS) TABS tablet [id number]  Take 1 tablet by mouth daily. [provider]  Active Self  Multiple Vitamins-Minerals (OCUVITE PRESERVISION PO) [id number]  Take 1 tablet by mouth in the morning and at bedtime. [provider]  Active   omeprazole (PRILOSEC) 20 MG capsule [id number]  TAKE ONE CAPSULE BY MOUTH EVERY DAY [name]  Active   OVER THE COUNTER MEDICATION [id number]  Take 1 tablet by mouth daily. Circulation vitamin from [facility] - [provider]  Active   triamcinolone (NASACORT) 55 MCG/ACT AERO nasal inhaler [id number]  Place 1 spray into the nose daily as needed for allergies (allergies). [provider]  Active Self  vitamin E 200 UNIT capsule [id number]  Take 200 Units by mouth daily. [provider]  Active             Patient Active Problem List   Diagnosis Date Noted   Health maintenance examination 10/03/2020   Encounter for vaccination 10/03/2020   Pulmonary hypertension ([place])    Pre-diabetes    Hypertension    Hyperlipidemia    GERD (gastroesophageal reflux disease)    Chronic diastolic (congestive) heart failure (HCC)    CAD in native artery    Prediabetes 02/10/2020   Other fatigue 02/10/2020   Other specified menopausal and  perimenopausal disorders 02/10/2020   History of pulmonary embolism 08/23/2019   CHF (congestive heart failure), NYHA class I, chronic, diastolic ([place]) 17/49/4496   Vitamin D deficiency 08/09/2019   Coronary artery disease of native artery of native heart with stable angina pectoris ([place]) 08/09/2019   New onset atrial fibrillation (HCC)    Pulmonary embolus ([place])    COVID-19 virus infection    PAH (pulmonary artery hypertension) ([place]) 08/28/2018   PAD (peripheral artery disease) ([place]) 08/28/2018   Angina pectoris ([place]) 02/18/2018   Mixed hyperlipidemia 02/18/2018   Family history of coronary artery disease 01/30/2018   Hypertensive heart disease with heart failure ([place]) 01/30/2018   Gastro-esophageal reflux disease without esophagitis 01/30/2018    Immunization History  Administered Date(s) Administered   Fluad Quad(high Dose 65+) 03/23/2020, 02/19/2021   Influenza-Unspecified 03/13/2017, 06/24/2018, 05/07/2019   PFIZER(Purple Top)SARS-COV-2 Vaccination 12/09/2019, 12/28/2019   Pneumococcal Conjugate-13 04/13/2014   Pneumococcal-Unspecified 05/31/2009   Zoster Recombinat (Shingrix) 10/25/2020   Zoster, Live 02/11/2014    Conditions to be addressed/monitored:  Heart Failure, Coronary Artery Disease and GERD  Care Plan : CCM Pharmacy Care Plan  Updates made by Lane Hacker, RPH since 04/17/2021 12:00 AM     Problem: afib, cad, chf, gerd   Priority: High  Onset Date: 08/24/2020     Long-Range Goal: Disease Management   Start Date: 08/24/2020  Expected End Date: 08/24/2021  Recent Progress: On track  Priority: High  Note:    Current Barriers:  Unable to independently afford treatment regimen  Pharmacist Clinical Goal(s):  Over the next 90 days, patient will verbalize ability to afford treatment regimen through collaboration with PharmD and provider.   Interventions: 1:1 collaboration with [name], PA-C regarding development and update of comprehensive plan of care  as evidenced by provider attestation and co-signature Inter-disciplinary care team collaboration (see longitudinal plan of care) Comprehensive medication review performed; medication list updated in electronic medical  record  Hyperlipidemia: (LDL goal < 55) The 10-year ASCVD risk score (Arnett DK, et al., 2019) is: 26.1%   Values used to calculate the score:     Age: 59 years     Sex: Female     Is Non-Hispanic African American: No     Diabetic: No     Tobacco smoker: No     Systolic Blood Pressure: 511 mmHg     Is BP treated: Yes     HDL Cholesterol: 53 mg/dL     Total Cholesterol: 149 mg/dL Lab Results  Component Value Date   CHOL 149 02/19/2021   CHOL 158 08/21/2020   CHOL 147 02/10/2020   Lab Results  Component Value Date   HDL 53 02/19/2021   HDL 54 08/21/2020   HDL 39 (L) 02/10/2020   Lab Results  Component Value Date   LDLCALC 79 02/19/2021   LDLCALC 87 08/21/2020   LDLCALC 86 02/10/2020   Lab Results  Component Value Date   TRIG 90 02/19/2021   TRIG 91 08/21/2020   TRIG 122 02/10/2020   Lab Results  Component Value Date   CHOLHDL 2.8 02/19/2021   CHOLHDL 2.9 08/21/2020   CHOLHDL 3.8 02/10/2020  No results found for: LDLDIRECT -Not ideally controlled -Current treatment: Atorvastatin 20 mg daily  Aspirin ec 81 mg daily  -Medications previously tried: none reported  -Current dietary patterns: avoids salt. Eats out. Doesn't have a huge appetite. Likes fish and cheeseburgers from restaurants.  -Current exercise habits: no regular.  -Educated on Cholesterol goals;  Benefits of statin for ASCVD risk reduction; Importance of limiting foods high in cholesterol; Exercise goal of 150 minutes per week; -Counseled on diet and exercise extensively Recommended consider increasing atorvastatin 40 mg daily if cholesterol not reduced at next labs. Patient has not been exercising but plans to try to increase activity.   Diabetes (A1c goal <7%) Lab Results  Component  Value Date   HGBA1C 6.3 (H) 02/19/2021   HGBA1C 7.1 (H) 02/10/2020   HGBA1C 6.7 (H) 11/08/2019   Lab Results  Component Value Date   LDLCALC 79 02/19/2021   CREATININE 0.99 02/19/2021   Lab Results  Component Value Date   NA 141 02/19/2021   K 3.9 02/19/2021   CREATININE 0.99 02/19/2021   EGFR 58 (L) 02/19/2021   GFRNONAA 58 (L) 02/10/2020   GLUCOSE 113 (H) 02/19/2021   Lab Results  Component Value Date   WBC 5.0 02/19/2021   HGB 12.9 02/19/2021   HCT 40.5 02/19/2021   MCV 85 02/19/2021   PLT 191 02/19/2021  -Controlled -Current medications: Diet and lifestyle  -Medications previously tried: none  -Current home glucose readings fasting glucose: doesn't check post prandial glucose: doesn't check -Denies hypoglycemic/hyperglycemic symptoms -Current meal patterns:  breakfast: eggs  Lunch and dinner: enjoys grabbing a hamburger with her grandson   -Current exercise: no regular exercise  -Educated onA1c and blood sugar goals; Exercise goal of 150 minutes per week; Carbohydrate counting and/or plate method -Counseled to check feet daily and get yearly eye exams -Counseled on diet and exercise extensively  Atrial Fibrillation (Goal: prevent stroke and major bleeding) -Controlled -CHADSVASC: 7 -Current treatment: Rate control: metoprolol tartrate 25 mg bid Anticoagulation: Eliquis 5 mg bid  -Medications previously tried: none reported -Home BP and HR readings:   04/17/21: 112/70 -Counseled on increased risk of stroke due to Afib and benefits of anticoagulation for stroke prevention; importance of adherence to anticoagulant exactly as prescribed; avoidance of NSAIDs due  to increased bleeding risk with anticoagulants; -Recommended to continue current medication April 2022: Collaborated with [facility] to fill Eliquis. Patient aware that copay and deductible will be $522 this year. We will be able to complete patient assistance at that time due to meting 3% out of pocket  requirement. Patient will stop in office to sign completed Eliquis form and bring proof of social security income and out of pocket cost on medications for 2022. Pharmacist faxed prior authorization to Dr. Joya Gaskins office for patient.  Prior authorization approved 08/28/2020. Pharmacist updated patient via phone and requested fill of medication through [facility].  October 2022: Patient states meds are going well and no issues. Will update PAP in December  Heart Failure (Goal: manage symptoms and prevent exacerbations) -Controlled -Last ejection fraction: 60-65%  (Date: 09/2018) -HF type: Hypertensive Heart disease with heart failure -NYHA Class: I (no actitivty limitation) -Current treatment: furosemide 40 mg daily prn edema Isosorbide mn 30 mg daily  Metoprolol tartrate 25 mg bid -Medications previously tried: none reported  -Current home BP/HR readings: in frequently checking but 110/55 and 107/63 mmHg -Current dietary habits: eats out several times each week. Doesn't salt food. Minimal appetite.  -Current exercise habits: no regular exercise -Educated on Benefits of medications for managing symptoms and prolonging life Importance of weighing daily; if you gain more than 3 pounds in one day or 5 pounds in one week, contact provider Importance of blood pressure control -Counseled on diet and exercise extensively October 2022: Furosemide dose increased last month due to increased pressure on breasts creating trouble breathing     Osteoporosis / Osteopenia (Goal minimize risk of fractures/breaks) -Controlled -Last DEXA Scan: 09/21   T-Score femoral neck: -1.4  T-Score forearm radius: -1.8  10-year probability of major osteoporotic fracture: 17.7  10-year probability of hip fracture: 8.8 -Patient is a candidate for pharmacologic treatment due to T-Score -1.0 to -2.5 and 10-year risk of hip fracture > 3% -Current treatment  Vitamin D daily  Calcium 600 mg daily  -Medications  previously tried: none reported  -Recommend [id number] units of vitamin D daily. Recommend 1200 mg of calcium daily from dietary and supplemental sources. Recommend weight-bearing and muscle strengthening exercises for building and maintaining bone density. -Counseled on diet and exercise extensively Recommended Prolia but patient declined at this time. Discussed importance of calcium citrate due to PPI use.   GERD (Goal: minimize symptoms of reflux ) -Controlled -Current treatment  Omeprazole 20 mg daily  -Medications previously tried: none reported  October 2022: patient denied any Hx of bleeds, counseled on trying to take every other day to prevent bone degradation   Patient Goals/Self-Care Activities Over the next 90 days, patient will:  - take medications as prescribed focus on medication adherence by using pill box target a minimum of 150 minutes of moderate intensity exercise weekly  Follow Up Plan: Telephone follow up appointment with care management team member scheduled for: Feb 2023  [place] Constable, Pharm.D. - [phone]       Medication Assistance: Application for Low Income Subsidy  medication assistance program. in process.  Anticipated assistance start date uncertain .  See plan of care for additional detail. Patient was denied Low Income Subsidy. Pharmacist coordinating application for Eliquis patient assistance through [employer].   Patient's preferred pharmacy is:  [id number] [address], [address] [address] Phone: [phone] Fax: [phone]  Uses  pill box? Yes Pt endorses good compliance but indicates she has forgotten to take medication out of her pill box at night occasionally. We discussed keeping it in a location where she will see it easily. Patient also encouraged to set a reminder alarm to take medication if needed.   We discussed:  Current pharmacy is preferred with insurance plan and patient is satisfied with pharmacy services Patient decided to: Continue current medication management strategy  Care Plan and Follow Up Patient Decision:  Patient agrees to Care Plan and Follow-up.  Plan: Telephone follow up appointment with care management team member scheduled for:  Feb 2023

## 2021-04-23 DIAGNOSIS — I110 Hypertensive heart disease with heart failure: Secondary | ICD-10-CM

## 2021-04-23 DIAGNOSIS — E782 Mixed hyperlipidemia: Secondary | ICD-10-CM | POA: Diagnosis not present

## 2021-04-30 ENCOUNTER — Telehealth: Payer: Self-pay

## 2021-04-30 NOTE — Chronic Care Management (AMB) (Signed)
    Chronic Care Management Pharmacy Assistant   Name: Maryanna Stuber  MRN: [id number] DOB: Jun 14, 1941  Reason for Encounter: Patient Assistance Coordination  05/10/2021- Patient assistance application filled out for Eliquis with [employer] Patient Assistance Program for 2023 re-enrollment year. Called patient on home and mobile, no answer, home number just rings and mobile is unavailable. Mailing application to patient will highlight areas to sign.   Pattricia Boss, [place] Pharmacist Assistant [phone]

## 2021-05-29 ENCOUNTER — Telehealth: Payer: Self-pay

## 2021-05-29 NOTE — Telephone Encounter (Signed)
Patient got PAP form in the mail and didn't know what to do with it. Per previous note, CCM team unable to get in contact with patient. I explained what the PAP was, how to fill it out, and to bring it to the office once complete.

## 2021-06-01 ENCOUNTER — Telehealth: Payer: Self-pay

## 2021-06-01 NOTE — Chronic Care Management (AMB) (Signed)
Chronic Care Management Pharmacy Assistant   Name: Toluwanimi Radebaugh  MRN: [id number] DOB: Jul 20, 1940   Reason for Encounter: Disease State call for HTN    Recent office visits:  None  Recent consult visits:  None   Hospital visits:  None  Medications: Outpatient Encounter Medications as of 06/01/2021  Medication Sig   apixaban (ELIQUIS) 5 MG TABS tablet Take 5 mg by mouth 2 (two) times daily.   Ascorbic Acid (VITAMIN C) 100 MG tablet Take 100 mg by mouth daily.   atorvastatin (LIPITOR) 20 MG tablet TAKE 1 TABLET(20 MG) BY MOUTH EVERY DAY   benzonatate (TESSALON) 100 MG capsule Take 2 capsules (200 mg total) by mouth 3 (three) times daily as needed for cough.   calcium carbonate (OSCAL) 1500 (600 Ca) MG TABS tablet Take 600 mg of elemental calcium by mouth daily.   Cholecalciferol (VITAMIN D3 PO) Take 2 capsules by mouth daily.   furosemide (LASIX) 40 MG tablet TAKE 1 TABLET(40 MG) BY MOUTH DAILY AS NEEDED FOR SWELLING   isosorbide mononitrate (IMDUR) 30 MG 24 hr tablet TAKE 1 TABLET BY MOUTH EVERY DAY   loratadine (CLARITIN) 10 MG tablet Take 1 tablet (10 mg total) by mouth daily.   metoprolol tartrate (LOPRESSOR) 25 MG tablet TAKE 1 TABLET(25 MG) BY MOUTH TWICE DAILY   Multiple Vitamin (MULTIVITAMIN WITH MINERALS) TABS tablet Take 1 tablet by mouth daily.   Multiple Vitamins-Minerals (OCUVITE PRESERVISION PO) Take 1 tablet by mouth in the morning and at bedtime.   omeprazole (PRILOSEC) 20 MG capsule TAKE ONE CAPSULE BY MOUTH EVERY DAY   OVER THE COUNTER MEDICATION Take 1 tablet by mouth daily. Circulation vitamin from [facility] -   triamcinolone (NASACORT) 55 MCG/ACT AERO nasal inhaler Place 1 spray into the nose daily as needed for allergies (allergies).   vitamin E 200 UNIT capsule Take 200 Units by mouth daily.   No facility-administered encounter medications on file as of 06/01/2021.    Recent Office Vitals: BP Readings from Last 3 Encounters:  03/02/21 122/76  02/19/21 112/70   10/03/20 110/68   Pulse Readings from Last 3 Encounters:  03/02/21 (!) 108  02/19/21 90  10/03/20 80    Wt Readings from Last 3 Encounters:  03/02/21 249 lb 3.2 oz (113 kg)  02/19/21 241 lb (109.3 kg)  10/03/20 239 lb (108.4 kg)     Kidney Function Lab Results  Component Value Date/Time   CREATININE 0.99 02/19/2021 10:43 AM   CREATININE 0.93 08/21/2020 10:19 AM   GFRNONAA 58 (L) 02/10/2020 09:57 AM   GFRAA 67 02/10/2020 09:57 AM    BMP Latest Ref Rng & Units 02/19/2021 08/21/2020 02/10/2020  Glucose 65 - 99 mg/dL 113(H) 101(H) 147(H)  BUN 8 - 27 mg/dL 20 22 15   Creatinine 0.57 - 1.00 mg/dL 0.99 0.93 0.95  BUN/Creat Ratio 12 - 28 20 24 16   Sodium 134 - 144 mmol/L 141 142 139  Potassium 3.5 - 5.2 mmol/L 3.9 4.4 4.1  Chloride 96 - 106 mmol/L 102 104 104  CO2 20 - 29 mmol/L 24 23 22   Calcium 8.7 - 10.3 mg/dL 9.7 10.0 9.1     Current antihypertensive regimen:  Metoprolol 25 mg twice daily     03/28/21  90ds               12/28/20   90ds Furosemide 40 mg daily prn      03/29/21  90ds  12/28/20   90ds  Patient verbally confirms she is taking the above medications as directed. Yes  How often are you checking your Blood Pressure? infrequently  Current home BP readings: 120/?, pt stated her BP are normally very good but she does not write them down. I advised to start writing some down for [place] so we can add them into her chart. Pt confirmed she will.   Wrist or arm cuff:Wrist  Caffeine intake:Very limited  Salt intake:Pt does not add any salt   Any readings above 180/120? No  What recent interventions/DTPs have been made by any provider to improve Blood Pressure control since last CPP Visit: No recent changes  Any recent hospitalizations or ED visits since last visit with CPP? No  What diet changes have been made to improve Blood Pressure Control?  Pt stated nothing has changed with her diet   What exercise is being  done to improve your Blood Pressure Control?  Pt stated she is not exercising right now  Adherence Review: Is the patient currently on ACE/ARB medication? Yes Does the patient have >5 day gap between last estimated fill dates? CPP to review  Care Gaps: Last annual wellness visit: 10/03/20  Star Rating Drugs:  Medication:  Last Fill: Day Supply None noted   [name], [place] Pharmacist Assistant  [phone]

## 2021-06-04 ENCOUNTER — Other Ambulatory Visit: Payer: Self-pay

## 2021-06-04 MED ORDER — APIXABAN 5 MG PO TABS: 5.0000 mg | ORAL_TABLET | Freq: Two times a day (BID) | ORAL | 2 refills | Status: DC

## 2021-06-29 ENCOUNTER — Other Ambulatory Visit: Payer: Self-pay | Admitting: Cardiology

## 2021-07-11 DIAGNOSIS — H43813 Vitreous degeneration, bilateral: Secondary | ICD-10-CM | POA: Diagnosis not present

## 2021-07-11 DIAGNOSIS — H353212 Exudative age-related macular degeneration, right eye, with inactive choroidal neovascularization: Secondary | ICD-10-CM | POA: Diagnosis not present

## 2021-07-11 DIAGNOSIS — H353121 Nonexudative age-related macular degeneration, left eye, early dry stage: Secondary | ICD-10-CM | POA: Diagnosis not present

## 2021-07-24 ENCOUNTER — Ambulatory Visit (INDEPENDENT_AMBULATORY_CARE_PROVIDER_SITE_OTHER): Payer: Medicare Other | Admitting: Physician Assistant

## 2021-07-24 ENCOUNTER — Encounter: Payer: Self-pay | Admitting: Physician Assistant

## 2021-07-24 VITALS — BP 118/78 | HR 89 | Temp 97.4°F | Ht 65.0 in | Wt 244.2 lb

## 2021-07-24 DIAGNOSIS — J40 Bronchitis, not specified as acute or chronic: Secondary | ICD-10-CM | POA: Diagnosis not present

## 2021-07-24 LAB — POCT INFLUENZA A/B
Influenza A, POC: NEGATIVE
Influenza B, POC: NEGATIVE

## 2021-07-24 LAB — POC COVID19 BINAXNOW: SARS Coronavirus 2 Ag: NEGATIVE

## 2021-07-24 MED ORDER — LEVOFLOXACIN 500 MG PO TABS: 500.0000 mg | ORAL_TABLET | Freq: Every day | ORAL | 0 refills | Status: AC

## 2021-07-24 NOTE — Progress Notes (Signed)
Acute Office Visit  Subjective:    Patient ID: Miranda Moses, female    DOB: 1940/07/19, 81 y.o.   MRN: [id number]  Chief Complaint  Patient presents with   Cough    HPI: Patient is in today for complaints of cough and congestion for the past week or more - states is taking mucinex and claritin which is helping - she denies fever Cough at times has been productive  Past Medical History:  Diagnosis Date   Angina pectoris ([place]) 02/18/2018   CAD in native artery    by CT coronary 10/2018 >> medical therapy    CHF (congestive heart failure), NYHA class I, chronic, diastolic ([place]) 9/92/4268   Chronic diastolic (congestive) heart failure ([place])    Coronary artery disease of native artery of native heart with stable angina pectoris ([place]) 08/09/2019   COVID-19 virus infection    Encounter for vaccination 10/03/2020   Family history of coronary artery disease 01/30/2018   Gastro-esophageal reflux disease without esophagitis 01/30/2018   GERD (gastroesophageal reflux disease)    Health maintenance examination 10/03/2020   History of pulmonary embolism 08/23/2019   Hyperlipidemia    Hypertension    Hypertensive heart disease with heart failure ([place]) 01/30/2018   Mixed hyperlipidemia 02/18/2018   New onset atrial fibrillation ([place])    Other fatigue 02/10/2020   Other specified menopausal and perimenopausal disorders 02/10/2020   PAD (peripheral artery disease) ([place]) 08/28/2018   PAH (pulmonary artery hypertension) ([place]) 08/28/2018   Pre-diabetes    Prediabetes 02/10/2020   Pulmonary embolus ([place])    Pulmonary hypertension ([place])    Vitamin D deficiency 08/09/2019    Past Surgical History:  Procedure Laterality Date   ABDOMINAL HYSTERECTOMY     CATARACT EXTRACTION Left 03/2014   CATARACT EXTRACTION Right 05/2014   COLONOSCOPY  11/05/2013    Family History  Problem Relation Age of Onset   Heart disease Father     Social History   Socioeconomic History   Marital status: Widowed    Spouse name:  Not on file   Number of children: 1   Years of education: Not on file   Highest education level: Not on file  Occupational History   Occupation: retired  Tobacco Use   Smoking status: Never   Smokeless tobacco: Never  Vaping Use   Vaping Use: Never used  Substance and Sexual Activity   Alcohol use: Never   Drug use: Never   Sexual activity: Not on file  Other Topics Concern   Not on file  Social History Narrative   Not on file   Social Determinants of Health   Financial Resource Strain: Not on file  Food Insecurity: Not on file  Transportation Needs: Not on file  Physical Activity: Not on file  Stress: Not on file  Social Connections: Not on file  Intimate Partner Violence: Not on file    Outpatient Medications Prior to Visit  Medication Sig Dispense Refill   apixaban (ELIQUIS) 5 MG TABS tablet Take 1 tablet (5 mg total) by mouth 2 (two) times daily. 60 tablet 2   Ascorbic Acid (VITAMIN C) 100 MG tablet Take 100 mg by mouth daily.     atorvastatin (LIPITOR) 20 MG tablet TAKE 1 TABLET(20 MG) BY MOUTH EVERY DAY 90 tablet 1   benzonatate (TESSALON) 100 MG capsule Take 2 capsules (200 mg total) by mouth 3 (three) times daily as needed for cough. 20 capsule 0   calcium carbonate (OSCAL) 1500 (600 Ca) MG  TABS tablet Take 600 mg of elemental calcium by mouth daily.     Cholecalciferol (VITAMIN D3 PO) Take 2 capsules by mouth daily.     furosemide (LASIX) 40 MG tablet TAKE 1 TABLET(40 MG) BY MOUTH DAILY AS NEEDED FOR SWELLING 90 tablet 1   isosorbide mononitrate (IMDUR) 30 MG 24 hr tablet TAKE 1 TABLET BY MOUTH EVERY DAY 90 tablet 1   loratadine (CLARITIN) 10 MG tablet Take 1 tablet (10 mg total) by mouth daily. 90 tablet 1   metoprolol tartrate (LOPRESSOR) 25 MG tablet TAKE 1 TABLET(25 MG) BY MOUTH TWICE DAILY 180 tablet 3   Multiple Vitamin (MULTIVITAMIN WITH MINERALS) TABS tablet Take 1 tablet by mouth daily.     Multiple Vitamins-Minerals (OCUVITE PRESERVISION PO) Take 1  tablet by mouth in the morning and at bedtime.     omeprazole (PRILOSEC) 20 MG capsule TAKE ONE CAPSULE BY MOUTH EVERY DAY 180 capsule 1   OVER THE COUNTER MEDICATION Take 1 tablet by mouth daily. Circulation vitamin from QVC -     triamcinolone (NASACORT) 55 MCG/ACT AERO nasal inhaler Place 1 spray into the nose daily as needed for allergies (allergies).     vitamin E 200 UNIT capsule Take 200 Units by mouth daily.     No facility-administered medications prior to visit.    Allergies  Allergen Reactions   Clarithromycin     Numbness of extremities   Codeine Other (See Comments)    Caused pain   Penicillins Rash    Did it involve swelling of the face/tongue/throat, SOB, or low BP? No Did it involve sudden or severe rash/hives, skin peeling, or any reaction on the inside of your mouth or nose? Yes Did you need to seek medical attention at a hospital or doctor's office? Yes When did it last happen?      20 years If all above answers are NO, may proceed with cephalosporin use.     Review of Systems CONSTITUTIONAL: Negative for chills, fatigue, fever, unintentional weight gain and unintentional weight loss.  E/N/T: see HPI CARDIOVASCULAR: Negative for chest pain, dizziness, palpitations and pedal edema.  RESPIRATORY: see HPI GASTROINTESTINAL: Negative for abdominal pain, acid reflux symptoms, constipation, diarrhea, nausea and vomiting.          Objective:    Physical Exam PHYSICAL EXAM:   VS: BP 118/78    Pulse 89    Temp (!) 97.4 F (36.3 C)    Ht 5' 5"  (1.651 m)    Wt 244 lb 3.2 oz (110.8 kg)    SpO2 98%    BMI 40.64 kg/m   GEN: Well nourished, well developed, in no acute distress  Oropharynx - mild erythema/pnd Cardiac: RRR; no murmurs, rubs, or gallops,no edema -  Respiratory: faint rhonchi noted - clears with cough - no wheezing Skin: warm and dry, no rash   Office Visit on 07/24/2021  Component Date Value Ref Range Status   SARS Coronavirus 2 Ag 07/24/2021  Negative  Negative Final   Influenza A, POC 07/24/2021 Negative  Negative Final   Influenza B, POC 07/24/2021 Negative  Negative Final     BP 118/78    Pulse 89    Temp (!) 97.4 F (36.3 C)    Ht 5' 5"  (1.651 m)    Wt 244 lb 3.2 oz (110.8 kg)    SpO2 98%    BMI 40.64 kg/m  Wt Readings from Last 3 Encounters:  07/24/21 244 lb 3.2 oz (110.8 kg)  03/02/21 249 lb 3.2 oz (113 kg)  02/19/21 241 lb (109.3 kg)    Health Maintenance Due  Topic Date Due   TETANUS/TDAP  Never done   Pneumonia Vaccine 24+ Years old (2 - PPSV23 if available, else PCV20) 04/14/2015   Zoster Vaccines- Shingrix (2 of 2) 12/20/2020   MAMMOGRAM  02/24/2021    There are no preventive care reminders to display for this patient.   Lab Results  Component Value Date   TSH 1.600 02/10/2020   Lab Results  Component Value Date   WBC 5.0 02/19/2021   HGB 12.9 02/19/2021   HCT 40.5 02/19/2021   MCV 85 02/19/2021   PLT 191 02/19/2021   Lab Results  Component Value Date   NA 141 02/19/2021   K 3.9 02/19/2021   CO2 24 02/19/2021   GLUCOSE 113 (H) 02/19/2021   BUN 20 02/19/2021   CREATININE 0.99 02/19/2021   BILITOT 0.8 02/19/2021   ALKPHOS 137 (H) 02/19/2021   AST 16 02/19/2021   ALT 12 02/19/2021   PROT 6.7 02/19/2021   ALBUMIN 3.9 02/19/2021   CALCIUM 9.7 02/19/2021   ANIONGAP 10 07/29/2019   EGFR 58 (L) 02/19/2021   Lab Results  Component Value Date   CHOL 149 02/19/2021   Lab Results  Component Value Date   HDL 53 02/19/2021   Lab Results  Component Value Date   LDLCALC 79 02/19/2021   Lab Results  Component Value Date   TRIG 90 02/19/2021   Lab Results  Component Value Date   CHOLHDL 2.8 02/19/2021   Lab Results  Component Value Date   HGBA1C 6.3 (H) 02/19/2021       Assessment & Plan:   Problem List Items Addressed This Visit   None Visit Diagnoses     Bronchitis    -  Primary   Relevant Medications   levofloxacin (LEVAQUIN) 500 MG tablet Continue claritin and mucinex       Meds ordered this encounter  Medications   levofloxacin (LEVAQUIN) 500 MG tablet    Sig: Take 1 tablet (500 mg total) by mouth daily for 10 days.    Dispense:  10 tablet    Refill:  0    Order Specific Question:   Supervising Provider    Answer:   Shelton Silvas    No orders of the defined types were placed in this encounter.    Follow-up: Return if symptoms worsen or fail to improve.  An After Visit Summary was printed and given to the patient.  Yetta Flock [facility] [phone]

## 2021-08-09 ENCOUNTER — Telehealth: Payer: Self-pay

## 2021-08-09 NOTE — Chronic Care Management (AMB) (Signed)
° ° °  Called [facility], No answer, no voicemail to leave message of appointment on 08-14-2021 at 11:00 via telephone visit with Arizona Constable, Pharm D.   Care Gaps: Tdap overdue Pneumonia vaccine overdue Shingrix overdue Yearly mammogram overdue  Star Rating Drug: None  Any gaps in medications fill history? No  [place] Pharmacist Assistant [phone]

## 2021-08-14 ENCOUNTER — Other Ambulatory Visit: Payer: Self-pay

## 2021-08-14 ENCOUNTER — Ambulatory Visit (INDEPENDENT_AMBULATORY_CARE_PROVIDER_SITE_OTHER): Payer: Medicare Other

## 2021-08-14 DIAGNOSIS — E782 Mixed hyperlipidemia: Secondary | ICD-10-CM

## 2021-08-14 DIAGNOSIS — R7303 Prediabetes: Secondary | ICD-10-CM

## 2021-08-14 DIAGNOSIS — I110 Hypertensive heart disease with heart failure: Secondary | ICD-10-CM

## 2021-08-14 NOTE — Patient Instructions (Signed)
Visit Information   Goals Addressed   None    Patient Care Plan: CCM Pharmacy Care Plan     Problem Identified: afib, cad, chf, gerd   Priority: High  Onset Date: 08/24/2020     Long-Range Goal: Disease Management   Start Date: 08/24/2020  Expected End Date: 08/24/2021  Recent Progress: On track  Priority: High  Note:    Current Barriers:  Unable to independently afford treatment regimen  Pharmacist Clinical Goal(s):  Over the next 90 days, patient will verbalize ability to afford treatment regimen through collaboration with PharmD and provider.   Interventions: 1:1 collaboration with [name], PA-C regarding development and update of comprehensive plan of care as evidenced by provider attestation and co-signature Inter-disciplinary care team collaboration (see longitudinal plan of care) Comprehensive medication review performed; medication list updated in electronic medical record  Hyperlipidemia: (LDL goal < 55) The 10-year ASCVD risk score (Arnett DK, et al., 2019) is: 26.1%   Values used to calculate the score:     Age: 27 years     Sex: Female     Is Non-Hispanic African American: No     Diabetic: No     Tobacco smoker: No     Systolic Blood Pressure: 419 mmHg     Is BP treated: Yes     HDL Cholesterol: 53 mg/dL     Total Cholesterol: 149 mg/dL Lab Results  Component Value Date   CHOL 149 02/19/2021   CHOL 158 08/21/2020   CHOL 147 02/10/2020   Lab Results  Component Value Date   HDL 53 02/19/2021   HDL 54 08/21/2020   HDL 39 (L) 02/10/2020   Lab Results  Component Value Date   LDLCALC 79 02/19/2021   LDLCALC 87 08/21/2020   LDLCALC 86 02/10/2020   Lab Results  Component Value Date   TRIG 90 02/19/2021   TRIG 91 08/21/2020   TRIG 122 02/10/2020   Lab Results  Component Value Date   CHOLHDL 2.8 02/19/2021   CHOLHDL 2.9 08/21/2020   CHOLHDL 3.8 02/10/2020  No results found for: LDLDIRECT -Not ideally controlled -Current  treatment: Atorvastatin 20 mg daily Appropriate, Effective, Safe, Accessible -Medications previously tried: none reported  -Current dietary patterns: avoids salt. Eats out. Doesn't have a huge appetite. Likes fish and cheeseburgers from restaurants.  -Current exercise habits: no regular.  -Educated on Cholesterol goals;  Benefits of statin for ASCVD risk reduction; Importance of limiting foods high in cholesterol; Exercise goal of 150 minutes per week; -Counseled on diet and exercise extensively Plan: At goal,  patient stable/ symptoms controlled   Diabetes (A1c goal <7%) Lab Results  Component Value Date   HGBA1C 6.3 (H) 02/19/2021   HGBA1C 7.1 (H) 02/10/2020   HGBA1C 6.7 (H) 11/08/2019   Lab Results  Component Value Date   LDLCALC 79 02/19/2021   CREATININE 0.99 02/19/2021   Lab Results  Component Value Date   NA 141 02/19/2021   K 3.9 02/19/2021   CREATININE 0.99 02/19/2021   EGFR 58 (L) 02/19/2021   GFRNONAA 58 (L) 02/10/2020   GLUCOSE 113 (H) 02/19/2021   Lab Results  Component Value Date   WBC 5.0 02/19/2021   HGB 12.9 02/19/2021   HCT 40.5 02/19/2021   MCV 85 02/19/2021   PLT 191 02/19/2021  -Controlled -Current medications: Diet and lifestyle  -Medications previously tried: none  -Current home glucose readings fasting glucose: doesn't check post prandial glucose: doesn't check -Denies hypoglycemic/hyperglycemic symptoms -Current meal patterns:  breakfast: eggs  Lunch and dinner: enjoys grabbing a hamburger with her grandson   -Current exercise: no regular exercise  -Educated onA1c and blood sugar goals; Exercise goal of 150 minutes per week; Carbohydrate counting and/or plate method -Counseled to check feet daily and get yearly eye exams -Counseled on diet and exercise extensively  Atrial Fibrillation (Goal: prevent stroke and major bleeding) -Controlled -CHADSVASC: 7 -Current treatment: Rate control:  Metoprolol tartrate 25 mg bid Appropriate,  Effective, Safe, Accessible Anticoagulation:  Eliquis 5 mg bid Appropriate, Effective, Safe, Accessible -Medications previously tried: none reported -Home BP and HR readings:   04/17/21: 112/70 -Counseled on increased risk of stroke due to Afib and benefits of anticoagulation for stroke prevention; importance of adherence to anticoagulant exactly as prescribed; avoidance of NSAIDs due to increased bleeding risk with anticoagulants; -Recommended to continue current medication April 2022: Collaborated with [facility] to fill Eliquis. Patient aware that copay and deductible will be $522 this year. We will be able to complete patient assistance at that time due to meting 3% out of pocket requirement. Patient will stop in office to sign completed Eliquis form and bring proof of social security income and out of pocket cost on medications for 2022. Pharmacist faxed prior authorization to Dr. Joya Gaskins office for patient.  Prior authorization approved 08/28/2020. Pharmacist updated patient via phone and requested fill of medication through [facility].  Feb 2023: Patient states meds are going well and no issues. Will update PAP in December  Heart Failure (Goal: manage symptoms and prevent exacerbations) -Controlled -Last ejection fraction: 60-65%  (Date: 09/2018) -HF type: Hypertensive Heart disease with heart failure -NYHA Class: I (no actitivty limitation) -Current treatment: furosemide 40 mg daily prn edema Appropriate, Effective, Safe, Accessible Isosorbide mn 30 mg daily Appropriate, Effective, Safe, Accessible Metoprolol tartrate 25 mg bid Appropriate, Effective, Safe, Accessible -Medications previously tried: none reported  -Current home BP/HR readings: in frequently checking but 110/55 and 107/63 mmHg -Current dietary habits: eats out several times each week. Doesn't salt food. Minimal appetite.  -Current exercise habits: no regular exercise -Educated on Benefits of medications for managing  symptoms and prolonging life Importance of weighing daily; if you gain more than 3 pounds in one day or 5 pounds in one week, contact provider Importance of blood pressure control -Counseled on diet and exercise extensively     Osteoporosis / Osteopenia (Goal minimize risk of fractures/breaks) -Controlled -Last DEXA Scan: 09/21   T-Score femoral neck: -1.4  T-Score forearm radius: -1.8  10-year probability of major osteoporotic fracture: 17.7  10-year probability of hip fracture: 8.8 -Patient is a candidate for pharmacologic treatment due to T-Score -1.0 to -2.5 and 10-year risk of hip fracture > 3% -Current treatment  Vitamin D daily Appropriate, Effective, Safe, Accessible Calcium 600 mg daily Appropriate, Effective, Safe, Accessible -Medications previously tried: none reported  -Recommend [phone] units of vitamin D daily. Recommend 1200 mg of calcium daily from dietary and supplemental sources. Recommend weight-bearing and muscle strengthening exercises for building and maintaining bone density. -Counseled on diet and exercise extensively Recommended Prolia but patient declined at this time. Discussed importance of calcium citrate due to PPI use.   GERD (Goal: minimize symptoms of reflux ) -Controlled -Current treatment  Omeprazole 20 mg daily Appropriate, Effective, Safe, Accessible -Medications previously tried: none reported  October 2022: patient denied any Hx of bleeds, counseled on trying to take every other day to prevent bone degradation   Patient Goals/Self-Care Activities Over the next 90 days, patient will:  -  take medications as prescribed focus on medication adherence by using pill box target a minimum of 150 minutes of moderate intensity exercise weekly  Follow Up Plan: Telephone follow up appointment with care management team member scheduled for: Oct 2023  Arizona Constable, [place].D. - [phone]      Ms. Grecco was given information about Chronic Care  Management services today including:  CCM service includes personalized support from designated clinical staff supervised by her physician, including individualized plan of care and coordination with other care providers 24/7 contact phone numbers for assistance for urgent and routine care needs. Standard insurance, coinsurance, copays and deductibles apply for chronic care management only during months in which we provide at least 20 minutes of these services. Most insurances cover these services at 100%, however patients may be responsible for any copay, coinsurance and/or deductible if applicable. This service may help you avoid the need for more expensive face-to-face services. Only one practitioner may furnish and bill the service in a calendar month. The patient may stop CCM services at any time (effective at the end of the month) by phone call to the office staff.  Patient agreed to services and verbal consent obtained.   The patient verbalized understanding of instructions, educational materials, and care plan provided today and declined offer to receive copy of patient instructions, educational materials, and care plan.  The pharmacy team will reach out to the patient again over the next 60 days.   Lane Hacker, [place]

## 2021-08-14 NOTE — Progress Notes (Signed)
Chronic Care Management Pharmacy Note  08/14/2021 Name:  Miranda Moses MRN:  [id number] DOB:  1941-02-15  Summary: Pleasant 81 year old female presents for F/U CCM visit. She is widowed, husband passed in 2019. She has a daughter and a grandson who is in [place] that she goes out to eat for lunch with every day, she tells me it's the highlight of her day.   Plan Recommendations:  Will f/u with PAP in December  Subjective: Miranda Moses is an 81 y.o. year old female who is a primary patient of [name], [place].  The CCM team was consulted for assistance with disease management and care coordination needs.    Engaged with patient by telephone for follow up visit in response to provider referral for pharmacy case management and/or care coordination services.   Consent to Services:  The patient was given information about Chronic Care Management services, agreed to services, and gave verbal consent prior to initiation of services.  Please see initial visit note for detailed documentation.   Patient Care Team: [name], Hershal Coria as PCP - General (Physician Assistant) Lane Hacker, [facility] (Pharmacist)  Recent office visits: 08/21/2020 - labs stable. No medication changes.  07/31/2020 - viral upper respiratory infection - loratadine, benzonotate, mucinex and flonase.  Recent consult visits: None since last ccm visit   Hospital visits: None in previous 6 months  Objective:  Lab Results  Component Value Date   CREATININE 0.99 02/19/2021   BUN 20 02/19/2021   GFRNONAA 58 (L) 02/10/2020   GFRAA 67 02/10/2020   NA 141 02/19/2021   K 3.9 02/19/2021   CALCIUM 9.7 02/19/2021   CO2 24 02/19/2021    Lab Results  Component Value Date/Time   HGBA1C 6.3 (H) 02/19/2021 10:43 AM   HGBA1C 7.1 (H) 02/10/2020 09:57 AM    Last diabetic Eye exam: No results found for: HMDIABEYEEXA  Last diabetic Foot exam: No results found for: HMDIABFOOTEX   Lab Results  Component Value Date   CHOL 149  02/19/2021   HDL 53 02/19/2021   LDLCALC 79 02/19/2021   TRIG 90 02/19/2021   CHOLHDL 2.8 02/19/2021    Hepatic Function Latest Ref Rng & Units 02/19/2021 08/21/2020 02/10/2020  Total Protein 6.0 - 8.5 g/dL 6.7 6.8 6.2  Albumin 3.7 - 4.7 g/dL 3.9 4.2 3.8  AST 0 - 40 IU/L 16 17 13   ALT 0 - 32 IU/L 12 13 14   Alk Phosphatase 44 - 121 IU/L 137(H) 117 111  Total Bilirubin 0.0 - 1.2 mg/dL 0.8 0.9 0.7  Bilirubin, Direct 0.00 - 0.40 mg/dL - - -    Lab Results  Component Value Date/Time   TSH 1.600 02/10/2020 09:57 AM   TSH 1.360 10/08/2019 10:01 AM    CBC Latest Ref Rng & Units 02/19/2021 08/21/2020 02/10/2020  WBC 3.4 - 10.8 x10E3/uL 5.0 4.3 4.8  Hemoglobin 11.1 - 15.9 g/dL 12.9 13.3 11.7  Hematocrit 34.0 - 46.6 % 40.5 42.2 37.8  Platelets 150 - 450 x10E3/uL 191 180 190    Lab Results  Component Value Date/Time   VD25OH 61.1 02/19/2021 10:43 AM   VD25OH 69.4 08/21/2020 10:19 AM    Clinical ASCVD: Yes  The 10-year ASCVD risk score (Arnett DK, et al., 2019) is: 24.6%   Values used to calculate the score:     Age: 89 years     Sex: Female     Is Non-Hispanic African American: No     Diabetic: No  Tobacco smoker: No     Systolic Blood Pressure: 250 mmHg     Is BP treated: Yes     HDL Cholesterol: 53 mg/dL     Total Cholesterol: 149 mg/dL    Depression screen [facility] 2/9 10/03/2020  Decreased Interest 0  Down, Depressed, Hopeless 0  PHQ - 2 Score 0     CHA2DS2-VASc Score = 7  The patient's score is based upon: CHF History: 1 HTN History: 1 Diabetes History: 1 Stroke History: 0 Vascular Disease History: 1 Age Score: 2 Gender Score: 1    Social History   Tobacco Use  Smoking Status Never  Smokeless Tobacco Never   BP Readings from Last 3 Encounters:  07/24/21 118/78  03/02/21 122/76  02/19/21 112/70   Pulse Readings from Last 3 Encounters:  07/24/21 89  03/02/21 (!) 108  02/19/21 90   Wt Readings from Last 3 Encounters:  07/24/21 244 lb 3.2 oz (110.8 kg)   03/02/21 249 lb 3.2 oz (113 kg)  02/19/21 241 lb (109.3 kg)    Assessment/Interventions: Review of patient past medical history, allergies, medications, health status, including review of consultants reports, laboratory and other test data, was performed as part of comprehensive evaluation and provision of chronic care management services.   SDOH:  (Social Determinants of Health) assessments and interventions performed: Yes SDOH Interventions    Flowsheet Row Most Recent Value  SDOH Interventions   Financial Strain Interventions Intervention Not Indicated  Transportation Interventions Intervention Not Indicated       CCM Care Plan  Allergies  Allergen Reactions   Clarithromycin     Numbness of extremities   Codeine Other (See Comments)    Caused pain   Penicillins Rash    Did it involve swelling of the face/tongue/throat, SOB, or low BP? No Did it involve sudden or severe rash/hives, skin peeling, or any reaction on the inside of your mouth or nose? Yes Did you need to seek medical attention at a hospital or doctor's office? Yes When did it last happen?      20 years If all above answers are NO, may proceed with cephalosporin use.     Medications Reviewed Today     Reviewed by Lane Hacker, [facility] (Pharmacist) on 08/14/21 at 1131  Med List Status: <None>   Medication Order Taking? Sig Documenting Provider Last Dose Status Informant  apixaban (ELIQUIS) 5 MG TABS tablet [id number] No Take 1 tablet (5 mg total) by mouth 2 (two) times daily. [name], PA-C Taking Active   Ascorbic Acid (VITAMIN C) 100 MG tablet [id number] No Take 100 mg by mouth daily. [provider] Taking Active   atorvastatin (LIPITOR) 20 MG tablet [id number] No TAKE 1 TABLET(20 MG) BY MOUTH EVERY DAY [name], PA-C Taking Active   benzonatate (TESSALON) 100 MG capsule [id number] No Take 2 capsules (200 mg total) by mouth 3 (three) times daily as needed for cough. Rip Harbour, NP  Taking Active   calcium carbonate (OSCAL) 1500 (600 Ca) MG TABS tablet [id number] No Take 600 mg of elemental calcium by mouth daily. [provider] Taking Active   Cholecalciferol (VITAMIN D3 PO) [id number] No Take 2 capsules by mouth daily. [provider] Taking Active Self  furosemide (LASIX) 40 MG tablet [id number] No TAKE 1 TABLET(40 MG) BY MOUTH DAILY AS NEEDED FOR SWELLING [name], PA-C Taking Active   isosorbide mononitrate (IMDUR) 30 MG 24 hr tablet [id number] No TAKE 1 TABLET BY MOUTH EVERY DAY  Richardo Priest, MD Taking Active   loratadine (CLARITIN) 10 MG tablet [id number] No Take 1 tablet (10 mg total) by mouth daily. Rip Harbour, NP Taking Active   metoprolol tartrate (LOPRESSOR) 25 MG tablet [id number] No TAKE 1 TABLET(25 MG) BY MOUTH TWICE DAILY [name] Hilton Cork, MD Taking Active   Multiple Vitamin (MULTIVITAMIN WITH MINERALS) TABS tablet [id number] No Take 1 tablet by mouth daily. [provider] Taking Active Self  Multiple Vitamins-Minerals (OCUVITE PRESERVISION PO) [id number] No Take 1 tablet by mouth in the morning and at bedtime. [provider] Taking Active   omeprazole (PRILOSEC) 20 MG capsule [id number] No TAKE ONE CAPSULE BY MOUTH EVERY DAY [name], PA-C Taking Active   OVER THE COUNTER MEDICATION [id number] No Take 1 tablet by mouth daily. Circulation vitamin from [facility] - [provider] Taking Active   triamcinolone (NASACORT) 55 MCG/ACT AERO nasal inhaler [id number] No Place 1 spray into the nose daily as needed for allergies (allergies). [provider] Taking Active Self  vitamin E 200 UNIT capsule [id number] No Take 200 Units by mouth daily. [provider] Taking Active             Patient Active Problem List   Diagnosis Date Noted   Health maintenance examination 10/03/2020   Encounter for vaccination 10/03/2020   Pulmonary hypertension (HCC)    Pre-diabetes    Hypertension    Hyperlipidemia     GERD (gastroesophageal reflux disease)    Chronic diastolic (congestive) heart failure (HCC)    CAD in native artery    Prediabetes 02/10/2020   Other fatigue 02/10/2020   Other specified menopausal and perimenopausal disorders 02/10/2020   History of pulmonary embolism 08/23/2019   CHF (congestive heart failure), NYHA class I, chronic, diastolic ([place]) 58/83/2549   Vitamin D deficiency 08/09/2019   Coronary artery disease of native artery of native heart with stable angina pectoris ([place]) 08/09/2019   New onset atrial fibrillation (HCC)    Pulmonary embolus ([place])    COVID-19 virus infection    PAH (pulmonary artery hypertension) ([place]) 08/28/2018   PAD (peripheral artery disease) ([place]) 08/28/2018   Angina pectoris ([place]) 02/18/2018   Mixed hyperlipidemia 02/18/2018   Family history of coronary artery disease 01/30/2018   Hypertensive heart disease with heart failure ([place]) 01/30/2018   Gastro-esophageal reflux disease without esophagitis 01/30/2018    Immunization History  Administered Date(s) Administered   Fluad Quad(high Dose 65+) 03/23/2020, 02/19/2021   Influenza-Unspecified 03/13/2017, 06/24/2018, 05/07/2019   PFIZER(Purple Top)SARS-COV-2 Vaccination 12/09/2019, 12/28/2019   Pneumococcal Conjugate-13 04/13/2014   Pneumococcal-Unspecified 05/31/2009   Zoster Recombinat (Shingrix) 10/25/2020   Zoster, Live 02/11/2014    Conditions to be addressed/monitored:  Heart Failure, Coronary Artery Disease and GERD  Care Plan : CCM Pharmacy Care Plan  Updates made by Lane Hacker, RPH since 08/14/2021 12:00 AM     Problem: afib, cad, chf, gerd   Priority: High  Onset Date: 08/24/2020     Long-Range Goal: Disease Management   Start Date: 08/24/2020  Expected End Date: 08/24/2021  Recent Progress: On track  Priority: High  Note:    Current Barriers:  Unable to independently afford treatment regimen  Pharmacist Clinical Goal(s):  Over the next 90 days, patient will  verbalize ability to afford treatment regimen through collaboration with PharmD and provider.   Interventions: 1:1 collaboration with [name], PA-C regarding development and update of comprehensive plan of care as evidenced by provider attestation and co-signature Inter-disciplinary care team collaboration (  see longitudinal plan of care) Comprehensive medication review performed; medication list updated in electronic medical record  Hyperlipidemia: (LDL goal < 55) The 10-year ASCVD risk score (Arnett DK, et al., 2019) is: 26.1%   Values used to calculate the score:     Age: 47 years     Sex: Female     Is Non-Hispanic African American: No     Diabetic: No     Tobacco smoker: No     Systolic Blood Pressure: 212 mmHg     Is BP treated: Yes     HDL Cholesterol: 53 mg/dL     Total Cholesterol: 149 mg/dL Lab Results  Component Value Date   CHOL 149 02/19/2021   CHOL 158 08/21/2020   CHOL 147 02/10/2020   Lab Results  Component Value Date   HDL 53 02/19/2021   HDL 54 08/21/2020   HDL 39 (L) 02/10/2020   Lab Results  Component Value Date   LDLCALC 79 02/19/2021   LDLCALC 87 08/21/2020   LDLCALC 86 02/10/2020   Lab Results  Component Value Date   TRIG 90 02/19/2021   TRIG 91 08/21/2020   TRIG 122 02/10/2020   Lab Results  Component Value Date   CHOLHDL 2.8 02/19/2021   CHOLHDL 2.9 08/21/2020   CHOLHDL 3.8 02/10/2020  No results found for: LDLDIRECT -Not ideally controlled -Current treatment: Atorvastatin 20 mg daily Appropriate, Effective, Safe, Accessible -Medications previously tried: none reported  -Current dietary patterns: avoids salt. Eats out. Doesn't have a huge appetite. Likes fish and cheeseburgers from restaurants.  -Current exercise habits: no regular.  -Educated on Cholesterol goals;  Benefits of statin for ASCVD risk reduction; Importance of limiting foods high in cholesterol; Exercise goal of 150 minutes per week; -Counseled on diet and exercise  extensively Plan: At goal,  patient stable/ symptoms controlled   Diabetes (A1c goal <7%) Lab Results  Component Value Date   HGBA1C 6.3 (H) 02/19/2021   HGBA1C 7.1 (H) 02/10/2020   HGBA1C 6.7 (H) 11/08/2019   Lab Results  Component Value Date   LDLCALC 79 02/19/2021   CREATININE 0.99 02/19/2021   Lab Results  Component Value Date   NA 141 02/19/2021   K 3.9 02/19/2021   CREATININE 0.99 02/19/2021   EGFR 58 (L) 02/19/2021   GFRNONAA 58 (L) 02/10/2020   GLUCOSE 113 (H) 02/19/2021   Lab Results  Component Value Date   WBC 5.0 02/19/2021   HGB 12.9 02/19/2021   HCT 40.5 02/19/2021   MCV 85 02/19/2021   PLT 191 02/19/2021  -Controlled -Current medications: Diet and lifestyle  -Medications previously tried: none  -Current home glucose readings fasting glucose: doesn't check post prandial glucose: doesn't check -Denies hypoglycemic/hyperglycemic symptoms -Current meal patterns:  breakfast: eggs  Lunch and dinner: enjoys grabbing a hamburger with her grandson   -Current exercise: no regular exercise  -Educated onA1c and blood sugar goals; Exercise goal of 150 minutes per week; Carbohydrate counting and/or plate method -Counseled to check feet daily and get yearly eye exams -Counseled on diet and exercise extensively  Atrial Fibrillation (Goal: prevent stroke and major bleeding) -Controlled -CHADSVASC: 7 -Current treatment: Rate control:  Metoprolol tartrate 25 mg bid Appropriate, Effective, Safe, Accessible Anticoagulation:  Eliquis 5 mg bid Appropriate, Effective, Safe, Accessible -Medications previously tried: none reported -Home BP and HR readings:   04/17/21: 112/70 -Counseled on increased risk of stroke due to Afib and benefits of anticoagulation for stroke prevention; importance of adherence to anticoagulant exactly as prescribed; avoidance  of NSAIDs due to increased bleeding risk with anticoagulants; -Recommended to continue current medication April  2022: Collaborated with [facility] to fill Eliquis. Patient aware that copay and deductible will be $522 this year. We will be able to complete patient assistance at that time due to meting 3% out of pocket requirement. Patient will stop in office to sign completed Eliquis form and bring proof of social security income and out of pocket cost on medications for 2022. Pharmacist faxed prior authorization to Dr. Joya Gaskins office for patient.  Prior authorization approved 08/28/2020. Pharmacist updated patient via phone and requested fill of medication through [place].  Feb 2023: Patient states meds are going well and no issues. Will update PAP in December  Heart Failure (Goal: manage symptoms and prevent exacerbations) -Controlled -Last ejection fraction: 60-65%  (Date: 09/2018) -HF type: Hypertensive Heart disease with heart failure -NYHA Class: I (no actitivty limitation) -Current treatment: furosemide 40 mg daily prn edema Appropriate, Effective, Safe, Accessible Isosorbide mn 30 mg daily Appropriate, Effective, Safe, Accessible Metoprolol tartrate 25 mg bid Appropriate, Effective, Safe, Accessible -Medications previously tried: none reported  -Current home BP/HR readings: in frequently checking but 110/55 and 107/63 mmHg -Current dietary habits: eats out several times each week. Doesn't salt food. Minimal appetite.  -Current exercise habits: no regular exercise -Educated on Benefits of medications for managing symptoms and prolonging life Importance of weighing daily; if you gain more than 3 pounds in one day or 5 pounds in one week, contact provider Importance of blood pressure control -Counseled on diet and exercise extensively     Osteoporosis / Osteopenia (Goal minimize risk of fractures/breaks) -Controlled -Last DEXA Scan: 09/21   T-Score femoral neck: -1.4  T-Score forearm radius: -1.8  10-year probability of major osteoporotic fracture: 17.7  10-year probability of hip fracture:  8.8 -Patient is a candidate for pharmacologic treatment due to T-Score -1.0 to -2.5 and 10-year risk of hip fracture > 3% -Current treatment  Vitamin D daily Appropriate, Effective, Safe, Accessible Calcium 600 mg daily Appropriate, Effective, Safe, Accessible -Medications previously tried: none reported  -Recommend [phone] units of vitamin D daily. Recommend 1200 mg of calcium daily from dietary and supplemental sources. Recommend weight-bearing and muscle strengthening exercises for building and maintaining bone density. -Counseled on diet and exercise extensively Recommended Prolia but patient declined at this time. Discussed importance of calcium citrate due to PPI use.   GERD (Goal: minimize symptoms of reflux ) -Controlled -Current treatment  Omeprazole 20 mg daily Appropriate, Effective, Safe, Accessible -Medications previously tried: none reported  October 2022: patient denied any Hx of bleeds, counseled on trying to take every other day to prevent bone degradation   Patient Goals/Self-Care Activities Over the next 90 days, patient will:  - take medications as prescribed focus on medication adherence by using pill box target a minimum of 150 minutes of moderate intensity exercise weekly  Follow Up Plan: Telephone follow up appointment with care management team member scheduled for: Oct 2023  [place] Constable, Pharm.D. - [phone]       Medication Assistance: Application for Low Income Subsidy  medication assistance program. in process.  Anticipated assistance start date uncertain .  See plan of care for additional detail. Patient was denied Low Income Subsidy. Pharmacist coordinating application for Eliquis patient assistance through [employer].   Patient's preferred pharmacy is:  [facility] [place], [place]  [address] Phone: [phone] Fax:  [phone]   Uses pill box? Yes Pt endorses good compliance but indicates she has forgotten to take medication out of her pill box at night occasionally. We discussed keeping it in a location where she will see it easily. Patient also encouraged to set a reminder alarm to take medication if needed.   We discussed: Current pharmacy is preferred with insurance plan and patient is satisfied with pharmacy services Patient decided to: Continue current medication management strategy  Care Plan and Follow Up Patient Decision:  Patient agrees to Care Plan and Follow-up.  Plan: Telephone follow up appointment with care management team member scheduled for:  October 2023  [place] Constable, [place].D. - [phone]

## 2021-08-21 ENCOUNTER — Encounter: Payer: Self-pay | Admitting: Physician Assistant

## 2021-08-21 ENCOUNTER — Other Ambulatory Visit: Payer: Self-pay

## 2021-08-21 ENCOUNTER — Ambulatory Visit (INDEPENDENT_AMBULATORY_CARE_PROVIDER_SITE_OTHER): Payer: Medicare Other | Admitting: Physician Assistant

## 2021-08-21 VITALS — BP 112/70 | HR 82 | Temp 97.5°F | Ht 65.0 in | Wt 242.0 lb

## 2021-08-21 DIAGNOSIS — I25118 Atherosclerotic heart disease of native coronary artery with other forms of angina pectoris: Secondary | ICD-10-CM

## 2021-08-21 DIAGNOSIS — E1159 Type 2 diabetes mellitus with other circulatory complications: Secondary | ICD-10-CM | POA: Diagnosis not present

## 2021-08-21 DIAGNOSIS — R7303 Prediabetes: Secondary | ICD-10-CM | POA: Diagnosis not present

## 2021-08-21 DIAGNOSIS — E782 Mixed hyperlipidemia: Secondary | ICD-10-CM

## 2021-08-21 DIAGNOSIS — M8580 Other specified disorders of bone density and structure, unspecified site: Secondary | ICD-10-CM | POA: Diagnosis not present

## 2021-08-21 DIAGNOSIS — I509 Heart failure, unspecified: Secondary | ICD-10-CM | POA: Diagnosis not present

## 2021-08-21 DIAGNOSIS — Z1231 Encounter for screening mammogram for malignant neoplasm of breast: Secondary | ICD-10-CM | POA: Diagnosis not present

## 2021-08-21 DIAGNOSIS — E559 Vitamin D deficiency, unspecified: Secondary | ICD-10-CM

## 2021-08-21 DIAGNOSIS — I4811 Longstanding persistent atrial fibrillation: Secondary | ICD-10-CM

## 2021-08-21 DIAGNOSIS — E785 Hyperlipidemia, unspecified: Secondary | ICD-10-CM | POA: Diagnosis not present

## 2021-08-21 DIAGNOSIS — I110 Hypertensive heart disease with heart failure: Secondary | ICD-10-CM | POA: Diagnosis not present

## 2021-08-21 DIAGNOSIS — M810 Age-related osteoporosis without current pathological fracture: Secondary | ICD-10-CM | POA: Diagnosis not present

## 2021-08-21 DIAGNOSIS — Z23 Encounter for immunization: Secondary | ICD-10-CM | POA: Diagnosis not present

## 2021-08-21 NOTE — Progress Notes (Signed)
Established Patient Office Visit  Subjective:  Patient ID: Miranda Moses, female    DOB: 01-Jan-1941  Age: 81 y.o. MRN: [id number]  CC:  Chief Complaint  Patient presents with   Hyperlipidemia    HPI Miranda Moses presents for chronic follow up hyperlipidemia  Mixed hyperlipidemia  Pt presents with hyperlipidemia.  Compliance with treatment has been good; The patient is compliant with medications, maintains a low cholesterol diet , follows up as directed ,  . The patient denies experiencing any hypercholesterolemia related symptoms. . Pt currently on atorvastatin 50m qd  Pt presents for follow up of hypertension.  The patient is tolerating the medication well without side effects. Compliance with treatment has been good; including taking medication as directed , maintains a healthy diet and regular exercise regimen , and following up as directed.  Pt currently on metoprolol 227mbid  Pt has a history of chronic diastolic CHF - she is trying to do a low sodium diet and currently on lasix 4056maily as needed - said she is trying to take this med on a regular basis  Pt with history of afib - is currently following with cardiology- she is currently on Eliquis 5mg56md (also has history of PE in 07/2019) she follows with Dr MunlBettina Gavia with history of GERD - doing well on omeprazole  Pt would like flu pneumonia shot today  Pt would like to schedule mammogram  Past Medical History:  Diagnosis Date   Angina pectoris (HCC)[place]28/2019   CAD in native artery    by CT coronary 10/2018 >> medical therapy    CHF (congestive heart failure), NYHA class I, chronic, diastolic (HCC)[id number] diastolic (congestive) heart failure (HCC)    Coronary artery disease of native artery of native heart with stable angina pectoris (HCC)[place]15/2021   COVID-19 virus infection    Encounter for vaccination 10/03/2020   Family history of coronary artery disease 01/30/2018   Gastro-esophageal reflux disease  without esophagitis 01/30/2018   GERD (gastroesophageal reflux disease)    Health maintenance examination 10/03/2020   History of pulmonary embolism 08/23/2019   Hyperlipidemia    Hypertension    Hypertensive heart disease with heart failure (HCC)[place]9/2019   Mixed hyperlipidemia 02/18/2018   New onset atrial fibrillation (HCC)[place] Other fatigue 02/10/2020   Other specified menopausal and perimenopausal disorders 02/10/2020   PAD (peripheral artery disease) (HCC)[place]6/2020   PAH (pulmonary artery hypertension) (HCC)[place]6/2020   Pre-diabetes    Prediabetes 02/10/2020   Pulmonary embolus (HCC)[place] Pulmonary hypertension (HCC)[place] Vitamin D deficiency 08/09/2019    Past Surgical History:  Procedure Laterality Date   ABDOMINAL HYSTERECTOMY     CATARACT EXTRACTION Left 03/2014   CATARACT EXTRACTION Right 05/2014   COLONOSCOPY  11/05/2013    Family History  Problem Relation Age of Onset   Heart disease Father     Social History   Socioeconomic History   Marital status: Widowed    Spouse name: Not on file   Number of children: 1   Years of education: Not on file   Highest education level: Not on file  Occupational History   Occupation: retired  Tobacco Use   Smoking status: Never   Smokeless tobacco: Never  Vaping Use   Vaping Use: Never used  Substance and Sexual Activity   Alcohol use: Never   Drug use: Never   Sexual activity: Not on file  Other Topics  Concern   Not on file  Social History Narrative   Not on file   Social Determinants of Health   Financial Resource Strain: Low Risk    Difficulty of Paying Living Expenses: Not hard at all  Food Insecurity: Not on file  Transportation Needs: Unknown   Lack of Transportation (Medical): No   Lack of Transportation (Non-Medical): Not on file  Physical Activity: Not on file  Stress: Not on file  Social Connections: Not on file  Intimate Partner Violence: Not on file     Current Outpatient Medications:    apixaban  (ELIQUIS) 5 MG TABS tablet, Take 1 tablet (5 mg total) by mouth 2 (two) times daily., Disp: 60 tablet, Rfl: 2   Ascorbic Acid (VITAMIN C) 100 MG tablet, Take 100 mg by mouth daily., Disp: , Rfl:    atorvastatin (LIPITOR) 20 MG tablet, TAKE 1 TABLET(20 MG) BY MOUTH EVERY DAY, Disp: 90 tablet, Rfl: 1   benzonatate (TESSALON) 100 MG capsule, Take 2 capsules (200 mg total) by mouth 3 (three) times daily as needed for cough., Disp: 20 capsule, Rfl: 0   calcium carbonate (OSCAL) 1500 (600 Ca) MG TABS tablet, Take 600 mg of elemental calcium by mouth daily., Disp: , Rfl:    Cholecalciferol (VITAMIN D3 PO), Take 2 capsules by mouth daily., Disp: , Rfl:    furosemide (LASIX) 40 MG tablet, TAKE 1 TABLET(40 MG) BY MOUTH DAILY AS NEEDED FOR SWELLING, Disp: 90 tablet, Rfl: 1   isosorbide mononitrate (IMDUR) 30 MG 24 hr tablet, TAKE 1 TABLET BY MOUTH EVERY DAY, Disp: 90 tablet, Rfl: 1   loratadine (CLARITIN) 10 MG tablet, Take 1 tablet (10 mg total) by mouth daily., Disp: 90 tablet, Rfl: 1   metoprolol tartrate (LOPRESSOR) 25 MG tablet, TAKE 1 TABLET(25 MG) BY MOUTH TWICE DAILY, Disp: 180 tablet, Rfl: 3   Multiple Vitamin (MULTIVITAMIN WITH MINERALS) TABS tablet, Take 1 tablet by mouth daily., Disp: , Rfl:    Multiple Vitamins-Minerals (OCUVITE PRESERVISION PO), Take 1 tablet by mouth in the morning and at bedtime., Disp: , Rfl:    omeprazole (PRILOSEC) 20 MG capsule, TAKE ONE CAPSULE BY MOUTH EVERY DAY, Disp: 180 capsule, Rfl: 1   OVER THE COUNTER MEDICATION, Take 1 tablet by mouth daily. Circulation vitamin from QVC -, Disp: , Rfl:    triamcinolone (NASACORT) 55 MCG/ACT AERO nasal inhaler, Place 1 spray into the nose daily as needed for allergies (allergies)., Disp: , Rfl:    vitamin E 200 UNIT capsule, Take 200 Units by mouth daily., Disp: , Rfl:    Allergies  Allergen Reactions   Clarithromycin     Numbness of extremities   Codeine Other (See Comments)    Caused pain   Penicillins Rash    Did it  involve swelling of the face/tongue/throat, SOB, or low BP? No Did it involve sudden or severe rash/hives, skin peeling, or any reaction on the inside of your mouth or nose? Yes Did you need to seek medical attention at a hospital or doctor's office? Yes When did it last happen?      20 years If all above answers are NO, may proceed with cephalosporin use.    CONSTITUTIONAL: Negative for chills, fatigue, fever, unintentional weight gain and unintentional weight loss.  E/N/T: Negative for ear pain, nasal congestion and sore throat.  CARDIOVASCULAR: Negative for chest pain, dizziness, palpitations and pedal edema.  RESPIRATORY: Negative for recent cough and dyspnea.  GASTROINTESTINAL: Negative for abdominal pain, acid reflux  symptoms, constipation, diarrhea, nausea and vomiting.  MSK: Negative for arthralgias and myalgias.  INTEGUMENTARY: Negative for rash.  NEUROLOGICAL: Negative for dizziness and headaches.  PSYCHIATRIC: Negative for sleep disturbance and to question depression screen.  Negative for depression, negative for anhedonia.             Objective:  PHYSICAL EXAM:   VS: BP 112/70 (BP Location: Left Arm, Patient Position: Sitting, Cuff Size: Large)    Pulse 82    Temp (!) 97.5 F (36.4 C) (Temporal)    Ht 5' 5"  (1.651 m)    Wt 242 lb (109.8 kg)    SpO2 95%    BMI 40.27 kg/m   GEN: Well nourished, well developed, in no acute distress  Cardiac: RRR; no murmurs, rubs, or gallops,no edema -  Respiratory:  normal respiratory rate and pattern with no distress - normal breath sounds with no rales, rhonchi, wheezes or rubs MS: no deformity or atrophy  Skin: warm and dry, no rash  Psych: euthymic mood, appropriate affect and demeanor  Health Maintenance Due  Topic Date Due   TETANUS/TDAP  Never done   Zoster Vaccines- Shingrix (2 of 2) 12/20/2020   MAMMOGRAM  02/24/2021    There are no preventive care reminders to display for this patient.  Lab Results  Component Value  Date   TSH 1.600 02/10/2020   Lab Results  Component Value Date   WBC 5.0 02/19/2021   HGB 12.9 02/19/2021   HCT 40.5 02/19/2021   MCV 85 02/19/2021   PLT 191 02/19/2021   Lab Results  Component Value Date   NA 141 02/19/2021   K 3.9 02/19/2021   CO2 24 02/19/2021   GLUCOSE 113 (H) 02/19/2021   BUN 20 02/19/2021   CREATININE 0.99 02/19/2021   BILITOT 0.8 02/19/2021   ALKPHOS 137 (H) 02/19/2021   AST 16 02/19/2021   ALT 12 02/19/2021   PROT 6.7 02/19/2021   ALBUMIN 3.9 02/19/2021   CALCIUM 9.7 02/19/2021   ANIONGAP 10 07/29/2019   EGFR 58 (L) 02/19/2021   Lab Results  Component Value Date   CHOL 149 02/19/2021   Lab Results  Component Value Date   HDL 53 02/19/2021   Lab Results  Component Value Date   LDLCALC 79 02/19/2021   Lab Results  Component Value Date   TRIG 90 02/19/2021   Lab Results  Component Value Date   CHOLHDL 2.8 02/19/2021   Lab Results  Component Value Date   HGBA1C 6.3 (H) 02/19/2021      Assessment & Plan:   Problem List Items Addressed This Visit       Cardiovascular and Mediastinum   Hypertensive heart disease with heart failure ([place])   Relevant Orders   CBC with Differential/Platelet   Comprehensive metabolic panel Continue current meds     Digestive   Gastro-esophageal reflux disease without esophagitis   Relevant Medications   omeprazole (PRILOSEC) 20 MG capsule Continue meds     Other   Mixed hyperlipidemia - Primary   Relevant Orders   Lipid panel Watch diet and continue meds   Vitamin D deficiency   Relevant Orders   VITAMIN D 25 Hydroxy (Vit-D Deficiency, Fractures)   Pre-diabetes   Relevant Orders   Hemoglobin A1c  Atrial fibrillation Continue current meds Follow up with cardiology as directed   Other Visit Diagnoses     Need for prophylactic vaccination and inoculation against pneumonia   Relevant Orders   Pneumo 23 given  Breast cancer screening Mammogram order given       No orders of  the defined types were placed in this encounter.    Follow-up: Return in about 6 months (around 02/18/2022) for chronic fasting follow up.    SARA R Wash Nienhaus, PA-C

## 2021-08-22 LAB — CBC WITH DIFFERENTIAL/PLATELET
Basophils Absolute: 0 10*3/uL (ref 0.0–0.2)
Basos: 1 %
EOS (ABSOLUTE): 0.2 10*3/uL (ref 0.0–0.4)
Eos: 4 %
Hematocrit: 38.1 % (ref 34.0–46.6)
Hemoglobin: 12.4 g/dL (ref 11.1–15.9)
Immature Grans (Abs): 0 10*3/uL (ref 0.0–0.1)
Immature Granulocytes: 0 %
Lymphocytes Absolute: 0.7 10*3/uL (ref 0.7–3.1)
Lymphs: 17 %
MCH: 26.9 pg (ref 26.6–33.0)
MCHC: 32.5 g/dL (ref 31.5–35.7)
MCV: 83 fL (ref 79–97)
Monocytes Absolute: 0.5 10*3/uL (ref 0.1–0.9)
Monocytes: 12 %
Neutrophils Absolute: 2.6 10*3/uL (ref 1.4–7.0)
Neutrophils: 66 %
Platelets: 182 10*3/uL (ref 150–450)
RBC: 4.61 x10E6/uL (ref 3.77–5.28)
RDW: 14.6 % (ref 11.7–15.4)
WBC: 4 10*3/uL (ref 3.4–10.8)

## 2021-08-22 LAB — LIPID PANEL
Chol/HDL Ratio: 3.1 ratio (ref 0.0–4.4)
Cholesterol, Total: 137 mg/dL (ref 100–199)
HDL: 44 mg/dL (ref >39)
LDL Chol Calc (NIH): 74 mg/dL (ref 0–99)
Triglycerides: 101 mg/dL (ref 0–149)
VLDL Cholesterol Cal: 19 mg/dL (ref 5–40)

## 2021-08-22 LAB — HEMOGLOBIN A1C
Est. average glucose Bld gHb Est-mCnc: 131 mg/dL
Hgb A1c MFr Bld: 6.2 % — ABNORMAL HIGH (ref 4.8–5.6)

## 2021-08-22 LAB — CARDIOVASCULAR RISK ASSESSMENT

## 2021-08-22 LAB — COMPREHENSIVE METABOLIC PANEL
ALT: 9 IU/L (ref 0–32)
AST: 16 IU/L (ref 0–40)
Albumin/Globulin Ratio: 1.4 (ref 1.2–2.2)
Albumin: 3.8 g/dL (ref 3.7–4.7)
Alkaline Phosphatase: 137 IU/L — ABNORMAL HIGH (ref 44–121)
BUN/Creatinine Ratio: 21 (ref 12–28)
BUN: 18 mg/dL (ref 8–27)
Bilirubin Total: 0.7 mg/dL (ref 0.0–1.2)
CO2: 23 mmol/L (ref 20–29)
Calcium: 9.3 mg/dL (ref 8.7–10.3)
Chloride: 104 mmol/L (ref 96–106)
Creatinine, Ser: 0.85 mg/dL (ref 0.57–1.00)
Globulin, Total: 2.7 g/dL (ref 1.5–4.5)
Glucose: 114 mg/dL — ABNORMAL HIGH (ref 70–99)
Potassium: 3.8 mmol/L (ref 3.5–5.2)
Sodium: 140 mmol/L (ref 134–144)
Total Protein: 6.5 g/dL (ref 6.0–8.5)
eGFR: 70 mL/min/{1.73_m2} (ref >59)

## 2021-08-22 LAB — VITAMIN D 25 HYDROXY (VIT D DEFICIENCY, FRACTURES): Vit D, 25-Hydroxy: 58.5 ng/mL (ref 30.0–100.0)

## 2021-08-23 ENCOUNTER — Telehealth: Payer: Self-pay

## 2021-08-23 ENCOUNTER — Other Ambulatory Visit: Payer: Self-pay

## 2021-08-23 ENCOUNTER — Other Ambulatory Visit: Payer: Self-pay | Admitting: Physician Assistant

## 2021-08-23 MED ORDER — APIXABAN 5 MG PO TABS: 5.0000 mg | ORAL_TABLET | Freq: Two times a day (BID) | ORAL | 2 refills | Status: DC

## 2021-08-23 MED ORDER — FUROSEMIDE 40 MG PO TABS: ORAL_TABLET | ORAL | 1 refills | Status: DC

## 2021-08-23 NOTE — Chronic Care Management (AMB) (Signed)
? ? ?  Chronic Care Management ?Pharmacy Assistant  ? ?Name: Miranda Moses  MRN: [id number] DOB: 06-Jun-1941 ? ?Reason for Encounter: Patient Assistance Coordination  ?  ?08/23/2021- Received fax from [facility] requesting out of pocket prescription expense report in order to approve patients Eliquis for assistance. Called patient, she is aware, and informed me that she will have to pay for Eliquis out of pocket first before she will be able to get assistance help. Patient requested a prescription of Eliquis and Furosemide to be sent to [facility]. Request sent to Clinical team. Patient will bring out of pocket expense report to PCP office once she receives.  ? ?Medications: ?Outpatient Encounter Medications as of 08/23/2021  ?Medication Sig  ? apixaban (ELIQUIS) 5 MG TABS tablet Take 1 tablet (5 mg total) by mouth 2 (two) times daily.  ? Ascorbic Acid (VITAMIN C) 100 MG tablet Take 100 mg by mouth daily.  ? atorvastatin (LIPITOR) 20 MG tablet TAKE 1 TABLET(20 MG) BY MOUTH EVERY DAY  ? benzonatate (TESSALON) 100 MG capsule Take 2 capsules (200 mg total) by mouth 3 (three) times daily as needed for cough.  ? calcium carbonate (OSCAL) 1500 (600 Ca) MG TABS tablet Take 600 mg of elemental calcium by mouth daily.  ? Cholecalciferol (VITAMIN D3 PO) Take 2 capsules by mouth daily.  ? furosemide (LASIX) 40 MG tablet TAKE 1 TABLET(40 MG) BY MOUTH DAILY AS NEEDED FOR SWELLING  ? isosorbide mononitrate (IMDUR) 30 MG 24 hr tablet TAKE 1 TABLET BY MOUTH EVERY DAY  ? loratadine (CLARITIN) 10 MG tablet Take 1 tablet (10 mg total) by mouth daily.  ? metoprolol tartrate (LOPRESSOR) 25 MG tablet TAKE 1 TABLET(25 MG) BY MOUTH TWICE DAILY  ? Multiple Vitamin (MULTIVITAMIN WITH MINERALS) TABS tablet Take 1 tablet by mouth daily.  ? Multiple Vitamins-Minerals (OCUVITE PRESERVISION PO) Take 1 tablet by mouth in the morning and at bedtime.  ? omeprazole (PRILOSEC) 20 MG capsule TAKE ONE CAPSULE BY MOUTH EVERY DAY  ? OVER THE COUNTER  MEDICATION Take 1 tablet by mouth daily. Circulation vitamin from QVC -  ? triamcinolone (NASACORT) 55 MCG/ACT AERO nasal inhaler Place 1 spray into the nose daily as needed for allergies (allergies).  ? vitamin E 200 UNIT capsule Take 200 Units by mouth daily.  ? ?No facility-administered encounter medications on file as of 08/23/2021.  ? ? ?Pattricia Boss, CMA ?Clinical Pharmacist Assistant ?[phone] ? ?

## 2021-09-24 ENCOUNTER — Other Ambulatory Visit: Payer: Self-pay | Admitting: Physician Assistant

## 2021-10-03 ENCOUNTER — Telehealth: Payer: Self-pay

## 2021-10-03 NOTE — Progress Notes (Signed)
? ? ?  Chronic Care Management ?Pharmacy Assistant  ? ?Name: Miranda Moses  MRN: [id number] DOB: May 04, 1941 ? ?Reason for Encounter: General Adherence Call  ? ?Recent office visits:  ?08/21/21 [name] PA-C. Seen for HLD. No med changes. ? ?Recent consult visits:  ?None ? ?Hospital visits:  ?None ? ?Medications: ?Outpatient Encounter Medications as of 10/03/2021  ?Medication Sig  ? apixaban (ELIQUIS) 5 MG TABS tablet Take 1 tablet (5 mg total) by mouth 2 (two) times daily.  ? Ascorbic Acid (VITAMIN C) 100 MG tablet Take 100 mg by mouth daily.  ? atorvastatin (LIPITOR) 20 MG tablet TAKE 1 TABLET(20 MG) BY MOUTH EVERY DAY  ? benzonatate (TESSALON) 100 MG capsule Take 2 capsules (200 mg total) by mouth 3 (three) times daily as needed for cough.  ? calcium carbonate (OSCAL) 1500 (600 Ca) MG TABS tablet Take 600 mg of elemental calcium by mouth daily.  ? Cholecalciferol (VITAMIN D3 PO) Take 2 capsules by mouth daily.  ? furosemide (LASIX) 40 MG tablet TAKE 1 TABLET(40 MG) BY MOUTH DAILY AS NEEDED FOR SWELLING  ? isosorbide mononitrate (IMDUR) 30 MG 24 hr tablet TAKE 1 TABLET BY MOUTH EVERY DAY  ? loratadine (CLARITIN) 10 MG tablet Take 1 tablet (10 mg total) by mouth daily.  ? metoprolol tartrate (LOPRESSOR) 25 MG tablet TAKE 1 TABLET(25 MG) BY MOUTH TWICE DAILY  ? Multiple Vitamin (MULTIVITAMIN WITH MINERALS) TABS tablet Take 1 tablet by mouth daily.  ? Multiple Vitamins-Minerals (OCUVITE PRESERVISION PO) Take 1 tablet by mouth in the morning and at bedtime.  ? omeprazole (PRILOSEC) 20 MG capsule TAKE ONE CAPSULE BY MOUTH EVERY DAY  ? OVER THE COUNTER MEDICATION Take 1 tablet by mouth daily. Circulation vitamin from QVC -  ? triamcinolone (NASACORT) 55 MCG/ACT AERO nasal inhaler Place 1 spray into the nose daily as needed for allergies (allergies).  ? vitamin E 200 UNIT capsule Take 200 Units by mouth daily.  ? ?No facility-administered encounter medications on file as of 10/03/2021.  ? ? ?[place] for general  disease state and medication adherence call.  ? ?Patient is not > 5 days past due for refill on the following medications per chart history: ? ?[facility] Medications: ?Medication Name/mg Last Fill Days Supply ?Atorvastatin '20mg'$    09/24/21  90ds ? ?Care Gaps: ?Last annual wellness visit?10/03/20 ?Mammogram: 02/25/20 ?Dexa Scan: 02/25/20 ? ?Unable to reach pt after several attempts  ? ?[name], CMA ?Clinical Pharmacist Assistant  ?[phone]  ?

## 2021-12-05 ENCOUNTER — Telehealth: Payer: Self-pay

## 2021-12-05 NOTE — Progress Notes (Signed)
Chronic Care Management Pharmacy Assistant   Name: Miranda Moses  MRN: [id number] DOB: 02/27/1941   Reason for Encounter: General Adherence Call    Recent office visits:  None  Recent consult visits:  None  Hospital visits:  None  Medications: Outpatient Encounter Medications as of 12/05/2021  Medication Sig   apixaban (ELIQUIS) 5 MG TABS tablet Take 1 tablet (5 mg total) by mouth 2 (two) times daily.   Ascorbic Acid (VITAMIN C) 100 MG tablet Take 100 mg by mouth daily.   atorvastatin (LIPITOR) 20 MG tablet TAKE 1 TABLET(20 MG) BY MOUTH EVERY DAY   benzonatate (TESSALON) 100 MG capsule Take 2 capsules (200 mg total) by mouth 3 (three) times daily as needed for cough.   calcium carbonate (OSCAL) 1500 (600 Ca) MG TABS tablet Take 600 mg of elemental calcium by mouth daily.   Cholecalciferol (VITAMIN D3 PO) Take 2 capsules by mouth daily.   furosemide (LASIX) 40 MG tablet TAKE 1 TABLET(40 MG) BY MOUTH DAILY AS NEEDED FOR SWELLING   isosorbide mononitrate (IMDUR) 30 MG 24 hr tablet TAKE 1 TABLET BY MOUTH EVERY DAY   loratadine (CLARITIN) 10 MG tablet Take 1 tablet (10 mg total) by mouth daily.   metoprolol tartrate (LOPRESSOR) 25 MG tablet TAKE 1 TABLET(25 MG) BY MOUTH TWICE DAILY   Multiple Vitamin (MULTIVITAMIN WITH MINERALS) TABS tablet Take 1 tablet by mouth daily.   Multiple Vitamins-Minerals (OCUVITE PRESERVISION PO) Take 1 tablet by mouth in the morning and at bedtime.   omeprazole (PRILOSEC) 20 MG capsule TAKE ONE CAPSULE BY MOUTH EVERY DAY   OVER THE COUNTER MEDICATION Take 1 tablet by mouth daily. Circulation vitamin from QVC -   triamcinolone (NASACORT) 55 MCG/ACT AERO nasal inhaler Place 1 spray into the nose daily as needed for allergies (allergies).   vitamin E 200 UNIT capsule Take 200 Units by mouth daily.   No facility-administered encounter medications on file as of 12/05/2021.    [name] [facility] [name] Review Call   Chart Review:  Have there been  any documented new, changed, or discontinued medications since last visit? No (If yes, include name, dose, frequency, date) Has there been any documented recent hospitalizations or ED visits since last visit with Clinical Pharmacist? No Brief Summary (including medication and/or Diagnosis changes):   Adherence Review:  Does the Clinical Pharmacist Assistant have access to adherence rates? Yes Adherence rates for STAR metric medications (List medication(s)/day supply/ last 2 fill dates).Atorvastatin 09/24/21-06/30/21 90ds Adherence rates for medications indicated for disease state being reviewed (List medication(s)/day supply/ last 2 fill dates). Does the patient have >5 day gap between last estimated fill dates for any of the above medications or other medication gaps? No Reason for medication gaps.   Disease State Questions:  Able to connect with Patient? Yes Did patient have any problems with their health recently? No Note problems and Concerns: Have you had any admissions or emergency room visits or worsening of your condition(s) since last visit? No Details of ED visit, hospital visit and/or worsening condition(s): Have you had any visits with new specialists or providers since your last visit? No Explain: Have you had any new health care problem(s) since your last visit? No New problem(s) reported: Have you run out of any of your medications since you last spoke with clinical pharmacist? No What caused you to run out of your medications? Are there any medications you are not taking as prescribed? No What kept you from taking your  medications as prescribed? Are you having any issues or side effects with your medications? No Note of issues or side effects: Do you have any other health concerns or questions you want to discuss with your Clinical Pharmacist before your next visit? No Note additional concerns and questions from Patient. Are there any health concerns that you feel we can  do a better job addressing? No Note Patient's response. Are you having any problems with any of the following since the last visit: (select all that apply)  None  Details: 12. Any falls since last visit? No  Details: 13. Any increased or uncontrolled pain since last visit? No  Details: 14. Next visit Type: office       Visit with:Sara Rosana Hoes        Date:02/18/22        Time:10:20am  15. Additional Details? No    [name], [facility] Catering manager  [phone]

## 2021-12-22 ENCOUNTER — Other Ambulatory Visit: Payer: Self-pay | Admitting: Cardiology

## 2022-01-11 DIAGNOSIS — H43813 Vitreous degeneration, bilateral: Secondary | ICD-10-CM | POA: Diagnosis not present

## 2022-01-11 DIAGNOSIS — H353121 Nonexudative age-related macular degeneration, left eye, early dry stage: Secondary | ICD-10-CM | POA: Diagnosis not present

## 2022-01-11 DIAGNOSIS — H353212 Exudative age-related macular degeneration, right eye, with inactive choroidal neovascularization: Secondary | ICD-10-CM | POA: Diagnosis not present

## 2022-02-18 ENCOUNTER — Ambulatory Visit (INDEPENDENT_AMBULATORY_CARE_PROVIDER_SITE_OTHER): Payer: Medicare Other | Admitting: Physician Assistant

## 2022-02-18 ENCOUNTER — Encounter: Payer: Self-pay | Admitting: Physician Assistant

## 2022-02-18 VITALS — BP 112/64 | HR 93 | Temp 97.5°F | Resp 18 | Ht 65.0 in | Wt 244.2 lb

## 2022-02-18 DIAGNOSIS — E559 Vitamin D deficiency, unspecified: Secondary | ICD-10-CM

## 2022-02-18 DIAGNOSIS — K219 Gastro-esophageal reflux disease without esophagitis: Secondary | ICD-10-CM

## 2022-02-18 DIAGNOSIS — I272 Pulmonary hypertension, unspecified: Secondary | ICD-10-CM

## 2022-02-18 DIAGNOSIS — I110 Hypertensive heart disease with heart failure: Secondary | ICD-10-CM

## 2022-02-18 DIAGNOSIS — R7303 Prediabetes: Secondary | ICD-10-CM | POA: Diagnosis not present

## 2022-02-18 DIAGNOSIS — E782 Mixed hyperlipidemia: Secondary | ICD-10-CM

## 2022-02-18 DIAGNOSIS — I739 Peripheral vascular disease, unspecified: Secondary | ICD-10-CM

## 2022-02-18 MED ORDER — OMEPRAZOLE 20 MG PO CPDR: 20.0000 mg | DELAYED_RELEASE_CAPSULE | Freq: Every day | ORAL | 1 refills | Status: DC

## 2022-02-18 NOTE — Progress Notes (Signed)
Established Patient Office Visit  Subjective:  Patient ID: Miranda Moses, female    DOB: 14-May-1941  Age: 81 y.o. MRN: [id number]  CC:  Chief Complaint  Patient presents with   Hyperlipidemia         HPI [name] presents for chronic follow up hyperlipidemia  Mixed hyperlipidemia  Pt presents with hyperlipidemia.  Compliance with treatment has been good; The patient is compliant with medications, maintains a low cholesterol diet , follows up as directed ,  . The patient denies experiencing any hypercholesterolemia related symptoms. . Pt currently on atorvastatin 3m qd  Pt presents for follow up of hypertension.  The patient is tolerating the medication well without side effects. Compliance with treatment has been good; including taking medication as directed , maintains a healthy diet and regular exercise regimen , and following up as directed.  Pt currently on metoprolol 2105mbid and also takes lasix 4018mrn  Pt has a history of chronic diastolic CHF and CAD- she is trying to do a low sodium diet and currently on lasix 29m61mily as needed - said she is trying to take this med on a regular basis She is also on imdur 30mg81mPt with history of afib - is currently following with cardiology- she is currently on Eliquis 5mg b60m(also has history of PE in 07/2019) she follows with Dr MunleyBettina Gaviaas a follow up appt with him in October  Pt with history of GERD - doing well on omeprazole and requests refill of medication  Pt has mammogram scheduled for tomorrow  Past Medical History:  Diagnosis Date   Angina pectoris (HCC) [place]/2019   CAD in native artery    by CT coronary 10/2018 >> medical therapy    CHF (congestive heart failure), NYHA class I, chronic, diastolic (HCC) 2[place]/22/70/7867onic diastolic (congestive) heart failure (HCC)    Coronary artery disease of native artery of native heart with stable angina pectoris (HCC) 2[place]/2021   COVID-19 virus infection    Encounter for  vaccination 10/03/2020   Family history of coronary artery disease 01/30/2018   Gastro-esophageal reflux disease without esophagitis 01/30/2018   GERD (gastroesophageal reflux disease)    Health maintenance examination 10/03/2020   History of pulmonary embolism 08/23/2019   Hyperlipidemia    Hypertension    Hypertensive heart disease with heart failure (HCC) 8[place]2019   Mixed hyperlipidemia 02/18/2018   New onset atrial fibrillation (HCC)  [place] fatigue 02/10/2020   Other specified menopausal and perimenopausal disorders 02/10/2020   PAD (peripheral artery disease) (HCC) 3[place]2020   PAH (pulmonary artery hypertension) (HCC) 3[place]2020   Pre-diabetes    Prediabetes 02/10/2020   Pulmonary embolus (HCC)  [place]  [place] D deficiency 08/09/2019    Past Surgical History:  Procedure Laterality Date   ABDOMINAL HYSTERECTOMY     CATARACT EXTRACTION Left 03/2014   CATARACT EXTRACTION Right 05/2014   COLONOSCOPY  11/05/2013    Family History  Problem Relation Age of Onset   Heart disease Father     Social History   Socioeconomic History   Marital status: Widowed    Spouse name: Not on file   Number of children: 1   Years of education: Not on file   Highest education level: Not on file  Occupational History   Occupation: retired  Tobacco Use   Smoking status: Never   Smokeless tobacco: Never  Vaping Use   Vaping Use: Never used  Substance and Sexual Activity   Alcohol use: Never   Drug use: Never   Sexual activity: Not on file  Other Topics Concern   Not on file  Social History Narrative   Not on file   Social Determinants of Health   Financial Resource Strain: Low Risk  (08/14/2021)   Overall Financial Resource Strain (CARDIA)    Difficulty of Paying Living Expenses: Not hard at all  Food Insecurity: No Food Insecurity (06/06/2020)   Hunger Vital Sign    Worried About Running Out of Food in the Last Year: Never true    Ran Out of Food in the Last  Year: Never true  Transportation Needs: Unknown (08/14/2021)   PRAPARE - Hydrologist (Medical): No    Lack of Transportation (Non-Medical): Not on file  Physical Activity: Not on file  Stress: Not on file  Social Connections: Not on file  Intimate Partner Violence: Not on file     Current Outpatient Medications:    apixaban (ELIQUIS) 5 MG TABS tablet, Take 1 tablet (5 mg total) by mouth 2 (two) times daily., Disp: 60 tablet, Rfl: 2   Ascorbic Acid (VITAMIN C) 100 MG tablet, Take 100 mg by mouth daily., Disp: , Rfl:    atorvastatin (LIPITOR) 20 MG tablet, TAKE 1 TABLET(20 MG) BY MOUTH EVERY DAY, Disp: 90 tablet, Rfl: 1   benzonatate (TESSALON) 100 MG capsule, Take 2 capsules (200 mg total) by mouth 3 (three) times daily as needed for cough., Disp: 20 capsule, Rfl: 0   calcium carbonate (OSCAL) 1500 (600 Ca) MG TABS tablet, Take 600 mg of elemental calcium by mouth daily., Disp: , Rfl:    Cholecalciferol (VITAMIN D3 PO), Take 2 capsules by mouth daily., Disp: , Rfl:    furosemide (LASIX) 40 MG tablet, TAKE 1 TABLET(40 MG) BY MOUTH DAILY AS NEEDED FOR SWELLING, Disp: 90 tablet, Rfl: 1   isosorbide mononitrate (IMDUR) 30 MG 24 hr tablet, TAKE 1 TABLET BY MOUTH EVERY DAY, Disp: 90 tablet, Rfl: 1   loratadine (CLARITIN) 10 MG tablet, Take 1 tablet (10 mg total) by mouth daily., Disp: 90 tablet, Rfl: 1   metoprolol tartrate (LOPRESSOR) 25 MG tablet, TAKE 1 TABLET(25 MG) BY MOUTH TWICE DAILY, Disp: 180 tablet, Rfl: 3   Multiple Vitamin (MULTIVITAMIN WITH MINERALS) TABS tablet, Take 1 tablet by mouth daily., Disp: , Rfl:    Multiple Vitamins-Minerals (OCUVITE PRESERVISION PO), Take 1 tablet by mouth in the morning and at bedtime., Disp: , Rfl:    OVER THE COUNTER MEDICATION, Take 1 tablet by mouth daily. Circulation vitamin from QVC -, Disp: , Rfl:    triamcinolone (NASACORT) 55 MCG/ACT AERO nasal inhaler, Place 1 spray into the nose daily as needed for allergies  (allergies)., Disp: , Rfl:    vitamin E 200 UNIT capsule, Take 200 Units by mouth daily., Disp: , Rfl:    omeprazole (PRILOSEC) 20 MG capsule, Take 1 capsule (20 mg total) by mouth daily., Disp: 90 capsule, Rfl: 1   Allergies  Allergen Reactions   Clarithromycin     Numbness of extremities   Codeine Other (See Comments)    Caused pain   Penicillins Rash    Did it involve swelling of the face/tongue/throat, SOB, or low BP? No Did it involve sudden or severe rash/hives, skin peeling, or any reaction on the inside of your mouth or nose? Yes Did you need to seek medical attention at a hospital or doctor's office? Yes  When did it last happen?      20 years If all above answers are "NO", may proceed with cephalosporin use.    CONSTITUTIONAL: Negative for chills, fatigue, fever, unintentional weight gain and unintentional weight loss.  E/N/T: Negative for ear pain, nasal congestion and sore throat.  CARDIOVASCULAR: Negative for chest pain, dizziness, palpitations and pedal edema.  RESPIRATORY: Negative for recent cough and dyspnea.  GASTROINTESTINAL: Negative for abdominal pain, acid reflux symptoms, constipation, diarrhea, nausea and vomiting.  MSK: Negative for arthralgias and myalgias.  INTEGUMENTARY: Negative for rash.  NEUROLOGICAL: Negative for dizziness and headaches.  PSYCHIATRIC: Negative for sleep disturbance and to question depression screen.  Negative for depression, negative for anhedonia.         Objective:  PHYSICAL EXAM:   VS: BP 112/64   Pulse 93   Temp (!) 97.5 F (36.4 C)   Resp 18   Ht 5' 5"  (1.651 m)   Wt 244 lb 3.2 oz (110.8 kg)   SpO2 96%   BMI 40.64 kg/m   GEN: Well nourished, well developed, in no acute distress  Cardiac: RRR; no murmurs, rubs, or gallops,no edema -  Respiratory:  normal respiratory rate and pattern with no distress - normal breath sounds with no rales, rhonchi, wheezes or rubs GI: normal bowel sounds, no masses or tenderness MS: no  deformity or atrophy  Skin: warm and dry, no rash  Neuro:  Alert and Oriented x 3, - CN II-Xii grossly intact Psych: euthymic mood, appropriate affect and demeanor  Health Maintenance Due  Topic Date Due   TETANUS/TDAP  Never done   MAMMOGRAM  02/24/2021   INFLUENZA VACCINE  01/22/2022    There are no preventive care reminders to display for this patient.  Lab Results  Component Value Date   TSH 1.600 02/10/2020   Lab Results  Component Value Date   WBC 4.0 08/21/2021   HGB 12.4 08/21/2021   HCT 38.1 08/21/2021   MCV 83 08/21/2021   PLT 182 08/21/2021   Lab Results  Component Value Date   NA 140 08/21/2021   K 3.8 08/21/2021   CO2 23 08/21/2021   GLUCOSE 114 (H) 08/21/2021   BUN 18 08/21/2021   CREATININE 0.85 08/21/2021   BILITOT 0.7 08/21/2021   ALKPHOS 137 (H) 08/21/2021   AST 16 08/21/2021   ALT 9 08/21/2021   PROT 6.5 08/21/2021   ALBUMIN 3.8 08/21/2021   CALCIUM 9.3 08/21/2021   ANIONGAP 10 07/29/2019   EGFR 70 08/21/2021   Lab Results  Component Value Date   CHOL 137 08/21/2021   Lab Results  Component Value Date   HDL 44 08/21/2021   Lab Results  Component Value Date   LDLCALC 74 08/21/2021   Lab Results  Component Value Date   TRIG 101 08/21/2021   Lab Results  Component Value Date   CHOLHDL 3.1 08/21/2021   Lab Results  Component Value Date   HGBA1C 6.2 (H) 08/21/2021      Assessment & Plan:   Problem List Items Addressed This Visit       Cardiovascular and Mediastinum   Hypertensive heart disease with heart failure ([place])   Relevant Orders   CBC with Differential/Platelet   Comprehensive metabolic panel Continue current meds Follow up with cardiology as directed     Digestive   Gastro-esophageal reflux disease without esophagitis   Relevant Medications   omeprazole (PRILOSEC) 20 MG capsule Continue meds     Other   Mixed  hyperlipidemia - Primary   Relevant Orders   Lipid panel Watch diet and continue meds    Vitamin D deficiency   Relevant Orders   VITAMIN D 25 Hydroxy (Vit-D Deficiency, Fractures)   Pre-diabetes   Relevant Orders   Hemoglobin A1c  Atrial fibrillation Continue current meds Follow up with cardiology as directed   Other Visit Diagnoses         Meds ordered this encounter  Medications   omeprazole (PRILOSEC) 20 MG capsule    Sig: Take 1 capsule (20 mg total) by mouth daily.    Dispense:  90 capsule    Refill:  1    Order Specific Question:   Supervising Provider    Answer:   S[place]ton Silvas     Follow-up: Return in about 6 months (around 08/21/2022) for chronic fasting follow up.    SARA R Chisa Kushner, PA-C

## 2022-02-19 DIAGNOSIS — Z1231 Encounter for screening mammogram for malignant neoplasm of breast: Secondary | ICD-10-CM | POA: Diagnosis not present

## 2022-02-19 DIAGNOSIS — R928 Other abnormal and inconclusive findings on diagnostic imaging of breast: Secondary | ICD-10-CM | POA: Diagnosis not present

## 2022-02-19 LAB — CBC WITH DIFFERENTIAL/PLATELET
Basophils Absolute: 0 10*3/uL (ref 0.0–0.2)
Basos: 1 %
EOS (ABSOLUTE): 0.1 10*3/uL (ref 0.0–0.4)
Eos: 3 %
Hematocrit: 40.4 % (ref 34.0–46.6)
Hemoglobin: 13 g/dL (ref 11.1–15.9)
Immature Grans (Abs): 0 10*3/uL (ref 0.0–0.1)
Immature Granulocytes: 0 %
Lymphocytes Absolute: 0.8 10*3/uL (ref 0.7–3.1)
Lymphs: 22 %
MCH: 27.7 pg (ref 26.6–33.0)
MCHC: 32.2 g/dL (ref 31.5–35.7)
MCV: 86 fL (ref 79–97)
Monocytes Absolute: 0.4 10*3/uL (ref 0.1–0.9)
Monocytes: 10 %
Neutrophils Absolute: 2.2 10*3/uL (ref 1.4–7.0)
Neutrophils: 64 %
Platelets: 175 10*3/uL (ref 150–450)
RBC: 4.7 x10E6/uL (ref 3.77–5.28)
RDW: 14.6 % (ref 11.7–15.4)
WBC: 3.5 10*3/uL (ref 3.4–10.8)

## 2022-02-19 LAB — LIPID PANEL
Chol/HDL Ratio: 2.8 ratio (ref 0.0–4.4)
Cholesterol, Total: 157 mg/dL (ref 100–199)
HDL: 56 mg/dL (ref >39)
LDL Chol Calc (NIH): 81 mg/dL (ref 0–99)
Triglycerides: 110 mg/dL (ref 0–149)
VLDL Cholesterol Cal: 20 mg/dL (ref 5–40)

## 2022-02-19 LAB — COMPREHENSIVE METABOLIC PANEL
ALT: 10 IU/L (ref 0–32)
AST: 17 IU/L (ref 0–40)
Albumin/Globulin Ratio: 1.6 (ref 1.2–2.2)
Albumin: 4.1 g/dL (ref 3.8–4.8)
Alkaline Phosphatase: 132 IU/L — ABNORMAL HIGH (ref 44–121)
BUN/Creatinine Ratio: 24 (ref 12–28)
BUN: 21 mg/dL (ref 8–27)
Bilirubin Total: 1 mg/dL (ref 0.0–1.2)
CO2: 22 mmol/L (ref 20–29)
Calcium: 9.5 mg/dL (ref 8.7–10.3)
Chloride: 103 mmol/L (ref 96–106)
Creatinine, Ser: 0.86 mg/dL (ref 0.57–1.00)
Globulin, Total: 2.6 g/dL (ref 1.5–4.5)
Glucose: 107 mg/dL — ABNORMAL HIGH (ref 70–99)
Potassium: 3.9 mmol/L (ref 3.5–5.2)
Sodium: 140 mmol/L (ref 134–144)
Total Protein: 6.7 g/dL (ref 6.0–8.5)
eGFR: 69 mL/min/{1.73_m2} (ref >59)

## 2022-02-19 LAB — HEMOGLOBIN A1C
Est. average glucose Bld gHb Est-mCnc: 131 mg/dL
Hgb A1c MFr Bld: 6.2 % — ABNORMAL HIGH (ref 4.8–5.6)

## 2022-02-19 LAB — TSH: TSH: 1.41 u[IU]/mL (ref 0.450–4.500)

## 2022-02-19 LAB — CARDIOVASCULAR RISK ASSESSMENT

## 2022-02-19 LAB — VITAMIN D 25 HYDROXY (VIT D DEFICIENCY, FRACTURES): Vit D, 25-Hydroxy: 45.4 ng/mL (ref 30.0–100.0)

## 2022-02-20 ENCOUNTER — Ambulatory Visit: Payer: Medicare Other

## 2022-02-20 VITALS — BP 112/70 | HR 77 | Temp 97.5°F

## 2022-02-20 DIAGNOSIS — Z0000 Encounter for general adult medical examination without abnormal findings: Secondary | ICD-10-CM

## 2022-02-20 NOTE — Progress Notes (Signed)
Subjective:   Miranda Moses is a 81 y.o. female who presents for Medicare Annual (Subsequent) preventive examination.  Review of Systems     Cardiac Risk Factors include: advanced age (>36mn, >>36women);hypertension     Objective:    Today's Vitals   02/20/22 1459  BP: 112/70  Pulse: 77  Temp: (!) 97.5 F (36.4 C)  TempSrc: Temporal  SpO2: 98%   There is no height or weight on file to calculate BMI.     10/03/2020   10:05 AM 07/23/2019    2:00 PM  Advanced Directives  Does Patient Have a Medical Advance Directive? No No  Would patient like information on creating a medical advance directive? No - Patient declined No - Patient declined    Current Medications (verified) Outpatient Encounter Medications as of 02/20/2022  Medication Sig   apixaban (ELIQUIS) 5 MG TABS tablet Take 1 tablet (5 mg total) by mouth 2 (two) times daily.   Ascorbic Acid (VITAMIN C) 100 MG tablet Take 100 mg by mouth daily.   atorvastatin (LIPITOR) 20 MG tablet TAKE 1 TABLET(20 MG) BY MOUTH EVERY DAY   benzonatate (TESSALON) 100 MG capsule Take 2 capsules (200 mg total) by mouth 3 (three) times daily as needed for cough.   calcium carbonate (OSCAL) 1500 (600 Ca) MG TABS tablet Take 600 mg of elemental calcium by mouth daily.   Cholecalciferol (VITAMIN D3 PO) Take 2 capsules by mouth daily.   furosemide (LASIX) 40 MG tablet TAKE 1 TABLET(40 MG) BY MOUTH DAILY AS NEEDED FOR SWELLING   isosorbide mononitrate (IMDUR) 30 MG 24 hr tablet TAKE 1 TABLET BY MOUTH EVERY DAY   loratadine (CLARITIN) 10 MG tablet Take 1 tablet (10 mg total) by mouth daily.   metoprolol tartrate (LOPRESSOR) 25 MG tablet TAKE 1 TABLET(25 MG) BY MOUTH TWICE DAILY   Multiple Vitamin (MULTIVITAMIN WITH MINERALS) TABS tablet Take 1 tablet by mouth daily.   Multiple Vitamins-Minerals (OCUVITE PRESERVISION PO) Take 1 tablet by mouth in the morning and at bedtime.   omeprazole (PRILOSEC) 20 MG capsule Take 1 capsule (20 mg total) by mouth  daily.   OVER THE COUNTER MEDICATION Take 1 tablet by mouth daily. Circulation vitamin from QVC -   triamcinolone (NASACORT) 55 MCG/ACT AERO nasal inhaler Place 1 spray into the nose daily as needed for allergies (allergies).   vitamin E 200 UNIT capsule Take 200 Units by mouth daily.   No facility-administered encounter medications on file as of 02/20/2022.    Allergies (verified) Clarithromycin, Codeine, and Penicillins   History: Past Medical History:  Diagnosis Date   Angina pectoris ([facility] 02/18/2018   CAD in native artery    by CT coronary 10/2018 >> medical therapy    CHF (congestive heart failure), NYHA class I, chronic, diastolic (HCC) 20/17/5102  Chronic diastolic (congestive) heart failure (HCC)    Coronary artery disease of native artery of native heart with stable angina pectoris ([place] 08/09/2019   COVID-19 virus infection    Encounter for vaccination 10/03/2020   Family history of coronary artery disease 01/30/2018   Gastro-esophageal reflux disease without esophagitis 01/30/2018   GERD (gastroesophageal reflux disease)    Health maintenance examination 10/03/2020   History of pulmonary embolism 08/23/2019   Hyperlipidemia    Hypertension    Hypertensive heart disease with heart failure ([place] 01/30/2018   Mixed hyperlipidemia 02/18/2018   New onset atrial fibrillation ([facility]    Other fatigue 02/10/2020   Other specified menopausal and perimenopausal  disorders 02/10/2020   PAD (peripheral artery disease) ([place]) 08/28/2018   PAH (pulmonary artery hypertension) ([place]) 08/28/2018   Pre-diabetes    Prediabetes 02/10/2020   Pulmonary embolus (HCC)    Pulmonary hypertension (HCC)    Vitamin D deficiency 08/09/2019   Past Surgical History:  Procedure Laterality Date   ABDOMINAL HYSTERECTOMY     CATARACT EXTRACTION Left 03/2014   CATARACT EXTRACTION Right 05/2014   COLONOSCOPY  11/05/2013   Family History  Problem Relation Age of Onset   Heart disease Father    Social History    Socioeconomic History   Marital status: Widowed    Spouse name: Not on file   Number of children: 1   Years of education: Not on file   Highest education level: Not on file  Occupational History   Occupation: retired  Tobacco Use   Smoking status: Never   Smokeless tobacco: Never  Vaping Use   Vaping Use: Never used  Substance and Sexual Activity   Alcohol use: Never   Drug use: Never   Sexual activity: Not on file  Other Topics Concern   Not on file  Social History Narrative   Not on file   Social Determinants of Health   Financial Resource Strain: Low Risk  (02/20/2022)   Overall Financial Resource Strain (CARDIA)    Difficulty of Paying Living Expenses: Not very hard  Food Insecurity: No Food Insecurity (02/20/2022)   Hunger Vital Sign    Worried About Running Out of Food in the Last Year: Never true    Ran Out of Food in the Last Year: Never true  Transportation Needs: No Transportation Needs (02/20/2022)   PRAPARE - Hydrologist (Medical): No    Lack of Transportation (Non-Medical): No  Physical Activity: Inactive (02/20/2022)   Exercise Vital Sign    Days of Exercise per Week: 0 days    Minutes of Exercise per Session: 0 min  Stress: No Stress Concern Present (02/20/2022)   [place]    Feeling of Stress : Only a little  Social Connections: Moderately Isolated (02/20/2022)   Social Connection and Isolation Panel [NHANES]    Frequency of Communication with Friends and Family: More than three times a week    Frequency of Social Gatherings with Friends and Family: More than three times a week    Attends Religious Services: 1 to 4 times per year    Active Member of [employer] or Organizations: No    Attends Archivist Meetings: Never    Marital Status: Widowed    Tobacco Counseling Counseling given: Not Answered   Clinical Intake:                  Diabetic?NO         Activities of Daily Living    02/20/2022    3:05 PM  In your present state of health, do you have any difficulty performing the following activities:  Hearing? 0  Vision? 0  Difficulty concentrating or making decisions? 1  Comment Patient states sometimes she forget things  Walking or climbing stairs? 1  Dressing or bathing? 0  Doing errands, shopping? 0  Preparing Food and eating ? N  Using the Toilet? N  In the past six months, have you accidently leaked urine? N  Do you have problems with loss of bowel control? N  Managing your Medications? N  Managing your Finances? N  Housekeeping  or managing your Housekeeping? N    Patient Care Team: [name], Hershal Coria as PCP - General (Physician Assistant) Lane Hacker, [facility] (Pharmacist)  Indicate any recent Medical Services you may have received from other than [facility] providers in the past year (date may be approximate).     Assessment:   This is a routine wellness examination for Miranda Moses.  Hearing/Vision screen No results found.  Dietary issues and exercise activities discussed: Current Exercise Habits: The patient does not participate in regular exercise at present   Goals Addressed   None   Depression Screen    02/20/2022    3:04 PM 02/20/2022    3:01 PM 10/03/2020   10:05 AM  PHQ 2/9 Scores  PHQ - 2 Score 0 0 0  PHQ- 9 Score 0      Fall Risk    02/20/2022    3:04 PM 10/03/2020   10:05 AM 05/12/2018    4:33 PM  [place] in the past year? 0 0 0  Comment   [place] Telephone Survey: data to providers prior to load  Number falls in past yr: 0 0   Injury with Fall? 0 0   Risk for fall due to :  No Fall Risks   Follow up Falls evaluation completed Falls evaluation completed     [place]:  Any stairs in or around the home? No  If so, are there any without handrails?  N/A Home free of loose throw rugs in walkways, pet beds, electrical cords, etc?  Yes  Adequate lighting in your home to reduce risk of falls? Yes   ASSISTIVE DEVICES UTILIZED TO PREVENT FALLS:  Life alert? No  Use of a cane, walker or w/c? No  Grab bars in the bathroom? No  Shower chair or bench in shower? No  Elevated toilet seat or a handicapped toilet? Yes   TIMED UP AND GO:  Was the test performed? Yes .  Length of time to ambulate 10 feet: 15 sec.   Gait steady and fast without use of assistive device  Cognitive Function:        02/20/2022    3:12 PM  6CIT Screen  What Year? 0 points  What month? 0 points  What time? 0 points  Count back from 20 0 points  Months in reverse 0 points  Repeat phrase 0 points  Total Score 0 points    Immunizations Immunization History  Administered Date(s) Administered   Fluad Quad(high Dose 65+) 03/23/2020, 02/19/2021   Influenza-Unspecified 03/13/2017, 06/24/2018, 05/07/2019   PFIZER(Purple Top)SARS-COV-2 Vaccination 12/09/2019, 12/28/2019   Pneumococcal Conjugate-13 04/13/2014   Pneumococcal Polysaccharide-23 08/21/2021   Pneumococcal-Unspecified 05/31/2009   Zoster Recombinat (Shingrix) 10/25/2020   Zoster, Live 02/11/2014    TDAP status: Due, Education has been provided regarding the importance of this vaccine. Advised may receive this vaccine at local pharmacy or Health Dept. Aware to provide a copy of the vaccination record if obtained from local pharmacy or Health Dept. Verbalized acceptance and understanding.  Flu Vaccine status: Due, Education has been provided regarding the importance of this vaccine. Advised may receive this vaccine at local pharmacy or Health Dept. Aware to provide a copy of the vaccination record if obtained from local pharmacy or Health Dept. Verbalized acceptance and understanding.  Pneumococcal vaccine status: Up to date  Covid-19 vaccine status: Completed vaccines  Qualifies for Shingles Vaccine? Yes   Zostavax completed No   Shingrix Completed?: Yes  Screening  Tests Health Maintenance  Topic Date Due   TETANUS/TDAP  Never done   MAMMOGRAM  02/24/2021   INFLUENZA VACCINE  01/22/2022   Zoster Vaccines- Shingrix (2 of 2) 05/21/2022 (Originally 12/20/2020)   Pneumonia Vaccine 10+ Years old  Completed   DEXA SCAN  Completed   HPV VACCINES  Aged Out   COVID-19 Vaccine  Discontinued   Hepatitis C Screening  Discontinued    Health Maintenance  Health Maintenance Due  Topic Date Due   TETANUS/TDAP  Never done   MAMMOGRAM  02/24/2021   INFLUENZA VACCINE  01/22/2022    Colorectal cancer screening: No longer required.   Mammogram status: Completed 02/19/22. Repeat every year  Bone Density status: Completed 02/2020. Results reflect: Bone density results: OSTEOPENIA. Repeat every 2 years.  Lung Cancer Screening: (Low Dose CT Chest recommended if Age 53-80 years, 30 pack-year currently smoking OR have quit w/in 15years.) does not qualify.   Lung Cancer Screening Referral: N/A  Additional Screening:  Hepatitis C Screening: does not qualify; Completed N/A  Vision Screening: Recommended annual ophthalmology exams for early detection of glaucoma and other disorders of the eye. Is the patient up to date with their annual eye exam?  Yes  Who is the provider or what is the name of the office in which the patient attends annual eye exams? [place] If pt is not established with a provider, would they like to be referred to a provider to establish care? No .   Dental Screening: Recommended annual dental exams for proper oral hygiene  Community Resource Referral / Chronic Care Management: CRR required this visit?  No   CCM required this visit?   Patient already has CCM.     Plan:     I have personally reviewed and noted the following in the patient's chart:   Medical and social history Use of alcohol, tobacco or illicit drugs  Current medications and supplements including opioid prescriptions. Patient is not currently taking opioid  prescriptions. Functional ability and status Nutritional status Physical activity Advanced directives List of other physicians Hospitalizations, surgeries, and ER visits in previous 12 months Vitals Screenings to include cognitive, depression, and falls Referrals and appointments  In addition, I have reviewed and discussed with patient certain preventive protocols, quality metrics, and best practice recommendations. A written personalized care plan for preventive services as well as general preventive health recommendations were provided to patient.     Burna Forts, [place]   02/20/2022   Nurse Notes:  Miranda Moses , Thank you for taking time to come for your Medicare Wellness Visit. I appreciate your ongoing commitment to your health goals. Please review the following plan we discussed and let me know if I can assist you in the future.   These are the goals we discussed:  Goals      Learn More About My Health     Timeframe:  Long-Range Goal Priority:  High Start Date:         08/24/2020                    Expected End Date:           08/24/2021             Follow Up Date 02/28/2021    - tell my story and reason for my visit - repeat what I heard to make sure I understand - bring a list of my medicines to the visit - speak up  when I don't understand    Why is this important?   The best way to learn about your health and care is by talking to the doctor and nurse.  They will answer your questions and give you information in the way that you like best.    Notes:      Lifestyle Change-Hypertension     Timeframe:  Long-Range Goal Priority:  High Start Date:        08/24/2020                     Expected End Date:     08/24/2021                  Follow Up Date 02/28/2021    - agree to work together to make changes - ask questions to understand    Why is this important?   The changes that you are asked to make may be hard to do.  This is especially true when the changes  are life-long.  Knowing why it is important to you is the first step.  Working on the change with your family or support person helps you not feel alone.  Reward yourself and family or support person when goals are met. This can be an activity you choose like bowling, hiking, biking, swimming or shooting hoops.     Notes:      Pharmacy Care Plan     CARE PLAN ENTRY (see longitudinal plan of care for additional care plan information)  Current Barriers:  Chronic Disease Management support, education, and care coordination needs related to Hypertension and Coronary Artery Disease   Hypertension BP Readings from Last 3 Encounters:  02/16/20 (!) 108/52  02/10/20 108/62  11/08/19 138/82  Pharmacist Clinical Goal(s): Over the next 90 days, patient will work with PharmD and providers to maintain BP goal <130/80 Current regimen:  Furosemide 40 mg daily prn edema Isosorbide mn 30 mg daily  Metoprolol tartrate 25 mg bid Interventions: Discussed healthy diet choices and incorporating more vegetables in diet.  Discussed goals of exercise. Encouraged patient to work up slowly to >150 minutes each week on exercise bike.  Reviewed home blood pressure reading.  Patient self care activities - Over the next 90 days, patient will: Check BP monthly, document, and provide at future appointments Ensure daily salt intake < 2300 mg/day  Hyperlipidemia/CAD Lab Results  Component Value Date/Time   [facility] 86 02/10/2020 09:57 AM  Pharmacist Clinical Goal(s): Over the next 90 days, patient will work with PharmD and providers to achieve LDL goal < 70 Current regimen:  aspirin EC 81 mg daily Atorvastatin 20 mg daily Interventions: Discussed healthy diet and exercise goals.  Encouraged patient to begin working on >150 minutes on exercise bike each week of moderate exercise.  Updated lipid panel scheduled for 08/22/2019. Will review at next visit.   Patient self care activities - Over the next 90 days,  patient will: Continue to take medication daily as prescribed.  Work on [employer] and increasing exercise.   Diabetes Lab Results  Component Value Date/Time   HGBA1C 7.1 (H) 02/10/2020 09:57 AM   HGBA1C 6.7 (H) 11/08/2019 10:10 AM  Pharmacist Clinical Goal(s): Over the next 90 days, patient will work with PharmD and providers to achieve A1c goal <7% Current regimen:  Diet and Exercise Interventions: Discussed importance of healthy diet and monitoring carbohydrate and sugar intake.  Encouraged patient the importance of exercise in managing elevated blood sugar. Discussed  goal of 150 minutes each week.  Discussed the benefit of weight loss in managing/preventing diabetes.  Patient self care activities - Over the next 90 days, patient will: Patient plans to watch diet more closely after Christmas. Will work on increasing her time on exercise bike and balancing her plate.   Afib Pharmacist Clinical Goal(s) Over the next 90 days, patient will work with PharmD and providers to manage Afib and prevent blood clots Current regimen:  Eliquis 5 mg bid  Metoprolol tartrate 25 mg bid  Interventions: Reviewed cost of Eliquis and options to make more affordable.  Encouraged patient to apply for Extra help on medicare to make medications more affordable. Provided patient [facility] SHIIP phone number to request an application ([phone]).  Reviewed patient assistance application requirements to receive Eliquis through [employer]. Patient doesn't feel that she will meet the 3% out of pocket requirement. Patient will call pharmacist if that changes.  Pharmacist coordinated 1 month of Eliquis samples to be picked up in the office at patient convenience.  Patient self care activities - Over the next 90 days, patient will: Fill out application for Medicare Extra Help.  Contact pharmacist if needs to proceed with patient assistance.  Pick up samples from office for additional month of  Eliquis.   Medication management Pharmacist Clinical Goal(s): Over the next 90 days, patient will work with PharmD and providers to maintain optimal medication adherence Current pharmacy: [facility] Interventions Comprehensive medication review performed. Continue current medication management strategy.  Patient self care activities - Over the next 90 days, patient will: Focus on medication adherence by continuing to use pill box Take medications as prescribed Report any questions or concerns to PharmD and/or provider(s)  Initial goal documentation      Track and Manage Symptoms-Heart Failure     Timeframe:  Long-Range Goal Priority:  High Start Date:      08/24/2020           Expected End Date:  08/24/2021               Follow Up Date  02/28/2021       - begin a heart failure diary - eat more whole grains, fruits and vegetables, lean meats and healthy fats - know when to call the doctor - track symptoms and what helps feel better or worse    Why is this important?   You will be able to handle your symptoms better if you keep track of them.  Making some simple changes to your lifestyle will help.  Eating healthy is one thing you can do to take good care of yourself.    Notes:         This is a list of the screening recommended for you and due dates:  Health Maintenance  Topic Date Due   Tetanus Vaccine  Never done   Mammogram  02/24/2021   Flu Shot  01/22/2022   Zoster (Shingles) Vaccine (2 of 2) 05/21/2022*   Pneumonia Vaccine  Completed   DEXA scan (bone density measurement)  Completed   HPV Vaccine  Aged Out   COVID-19 Vaccine  Discontinued   Hepatitis C Screening: USPSTF Recommendation to screen - Ages 68-79 yo.  Discontinued  *Topic was postponed. The date shown is not the original due date.

## 2022-02-22 ENCOUNTER — Other Ambulatory Visit: Payer: Self-pay

## 2022-02-22 DIAGNOSIS — Z1231 Encounter for screening mammogram for malignant neoplasm of breast: Secondary | ICD-10-CM

## 2022-02-22 DIAGNOSIS — R928 Other abnormal and inconclusive findings on diagnostic imaging of breast: Secondary | ICD-10-CM

## 2022-03-04 ENCOUNTER — Telehealth: Payer: Self-pay | Admitting: Physician Assistant

## 2022-03-04 NOTE — Telephone Encounter (Signed)
   Miranda Moses has been scheduled for the following appointment:  WHAT: DIAGNOSTIC MAMMOGRAM  WHERE: [place]  DATE: 03/14/22 TIME: 10:10 AM CHECK-IN   Patient has been made aware.

## 2022-03-14 ENCOUNTER — Telehealth: Payer: Self-pay

## 2022-03-14 DIAGNOSIS — R928 Other abnormal and inconclusive findings on diagnostic imaging of breast: Secondary | ICD-10-CM | POA: Diagnosis not present

## 2022-03-14 NOTE — Progress Notes (Signed)
Chronic Care Management Pharmacy Assistant   Name: Miranda Moses  MRN: [id number] DOB: Sep 23, 1940   Reason for Encounter: General Adherence Call     Recent office visits:  02/20/22 Miranda Hammock LPN. Seen for Medicare Annual Wellness. No med changes.  02/18/22 Miranda Duncans PA-C. Seen for routine visit. No med changes.  Recent consult visits:  None  Hospital visits:  None  Medications: Outpatient Encounter Medications as of 03/14/2022  Medication Sig   apixaban (ELIQUIS) 5 MG TABS tablet Take 1 tablet (5 mg total) by mouth 2 (two) times daily.   Ascorbic Acid (VITAMIN C) 100 MG tablet Take 100 mg by mouth daily.   atorvastatin (LIPITOR) 20 MG tablet TAKE 1 TABLET(20 MG) BY MOUTH EVERY DAY   benzonatate (TESSALON) 100 MG capsule Take 2 capsules (200 mg total) by mouth 3 (three) times daily as needed for cough.   calcium carbonate (OSCAL) 1500 (600 Ca) MG TABS tablet Take 600 mg of elemental calcium by mouth daily.   Cholecalciferol (VITAMIN D3 PO) Take 2 capsules by mouth daily.   furosemide (LASIX) 40 MG tablet TAKE 1 TABLET(40 MG) BY MOUTH DAILY AS NEEDED FOR SWELLING   isosorbide mononitrate (IMDUR) 30 MG 24 hr tablet TAKE 1 TABLET BY MOUTH EVERY DAY   loratadine (CLARITIN) 10 MG tablet Take 1 tablet (10 mg total) by mouth daily.   metoprolol tartrate (LOPRESSOR) 25 MG tablet TAKE 1 TABLET(25 MG) BY MOUTH TWICE DAILY   Multiple Vitamin (MULTIVITAMIN WITH MINERALS) TABS tablet Take 1 tablet by mouth daily.   Multiple Vitamins-Minerals (OCUVITE PRESERVISION PO) Take 1 tablet by mouth in the morning and at bedtime.   omeprazole (PRILOSEC) 20 MG capsule Take 1 capsule (20 mg total) by mouth daily.   OVER THE COUNTER MEDICATION Take 1 tablet by mouth daily. Circulation vitamin from QVC -   triamcinolone (NASACORT) 55 MCG/ACT AERO nasal inhaler Place 1 spray into the nose daily as needed for allergies (allergies).   vitamin E 200 UNIT capsule Take 200 Units by mouth daily.   No  facility-administered encounter medications on file as of 03/14/2022.    Miranda Moses for [name] Review Call   Chart Review:  Have there been any documented new, changed, or discontinued medications since last visit? No  Has there been any documented recent hospitalizations or ED visits since last visit with Clinical Pharmacist? No Brief Summary (including medication and/or Diagnosis changes):   Adherence Review:  Does the Clinical Pharmacist Assistant have access to adherence rates? Yes Adherence rates for STAR metric medications (List medication(s)/day supply/ last 2 fill dates). Atorvastatin 12/29/21-09/24/21 90ds Adherence rates for medications indicated for disease state being reviewed (List medication(s)/day supply/ last 2 fill dates). Does the patient have >5 day gap between last estimated fill dates for any of the above medications or other medication gaps? No Reason for medication gaps.   Disease State Questions:  Able to connect with Patient? Yes Did patient have any problems with their health recently? No  Have you had any admissions or emergency room visits or worsening of your condition(s) since last visit? No  Have you had any visits with new specialists or providers since your last visit? No  Have you had any new health care problem(s) since your last visit? No  Have you run out of any of your medications since you last spoke with clinical pharmacist? No  Are there any medications you are not taking as prescribed? No  Are you having any issues  or side effects with your medications? No  Do you have any other health concerns or questions you want to discuss with your Clinical Pharmacist before your next visit? Yes Note additional concerns and questions from Patient.Pt has been having issues with her left knee for years. She stated is bigger than the other one and she woke up one more with bad pain in it. She stated there is like a bump and it seems to be  getting worse. She has seen the orthopedics and recommended a knee replacement but she feels this is something else.  Are there any health concerns that you feel we can do a better job addressing? No  Are you having any problems with any of the following since the last visit:   Sleeping  Details:Pt has a hard time sleeping but she stated she usually catches this up with another night of good sleep. 12. Any falls since last visit? No  Details: 13. Any increased or uncontrolled pain since last visit? No   14. Next visit Type: telephone       Visit with:Miranda Moses CPP        Date:04/09/22        Time:11:00am   94. Additional Details? No    Miranda Moses, [facility] Catering manager  [phone]

## 2022-03-15 ENCOUNTER — Other Ambulatory Visit: Payer: Self-pay

## 2022-03-15 DIAGNOSIS — R928 Other abnormal and inconclusive findings on diagnostic imaging of breast: Secondary | ICD-10-CM

## 2022-03-26 ENCOUNTER — Other Ambulatory Visit: Payer: Self-pay | Admitting: Physician Assistant

## 2022-03-26 ENCOUNTER — Other Ambulatory Visit: Payer: Self-pay | Admitting: Cardiology

## 2022-03-31 NOTE — Progress Notes (Deleted)
Cardiology Office Note:    Date:  03/31/2022   ID:  Miranda Moses, DOB March 01, 1941, MRN [id number]  PCP:  [name], PA-C  Cardiologist:  [name], MD    Referring MD: [name], PA-C    ASSESSMENT:    1. Hypertensive heart disease with heart failure ([place])   2. Longstanding persistent atrial fibrillation ([place])   3. High risk medication use   4. Chronic anticoagulation   5. Coronary artery disease of native artery of native heart with stable angina pectoris ([place])   6. Mixed hyperlipidemia    PLAN:    In order of problems listed above:  ***   Next appointment: ***   Medication Adjustments/Labs and Tests Ordered: Current medicines are reviewed at length with the patient today.  Concerns regarding medicines are outlined above.  No orders of the defined types were placed in this encounter.  No orders of the defined types were placed in this encounter.   No chief complaint on file.   History of Present Illness:    Miranda Moses is a 81 y.o. female with a hx of hypertensive heart disease with heart failure persistent atrial fibrillation with rate control including digoxin and anticoagulation hyperlipidemia and CAD last seen 03/02/2021. Compliance with diet, lifestyle and medications: *** Past Medical History:  Diagnosis Date   Angina pectoris ([place]) 02/18/2018   CAD in native artery    by CT coronary 10/2018 >> medical therapy    CHF (congestive heart failure), NYHA class I, chronic, diastolic ([place]) 9/79/8921   Chronic diastolic (congestive) heart failure (HCC)    Coronary artery disease of native artery of native heart with stable angina pectoris ([place]) 08/09/2019   COVID-19 virus infection    Encounter for vaccination 10/03/2020   Family history of coronary artery disease 01/30/2018   Gastro-esophageal reflux disease without esophagitis 01/30/2018   GERD (gastroesophageal reflux disease)    Health maintenance examination 10/03/2020   History of pulmonary embolism 08/23/2019    Hyperlipidemia    Hypertension    Hypertensive heart disease with heart failure ([place]) 01/30/2018   Mixed hyperlipidemia 02/18/2018   New onset atrial fibrillation ([place])    Other fatigue 02/10/2020   Other specified menopausal and perimenopausal disorders 02/10/2020   PAD (peripheral artery disease) ([place]) 08/28/2018   PAH (pulmonary artery hypertension) ([place]) 08/28/2018   Pre-diabetes    Prediabetes 02/10/2020   Pulmonary embolus ([place])    Pulmonary hypertension ([place])    Vitamin D deficiency 08/09/2019    Past Surgical History:  Procedure Laterality Date   ABDOMINAL HYSTERECTOMY     CATARACT EXTRACTION Left 03/2014   CATARACT EXTRACTION Right 05/2014   COLONOSCOPY  11/05/2013    Current Medications: No outpatient medications have been marked as taking for the 04/01/22 encounter (Appointment) with Richardo Priest, MD.     Allergies:   Clarithromycin, Codeine, and Penicillins   Social History   Socioeconomic History   Marital status: Widowed    Spouse name: Not on file   Number of children: 1   Years of education: Not on file   Highest education level: Not on file  Occupational History   Occupation: retired  Tobacco Use   Smoking status: Never   Smokeless tobacco: Never  Vaping Use   Vaping Use: Never used  Substance and Sexual Activity   Alcohol use: Never   Drug use: Never   Sexual activity: Not on file  Other Topics Concern   Not on file  Social History Narrative  Not on file   Social Determinants of Health   Financial Resource Strain: Low Risk  (02/20/2022)   Overall Financial Resource Strain (CARDIA)    Difficulty of Paying Living Expenses: Not very hard  Food Insecurity: No Food Insecurity (02/20/2022)   Hunger Vital Sign    Worried About Running Out of Food in the Last Year: Never true    Ran Out of Food in the Last Year: Never true  Transportation Needs: No Transportation Needs (02/20/2022)   PRAPARE - Hydrologist (Medical): No     Lack of Transportation (Non-Medical): No  Physical Activity: Inactive (02/20/2022)   Exercise Vital Sign    Days of Exercise per Week: 0 days    Minutes of Exercise per Session: 0 min  Stress: No Stress Concern Present (02/20/2022)   [place]    Feeling of Stress : Only a little  Social Connections: Moderately Isolated (02/20/2022)   Social Connection and Isolation Panel [NHANES]    Frequency of Communication with Friends and Family: More than three times a week    Frequency of Social Gatherings with Friends and Family: More than three times a week    Attends Religious Services: 1 to 4 times per year    Active Member of [employer] or Organizations: No    Attends Archivist Meetings: Never    Marital Status: Widowed     Family History: The patient's ***family history includes Heart disease in her father. ROS:   Please see the history of present illness.    All other systems reviewed and are negative.  EKGs/Labs/Other Studies Reviewed:    The following studies were reviewed today:  EKG:  EKG ordered today and personally reviewed.  The ekg ordered today demonstrates *** Echocardiogram performed 10/14/2018 showed ejection fraction 60 to 65% normal right ventricular size and function moderate left atrial enlargement and pulmonary hypertension with a estimated peak pulmonary systolic pressure 65 mmHg.  Left ventricular diastolic dysfunction was seen with pseudonormalization consistent with diastolic heart failure.   Lower extremity ABI was normal in the right lower extremity and mildly reduced in the left lower extremity.   Cardiac CTA 11/06/2018 showed the pulmonary arteries being normal size.  The left main coronary artery mild 25 to 50% stenosis.  The left anterior descending coronary artery proximally had a 50 to 69% stenosis with normal FFR in the mid and distal vessel had diffuse disease.  Left circumflex coronary  artery had moderate proximal 50 to 69% stenosis in the right coronary artery had a severe calcific plaque in the ostial right coronary artery extending into the proximal portion with stenosis greater than 70%.  The study was sent for [facility] which showed diminished flow in the distal left circumflex only Recent Labs: 02/18/2022: ALT 10; BUN 21; Creatinine, Ser 0.86; Hemoglobin 13.0; Platelets 175; Potassium 3.9; Sodium 140; TSH 1.410  Recent Lipid Panel    Component Value Date/Time   CHOL 157 02/18/2022 1042   TRIG 110 02/18/2022 1042   HDL 56 02/18/2022 1042   CHOLHDL 2.8 02/18/2022 1042   LDLCALC 81 02/18/2022 1042    Physical Exam:    VS:  There were no vitals taken for this visit.    Wt Readings from Last 3 Encounters:  02/18/22 244 lb 3.2 oz (110.8 kg)  08/21/21 242 lb (109.8 kg)  07/24/21 244 lb 3.2 oz (110.8 kg)     GEN: *** Well nourished, well  developed in no acute distress HEENT: Normal NECK: No JVD; No carotid bruits LYMPHATICS: No lymphadenopathy CARDIAC: ***RRR, no murmurs, rubs, gallops RESPIRATORY:  Clear to auscultation without rales, wheezing or rhonchi  ABDOMEN: Soft, non-tender, non-distended MUSCULOSKELETAL:  No edema; No deformity  SKIN: Warm and dry NEUROLOGIC:  Alert and oriented x 3 PSYCHIATRIC:  Normal affect    Signed, [name], MD  03/31/2022 7:20 PM    [facility]

## 2022-04-01 ENCOUNTER — Ambulatory Visit: Payer: Medicare Other | Attending: Cardiology | Admitting: Cardiology

## 2022-04-01 DIAGNOSIS — I4811 Longstanding persistent atrial fibrillation: Secondary | ICD-10-CM

## 2022-04-01 DIAGNOSIS — I25118 Atherosclerotic heart disease of native coronary artery with other forms of angina pectoris: Secondary | ICD-10-CM

## 2022-04-01 DIAGNOSIS — Z79899 Other long term (current) drug therapy: Secondary | ICD-10-CM

## 2022-04-01 DIAGNOSIS — I110 Hypertensive heart disease with heart failure: Secondary | ICD-10-CM

## 2022-04-01 DIAGNOSIS — Z7901 Long term (current) use of anticoagulants: Secondary | ICD-10-CM

## 2022-04-01 DIAGNOSIS — E782 Mixed hyperlipidemia: Secondary | ICD-10-CM

## 2022-04-03 ENCOUNTER — Telehealth: Payer: Self-pay

## 2022-04-03 NOTE — Chronic Care Management (AMB) (Signed)
04-03-2022: Patient's appointment with Miranda Moses CPP on 04-09-2022 was rescheduled to November per patient's request.   Jeannette How CMA Clinical Pharmacist Assistant [phone]'

## 2022-04-09 ENCOUNTER — Ambulatory Visit: Payer: Medicare Other | Attending: Cardiology | Admitting: Cardiology

## 2022-04-09 ENCOUNTER — Encounter: Payer: Self-pay | Admitting: Cardiology

## 2022-04-09 ENCOUNTER — Telehealth: Payer: Medicare Other

## 2022-04-09 VITALS — BP 122/70 | HR 76 | Ht 65.0 in | Wt 244.8 lb

## 2022-04-09 DIAGNOSIS — I25118 Atherosclerotic heart disease of native coronary artery with other forms of angina pectoris: Secondary | ICD-10-CM | POA: Insufficient documentation

## 2022-04-09 DIAGNOSIS — I4811 Longstanding persistent atrial fibrillation: Secondary | ICD-10-CM | POA: Insufficient documentation

## 2022-04-09 DIAGNOSIS — Z7901 Long term (current) use of anticoagulants: Secondary | ICD-10-CM | POA: Insufficient documentation

## 2022-04-09 DIAGNOSIS — E782 Mixed hyperlipidemia: Secondary | ICD-10-CM | POA: Diagnosis not present

## 2022-04-09 DIAGNOSIS — I110 Hypertensive heart disease with heart failure: Secondary | ICD-10-CM | POA: Insufficient documentation

## 2022-04-09 NOTE — Progress Notes (Signed)
Cardiology Office Note:    Date:  04/09/2022   ID:  Miranda Moses, DOB [birth year], MRN [id number]  PCP:  [name], PA-C  Cardiologist:  [name], MD    Referring MD: [name], PA-C    ASSESSMENT:    1. Hypertensive heart disease with heart failure ([place])   2. Longstanding persistent atrial fibrillation ([place])   3. Chronic anticoagulation   4. Coronary artery disease of native artery of native heart with stable angina pectoris ([place])   5. Mixed hyperlipidemia    PLAN:    In order of problems listed above:  She has done well with hypertensive heart disease and heart failure however I am very concerned about missing her diuretic she is assured me she will get back to taking it daily and continue her other cardiovascular medications including Imdur beta-blocker and her statin. Stable rate is controlled with her beta-blocker and she is on appropriate dose anticoagulant without bleeding complication continue both Continue her anticoagulant moderate stroke risk Stable doing well with CAD no anginal discomfort continue beta-blocker oral nitrate and high intensity statin with her LDL cholesterol at target   Next appointment: 1 year    Medication Adjustments/Labs and Tests Ordered: Current medicines are reviewed at length with the patient today.  Concerns regarding medicines are outlined above.  No orders of the defined types were placed in this encounter.  No orders of the defined types were placed in this encounter.   No chief complaint on file.   History of Present Illness:    Miranda Moses is a 81 y.o. female with a hx of hypertensive heart disease with heart failure persistent atrial fibrillation with rate control and anticoagulation  hyperlipidemia and coronary artery disease last seen 03/02/2021. Compliance with diet, lifestyle and medications: Yes  Unfortunately she skips her diuretic and takes it a few days a week she notices her weight is variable and she has  peripheral edema and is agreed to take her diuretic daily She is sedentary but is not having exercise intolerance shortness of breath orthopnea chest pain palpitation or syncope I urged her to have at least a 5 to 10-minute walking activity daily to meet minimal thresholds for cardiovascular benefit and encouraged her to get a smart watch Fitbit to monitor her atrial fibrillation Her recent labs 02/18/2022 shows a cholesterol 157 LDL 81 A1c 6.2% hemoglobin 13.0 creatinine 0.86 potassium 3.9  Cardiac studies reviewed:  Echocardiogram performed 10/14/2018 showed ejection fraction 60 to 65% normal right ventricular size and function moderate left atrial enlargement and pulmonary hypertension with a estimated peak pulmonary systolic pressure 65 mmHg.  Left ventricular diastolic dysfunction was seen with pseudonormalization consistent with diastolic heart failure.   Lower extremity ABI was normal in the right lower extremity and mildly reduced in the left lower extremity.   Cardiac CTA 11/06/2018 showed the pulmonary arteries being normal size.  The left main coronary artery mild 25 to 50% stenosis.  The left anterior descending coronary artery proximally had a 50 to 69% stenosis with normal FFR in the mid and distal vessel had diffuse disease.  Left circumflex coronary artery had moderate proximal 50 to 69% stenosis in the right coronary artery had a severe calcific plaque in the ostial right coronary artery extending into the proximal portion with stenosis greater than 70%.  The study was sent for [facility] which showed diminished flow in the distal left circumflex only Past Medical History:  Diagnosis Date   Angina pectoris ([place]) 02/18/2018   CAD  in native artery    by CT coronary 10/2018 >> medical therapy    CHF (congestive heart failure), NYHA class I, chronic, diastolic (HCC) 2/67/1245   Chronic diastolic (congestive) heart failure (HCC)    Coronary artery disease of native artery of native heart with  stable angina pectoris ([place]) 08/09/2019   COVID-19 virus infection    Encounter for vaccination 10/03/2020   Family history of coronary artery disease 01/30/2018   Gastro-esophageal reflux disease without esophagitis 01/30/2018   GERD (gastroesophageal reflux disease)    Health maintenance examination 10/03/2020   History of pulmonary embolism 08/23/2019   Hyperlipidemia    Hypertension    Hypertensive heart disease with heart failure ([place]) 01/30/2018   Mixed hyperlipidemia 02/18/2018   New onset atrial fibrillation ([place])    Other fatigue 02/10/2020   Other specified menopausal and perimenopausal disorders 02/10/2020   PAD (peripheral artery disease) ([place]) 08/28/2018   PAH (pulmonary artery hypertension) ([place]) 08/28/2018   Pre-diabetes    Prediabetes 02/10/2020   Pulmonary embolus ([place])    Pulmonary hypertension ([place])    Vitamin D deficiency 08/09/2019    Past Surgical History:  Procedure Laterality Date   ABDOMINAL HYSTERECTOMY     CATARACT EXTRACTION Left 03/2014   CATARACT EXTRACTION Right 05/2014   COLONOSCOPY  11/05/2013    Current Medications: Current Meds  Medication Sig   apixaban (ELIQUIS) 5 MG TABS tablet Take 1 tablet (5 mg total) by mouth 2 (two) times daily.   Ascorbic Acid (VITAMIN C) 100 MG tablet Take 100 mg by mouth daily.   atorvastatin (LIPITOR) 20 MG tablet TAKE 1 TABLET(20 MG) BY MOUTH EVERY DAY   benzonatate (TESSALON) 100 MG capsule Take 2 capsules (200 mg total) by mouth 3 (three) times daily as needed for cough.   calcium carbonate (OSCAL) 1500 (600 Ca) MG TABS tablet Take 600 mg of elemental calcium by mouth daily.   Cholecalciferol (VITAMIN D3 PO) Take 2 capsules by mouth daily.   furosemide (LASIX) 40 MG tablet TAKE 1 TABLET(40 MG) BY MOUTH DAILY AS NEEDED FOR SWELLING   isosorbide mononitrate (IMDUR) 30 MG 24 hr tablet TAKE 1 TABLET BY MOUTH EVERY DAY   loratadine (CLARITIN) 10 MG tablet Take 1 tablet (10 mg total) by mouth daily. (Patient taking differently: Take  10 mg by mouth daily as needed for allergies.)   metoprolol tartrate (LOPRESSOR) 25 MG tablet TAKE 1 TABLET(25 MG) BY MOUTH TWICE DAILY   Multiple Vitamin (MULTIVITAMIN WITH MINERALS) TABS tablet Take 1 tablet by mouth daily.   Multiple Vitamins-Minerals (OCUVITE PRESERVISION PO) Take 1 tablet by mouth in the morning and at bedtime.   omeprazole (PRILOSEC) 20 MG capsule Take 1 capsule (20 mg total) by mouth daily.   OVER THE COUNTER MEDICATION Take 1 tablet by mouth daily. Circulation vitamin from QVC -   triamcinolone (NASACORT) 55 MCG/ACT AERO nasal inhaler Place 1 spray into the nose daily as needed for allergies (allergies).   vitamin E 200 UNIT capsule Take 200 Units by mouth daily.     Allergies:   Clarithromycin, Codeine, and Penicillins   Social History   Socioeconomic History   Marital status: Widowed    Spouse name: Not on file   Number of children: 1   Years of education: Not on file   Highest education level: Not on file  Occupational History   Occupation: retired  Tobacco Use   Smoking status: Never   Smokeless tobacco: Never  Vaping Use   Vaping Use:  Never used  Substance and Sexual Activity   Alcohol use: Never   Drug use: Never   Sexual activity: Not on file  Other Topics Concern   Not on file  Social History Narrative   Not on file   Social Determinants of Health   Financial Resource Strain: Low Risk  (02/20/2022)   Overall Financial Resource Strain (CARDIA)    Difficulty of Paying Living Expenses: Not very hard  Food Insecurity: No Food Insecurity (02/20/2022)   Hunger Vital Sign    Worried About Running Out of Food in the Last Year: Never true    Ran Out of Food in the Last Year: Never true  Transportation Needs: No Transportation Needs (02/20/2022)   PRAPARE - Hydrologist (Medical): No    Lack of Transportation (Non-Medical): No  Physical Activity: Inactive (02/20/2022)   Exercise Vital Sign    Days of Exercise per Week:  0 days    Minutes of Exercise per Session: 0 min  Stress: No Stress Concern Present (02/20/2022)   [place]    Feeling of Stress : Only a little  Social Connections: Moderately Isolated (02/20/2022)   Social Connection and Isolation Panel [NHANES]    Frequency of Communication with Friends and Family: More than three times a week    Frequency of Social Gatherings with Friends and Family: More than three times a week    Attends Religious Services: 1 to 4 times per year    Active Member of [employer] or Organizations: No    Attends Archivist Meetings: Never    Marital Status: Widowed     Family History: The patient's family history includes Heart disease in her father. ROS:   Please see the history of present illness.    All other systems reviewed and are negative.  EKGs/Labs/Other Studies Reviewed:    The following studies were reviewed today:  EKG:  EKG ordered today and personally reviewed.  The ekg ordered today demonstrates atrial fibrillation controlled ventricular rate poor R wave progression  Recent Labs: 02/18/2022: ALT 10; BUN 21; Creatinine, Ser 0.86; Hemoglobin 13.0; Platelets 175; Potassium 3.9; Sodium 140; TSH 1.410  Recent Lipid Panel    Component Value Date/Time   CHOL 157 02/18/2022 1042   TRIG 110 02/18/2022 1042   HDL 56 02/18/2022 1042   CHOLHDL 2.8 02/18/2022 1042   LDLCALC 81 02/18/2022 1042    Physical Exam:    VS:  BP 122/70 (BP Location: Right Arm, Patient Position: Sitting)   Pulse 76   Ht '5\' 5"'$  (1.651 m)   Wt 244 lb 12.8 oz (111 kg)   SpO2 98%   BMI 40.74 kg/m     Wt Readings from Last 3 Encounters:  04/09/22 244 lb 12.8 oz (111 kg)  02/18/22 244 lb 3.2 oz (110.8 kg)  08/21/21 242 lb (109.8 kg)     GEN: Appears her age well nourished, well developed in no acute distress obese BMI exceeds 40 HEENT: Normal NECK: No JVD; No carotid bruits LYMPHATICS: No  lymphadenopathy CARDIAC: Irregular rate and rhythm  no murmurs, rubs, gallops RESPIRATORY:  Clear to auscultation without rales, wheezing or rhonchi  ABDOMEN: Soft, non-tender, non-distended MUSCULOSKELETAL:  No edema; No deformity  SKIN: Warm and dry NEUROLOGIC:  Alert and oriented x 3 PSYCHIATRIC:  Normal affect    Signed, [name], MD  04/09/2022 10:28 AM    [facility]

## 2022-04-09 NOTE — Patient Instructions (Signed)
Medication Instructions:  Your physician has recommended you make the following change in your medication:  Be sure and take your fluid pill (Furosemide) daily  *If you need a refill on your cardiac medications before your next appointment, please call your pharmacy*   Lab Work: NONE If you have labs (blood work) drawn today and your tests are completely normal, you will receive your results only by: [place] (if you have MyChart) OR A paper copy in the mail If you have any lab test that is abnormal or we need to change your treatment, we will call you to review the results.   Testing/Procedures: NONE   Follow-Up: At [facility], you and your health needs are our priority.  As part of our continuing mission to provide you with exceptional heart care, we have created designated Provider Care Teams.  These Care Teams include your primary Cardiologist (physician) and Advanced Practice Providers (APPs -  Physician Assistants and Nurse Practitioners) who all work together to provide you with the care you need, when you need it.  We recommend signing up for the patient portal called "MyChart".  Sign up information is provided on this After Visit Summary.  MyChart is used to connect with patients for Virtual Visits (Telemedicine).  Patients are able to view lab/test results, encounter notes, upcoming appointments, etc.  Non-urgent messages can be sent to your provider as well.   To learn more about what you can do with MyChart, go to [email or web address].    Your next appointment:   9 month(s)  The format for your next appointment:   In Person  Provider:   Kirk Ruths, MD or [name], MD    Other Instructions Recommend a Fit Bit Watch to monitor for Atrial Fibrillation  Important Information About Sugar

## 2022-04-24 ENCOUNTER — Telehealth: Payer: Self-pay

## 2022-04-24 NOTE — Chronic Care Management (AMB) (Signed)
    Shelbia Scinto was reminded to have all medications, supplements and any blood glucose and blood pressure readings available for review with Arizona Constable, Pharm. D, at her telephone visit on 04-25-2022 at 10:00..  Questions: Have you had any recent office visit or specialist visit outside of [facility]? Patient stated no  Are there any concerns you would like to discuss during your office visit? Patient stated issues with left knee  Are you having any problems obtaining your medications? (Whether it pharmacy issues or cost) Patient state no  If patient has any PAP medications ask if they are having any problems getting their PAP medication or refill? No PAP medications  Care Gaps: Tdap overdue AWV overdue Flu vaccine overdue  Star Rating Drug: Atorvastatin 20 mg- Last filled 03-26-2022 90 DS  Any gaps in medications fill history? No  [place] Pharmacist Assistant [phone]

## 2022-04-25 ENCOUNTER — Ambulatory Visit (INDEPENDENT_AMBULATORY_CARE_PROVIDER_SITE_OTHER): Payer: Medicare Other

## 2022-04-25 ENCOUNTER — Other Ambulatory Visit: Payer: Self-pay | Admitting: Physician Assistant

## 2022-04-25 DIAGNOSIS — I110 Hypertensive heart disease with heart failure: Secondary | ICD-10-CM

## 2022-04-25 DIAGNOSIS — N958 Other specified menopausal and perimenopausal disorders: Secondary | ICD-10-CM

## 2022-04-25 DIAGNOSIS — E782 Mixed hyperlipidemia: Secondary | ICD-10-CM

## 2022-04-25 DIAGNOSIS — K219 Gastro-esophageal reflux disease without esophagitis: Secondary | ICD-10-CM

## 2022-04-25 NOTE — Progress Notes (Signed)
O

## 2022-04-25 NOTE — Patient Instructions (Signed)
Visit Information   Goals Addressed   None    Patient Care Plan: CCM Pharmacy Care Plan     Problem Identified: afib, cad, chf, gerd   Priority: High  Onset Date: 08/24/2020     Long-Range Goal: Disease Management   Start Date: 08/24/2020  Expected End Date: 08/24/2021  Recent Progress: On track  Priority: High  Note:    Current Barriers:  Unable to independently afford treatment regimen  Pharmacist Clinical Goal(s):  Over the next 90 days, patient will verbalize ability to afford treatment regimen through collaboration with PharmD and provider.   Interventions: 1:1 collaboration with [name], PA-C regarding development and update of comprehensive plan of care as evidenced by provider attestation and co-signature Inter-disciplinary care team collaboration (see longitudinal plan of care) Comprehensive medication review performed; medication list updated in electronic medical record  Hyperlipidemia: (LDL goal < 55) The 10-year ASCVD risk score (Arnett DK, et al., 2019) is: 26.1%   Values used to calculate the score:     Age: 81 years     Sex: Female     Is Non-Hispanic African American: No     Diabetic: No     Tobacco smoker: No     Systolic Blood Pressure: 627 mmHg     Is BP treated: Yes     HDL Cholesterol: 53 mg/dL     Total Cholesterol: 149 mg/dL Lab Results  Component Value Date   CHOL 149 02/19/2021   CHOL 158 08/21/2020   CHOL 147 02/10/2020   Lab Results  Component Value Date   HDL 53 02/19/2021   HDL 54 08/21/2020   HDL 39 (L) 02/10/2020   Lab Results  Component Value Date   LDLCALC 79 02/19/2021   LDLCALC 87 08/21/2020   LDLCALC 86 02/10/2020   Lab Results  Component Value Date   TRIG 90 02/19/2021   TRIG 91 08/21/2020   TRIG 122 02/10/2020   Lab Results  Component Value Date   CHOLHDL 2.8 02/19/2021   CHOLHDL 2.9 08/21/2020   CHOLHDL 3.8 02/10/2020  No results found for: LDLDIRECT -Not ideally controlled -Current  treatment: Atorvastatin 20 mg daily Appropriate, Effective, Safe, Accessible -Medications previously tried: none reported  -Current dietary patterns: avoids salt. Eats out. Doesn't have a huge appetite. Likes fish and cheeseburgers from restaurants.  -Current exercise habits: no regular.  -Educated on Cholesterol goals;  Benefits of statin for ASCVD risk reduction; Importance of limiting foods high in cholesterol; Exercise goal of 150 minutes per week; -Counseled on diet and exercise extensively Plan: At goal,  patient stable/ symptoms controlled   Diabetes (A1c goal <7%) Lab Results  Component Value Date   HGBA1C 6.3 (H) 02/19/2021   HGBA1C 7.1 (H) 02/10/2020   HGBA1C 6.7 (H) 11/08/2019   Lab Results  Component Value Date   LDLCALC 79 02/19/2021   CREATININE 0.99 02/19/2021   Lab Results  Component Value Date   NA 141 02/19/2021   K 3.9 02/19/2021   CREATININE 0.99 02/19/2021   EGFR 58 (L) 02/19/2021   GFRNONAA 58 (L) 02/10/2020   GLUCOSE 113 (H) 02/19/2021   Lab Results  Component Value Date   WBC 5.0 02/19/2021   HGB 12.9 02/19/2021   HCT 40.5 02/19/2021   MCV 85 02/19/2021   PLT 191 02/19/2021  -Controlled -Current medications: Diet and lifestyle  -Medications previously tried: none  -Current home glucose readings fasting glucose: doesn't check post prandial glucose: doesn't check -Denies hypoglycemic/hyperglycemic symptoms -Current meal patterns:  breakfast: eggs  Lunch and dinner: enjoys grabbing a hamburger with her grandson   -Current exercise: no regular exercise  -Educated onA1c and blood sugar goals; Exercise goal of 150 minutes per week; Carbohydrate counting and/or plate method -Counseled to check feet daily and get yearly eye exams -Counseled on diet and exercise extensively  Atrial Fibrillation (Goal: prevent stroke and major bleeding) -Controlled -CHADSVASC: 7 -Current treatment: Rate control:  Metoprolol tartrate 25 mg bid Appropriate,  Effective, Safe, Accessible Anticoagulation:  Eliquis 5 mg bid Appropriate, Effective, Safe, Accessible -Medications previously tried: none reported -Home BP and HR readings:  October 2022: 04/17/21: 112/70 November 2023: 04/25/22: 116/68 -Counseled on increased risk of stroke due to Afib and benefits of anticoagulation for stroke prevention; importance of adherence to anticoagulant exactly as prescribed; avoidance of NSAIDs due to increased bleeding risk with anticoagulants; -Recommended to continue current medication April 2022: Collaborated with [facility] to fill Eliquis. Patient aware that copay and deductible will be $522 this year. We will be able to complete patient assistance at that time due to meting 3% out of pocket requirement. Patient will stop in office to sign completed Eliquis form and bring proof of social security income and out of pocket cost on medications for 2022. Pharmacist faxed prior authorization to Dr. Joya Gaskins office for patient.  Prior authorization approved 08/28/2020. Pharmacist updated patient via phone and requested fill of medication through [place].  Feb 2023: Patient states meds are going well and no issues. Will update PAP in December November 2023: Renew PAP  Heart Failure (Goal: manage symptoms and prevent exacerbations) -Controlled -Last ejection fraction: 60-65%  (Date: 09/2018) -HF type: Hypertensive Heart disease with heart failure -NYHA Class: I (no actitivty limitation) -Current treatment: furosemide 40 mg daily prn edema Appropriate, Effective, Safe, Accessible Isosorbide mn 30 mg daily Appropriate, Effective, Safe, Accessible Metoprolol tartrate 25 mg bid Appropriate, Effective, Safe, Accessible -Medications previously tried: none reported  -Current home BP/HR readings: in frequently checking but 110/55 and 107/63 mmHg -Current dietary habits: eats out several times each week. Doesn't salt food. Minimal appetite.  -Current exercise habits:  no regular exercise -Educated on Benefits of medications for managing symptoms and prolonging life Importance of weighing daily; if you gain more than 3 pounds in one day or 5 pounds in one week, contact provider Importance of blood pressure control -Counseled on diet and exercise extensively     Osteoporosis / Osteopenia (Goal minimize risk of fractures/breaks) -Controlled -Last DEXA Scan: 09/21   T-Score femoral neck: -1.4  T-Score forearm radius: -1.8  10-year probability of major osteoporotic fracture: 17.7  10-year probability of hip fracture: 8.8 -Patient is a candidate for pharmacologic treatment due to T-Score -1.0 to -2.5 and 10-year risk of hip fracture > 3% -Current treatment  Vitamin D daily Appropriate, Effective, Safe, Accessible Calcium 600 mg daily Appropriate, Effective, Safe, Accessible -Medications previously tried: none reported  -Recommend [phone] units of vitamin D daily. Recommend 1200 mg of calcium daily from dietary and supplemental sources. Recommend weight-bearing and muscle strengthening exercises for building and maintaining bone density. October 2023: Due for Dexa. Recommend Calcium Citrate  GERD (Goal: minimize symptoms of reflux ) -Controlled -Current treatment  Omeprazole 20 mg daily Appropriate, Effective, Safe, Accessible -Medications previously tried: none reported  October 2022: patient denied any Hx of bleeds, counseled on trying to take every other day to prevent bone degradation   Patient Goals/Self-Care Activities Over the next 90 days, patient will:  - take medications as prescribed focus on medication  adherence by using pill box target a minimum of 150 minutes of moderate intensity exercise weekly  Follow Up Plan: Telephone follow up appointment with care management team member scheduled for: May 2024  [place] Constable, [place].D. - [phone]      Ms. Renbarger was given information about Chronic Care Management services today  including:  CCM service includes personalized support from designated clinical staff supervised by her physician, including individualized plan of care and coordination with other care providers 24/7 contact phone numbers for assistance for urgent and routine care needs. Standard insurance, coinsurance, copays and deductibles apply for chronic care management only during months in which we provide at least 20 minutes of these services. Most insurances cover these services at 100%, however patients may be responsible for any copay, coinsurance and/or deductible if applicable. This service may help you avoid the need for more expensive face-to-face services. Only one practitioner may furnish and bill the service in a calendar month. The patient may stop CCM services at any time (effective at the end of the month) by phone call to the office staff.  Patient agreed to services and verbal consent obtained.   The patient verbalized understanding of instructions, educational materials, and care plan provided today and DECLINED offer to receive copy of patient instructions, educational materials, and care plan.  The pharmacy team will reach out to the patient again over the next 60 days.   Lane Hacker, [place]

## 2022-04-25 NOTE — Progress Notes (Signed)
Chronic Care Management Pharmacy Note  04/25/2022 Name:  Miranda Moses MRN:  [id number] DOB:  1941/06/04  Summary: Pleasant 81 year old female presents for F/U CCM visit. She is widowed, husband passed in 2019. She has a daughter and a grandson who is in [place] that she goes out to eat for lunch with every day, she tells me it's the highlight of her day.   Recommendations to PCP  Patient due for DEXA (Was due in Sept 2023). Patient is open to getting done. Recommend putting in order for one ASAP  Subjective: Miranda Moses is an 81 y.o. year old female who is a primary patient of [name], [place].  The CCM team was consulted for assistance with disease management and care coordination needs.    Engaged with patient by telephone for follow up visit in response to provider referral for pharmacy case management and/or care coordination services.   Consent to Services:  The patient was given information about Chronic Care Management services, agreed to services, and gave verbal consent prior to initiation of services.  Please see initial visit note for detailed documentation.   Patient Care Team: [name], Hershal Coria as PCP - General (Physician Assistant) Lane Hacker, [facility] (Pharmacist)  Recent office visits:  02/20/22 [name] LPN. Seen for Medicare Annual Wellness. No med changes.   02/18/22 [name] PA-C. Seen for routine visit. No med changes.   Recent consult visits:  None   Hospital visits:  None  Objective:  Lab Results  Component Value Date   CREATININE 0.86 02/18/2022   BUN 21 02/18/2022   GFRNONAA 58 (L) 02/10/2020   GFRAA 67 02/10/2020   NA 140 02/18/2022   K 3.9 02/18/2022   CALCIUM 9.5 02/18/2022   CO2 22 02/18/2022    Lab Results  Component Value Date/Time   HGBA1C 6.2 (H) 02/18/2022 10:42 AM   HGBA1C 6.2 (H) 08/21/2021 11:12 AM    Last diabetic Eye exam: No results found for: "HMDIABEYEEXA"  Last diabetic Foot exam: No results found for:  "HMDIABFOOTEX"   Lab Results  Component Value Date   CHOL 157 02/18/2022   HDL 56 02/18/2022   LDLCALC 81 02/18/2022   TRIG 110 02/18/2022   CHOLHDL 2.8 02/18/2022       Latest Ref Rng & Units 02/18/2022   10:42 AM 08/21/2021   11:12 AM 02/19/2021   10:43 AM  Hepatic Function  Total Protein 6.0 - 8.5 g/dL 6.7  6.5  6.7   Albumin 3.8 - 4.8 g/dL 4.1  3.8  3.9   AST 0 - 40 IU/L _0 ALT 0 - 32 IU/L _1 Alk Phosphatase 44 - 121 IU/L 132  137  137   Total Bilirubin 0.0 - 1.2 mg/dL 1.0  0.7  0.8     Lab Results  Component Value Date/Time   TSH 1.410 02/18/2022 10:42 AM   TSH 1.600 02/10/2020 09:57 AM       Latest Ref Rng & Units 02/18/2022   10:42 AM 08/21/2021   11:12 AM 02/19/2021   10:43 AM  CBC  WBC 3.4 - 10.8 x10E3/uL 3.5  4.0  5.0   Hemoglobin 11.1 - 15.9 g/dL 13.0  12.4  12.9   Hematocrit 34.0 - 46.6 % 40.4  38.1  40.5   Platelets 150 - 450 x10E3/uL 175  182  191     Lab Results  Component Value Date/Time  VD25OH 45.4 02/18/2022 10:42 AM   VD25OH 58.5 08/21/2021 11:12 AM    Clinical ASCVD: Yes  The 10-year ASCVD risk score (Arnett DK, et al., 2019) is: 29.2%   Values used to calculate the score:     Age: 10 years     Sex: Female     Is Non-Hispanic African American: No     Diabetic: No     Tobacco smoker: No     Systolic Blood Pressure: 161 mmHg     Is BP treated: Yes     HDL Cholesterol: 56 mg/dL     Total Cholesterol: 157 mg/dL       02/20/2022    3:04 PM 02/20/2022    3:01 PM 10/03/2020   10:05 AM  Depression screen PHQ 2/9  Decreased Interest 0 0 0  Down, Depressed, Hopeless 0 0 0  PHQ - 2 Score 0 0 0  Altered sleeping 0    Tired, decreased energy 0    Change in appetite 0    Feeling bad or failure about yourself  0    Trouble concentrating 0    Moving slowly or fidgety/restless 0    Suicidal thoughts 0    PHQ-9 Score 0    Difficult doing work/chores Not difficult at all       CHA2DS2-VASc Score =    The patient's score  is based upon:      Social History   Tobacco Use  Smoking Status Never  Smokeless Tobacco Never   BP Readings from Last 3 Encounters:  04/09/22 122/70  02/20/22 112/70  02/18/22 112/64   Pulse Readings from Last 3 Encounters:  04/09/22 76  02/20/22 77  02/18/22 93   Wt Readings from Last 3 Encounters:  04/09/22 244 lb 12.8 oz (111 kg)  02/18/22 244 lb 3.2 oz (110.8 kg)  08/21/21 242 lb (109.8 kg)    Assessment/Interventions: Review of patient past medical history, allergies, medications, health status, including review of consultants reports, laboratory and other test data, was performed as part of comprehensive evaluation and provision of chronic care management services.   SDOH:  (Social Determinants of Health) assessments and interventions performed: Yes SDOH Interventions    Flowsheet Row Chronic Care Management from 04/25/2022 in [place] from 02/20/2022 in [place] Management from 08/14/2021 in [place] Interventions     Food Insecurity Interventions -- Intervention Not Indicated --  Housing Interventions -- Intervention Not Indicated --  Transportation Interventions Intervention Not Indicated Intervention Not Indicated Intervention Not Indicated  Financial Strain Interventions Other (Comment)  [Eliquis PAP] Intervention Not Indicated Intervention Not Indicated  Physical Activity Interventions -- Intervention Not Indicated --  Stress Interventions -- Intervention Not Indicated --       CCM Care Plan  Allergies  Allergen Reactions   Clarithromycin     Numbness of extremities   Codeine Other (See Comments)    Caused pain   Penicillins Rash    Did it involve swelling of the face/tongue/throat, SOB, or low BP? No Did it involve sudden or severe rash/hives, skin peeling, or any reaction on the inside of your mouth or nose? Yes Did you need to seek medical attention at a hospital or doctor's  office? Yes When did it last happen?      20 years If all above answers are "NO", may proceed with cephalosporin use.     Medications Reviewed Today     Reviewed by  Toni Arthurs, CMA Estate agent) on 04/09/22 at 50  Med List Status: <None>   Medication Order Taking? Sig Documenting Provider Last Dose Status Informant  apixaban (ELIQUIS) 5 MG TABS tablet [id number] Yes Take 1 tablet (5 mg total) by mouth 2 (two) times daily. [name], PA-C Taking Active   Ascorbic Acid (VITAMIN C) 100 MG tablet [id number] Yes Take 100 mg by mouth daily. [provider] Taking Active   atorvastatin (LIPITOR) 20 MG tablet [id number] Yes TAKE 1 TABLET(20 MG) BY MOUTH EVERY DAY [name], PA-C Taking Active   benzonatate (TESSALON) 100 MG capsule [id number] Yes Take 2 capsules (200 mg total) by mouth 3 (three) times daily as needed for cough. Rip Harbour, NP Taking Active   calcium carbonate (OSCAL) 1500 (600 Ca) MG TABS tablet [id number] Yes Take 600 mg of elemental calcium by mouth daily. [provider] Taking Active   Cholecalciferol (VITAMIN D3 PO) [id number] Yes Take 2 capsules by mouth daily. [provider] Taking Active Self  furosemide (LASIX) 40 MG tablet [id number] Yes TAKE 1 TABLET(40 MG) BY MOUTH DAILY AS NEEDED FOR SWELLING [name], PA-C Taking Active   isosorbide mononitrate (IMDUR) 30 MG 24 hr tablet [id number] Yes TAKE 1 TABLET BY MOUTH EVERY DAY [name] Hilton Cork, MD Taking Active   loratadine (CLARITIN) 10 MG tablet [id number] Yes Take 1 tablet (10 mg total) by mouth daily.  Patient taking differently: Take 10 mg by mouth daily as needed for allergies.   Rip Harbour, NP Taking Active   metoprolol tartrate (LOPRESSOR) 25 MG tablet [id number] Yes TAKE 1 TABLET(25 MG) BY MOUTH TWICE DAILY [name] Hilton Cork, MD Taking Active   Multiple Vitamin (MULTIVITAMIN WITH MINERALS) TABS tablet [id number] Yes Take 1 tablet by mouth daily. [provider] Taking Active Self  Multiple Vitamins-Minerals (OCUVITE PRESERVISION PO) [id number] Yes Take 1 tablet by mouth in the morning and at bedtime. [provider] Taking Active   omeprazole (PRILOSEC) 20 MG capsule [id number] Yes Take 1 capsule (20 mg total) by mouth daily. [name], PA-C Taking Active   OVER THE COUNTER MEDICATION [id number] Yes Take 1 tablet by mouth daily. Circulation vitamin from [facility] - [provider] Taking Active   triamcinolone (NASACORT) 55 MCG/ACT AERO nasal inhaler [id number] Yes Place 1 spray into the nose daily as needed for allergies (allergies). [provider] Taking Active Self  vitamin E 200 UNIT capsule [id number] Yes Take 200 Units by mouth daily. [provider] Taking Active             Patient Active Problem List   Diagnosis Date Noted   Health maintenance examination 10/03/2020   Encounter for vaccination 10/03/2020   Pulmonary hypertension (HCC)    Pre-diabetes    Hypertension    Hyperlipidemia    GERD (gastroesophageal reflux disease)    Chronic diastolic (congestive) heart failure (HCC)    CAD in native artery    Prediabetes 02/10/2020   Other fatigue 02/10/2020   Other specified menopausal and perimenopausal disorders 02/10/2020   History of pulmonary embolism 08/23/2019   CHF (congestive heart failure), NYHA class I, chronic, diastolic ([place]) 70/26/3785   Vitamin D deficiency 08/09/2019   Coronary artery disease of native artery of native heart with stable angina pectoris ([place]) 08/09/2019   New onset atrial fibrillation (HCC)    Pulmonary embolus ([place])    COVID-19 virus infection    PAH (pulmonary artery hypertension) ([place]) 08/28/2018   PAD (peripheral artery  disease) ([place]) 08/28/2018   Angina pectoris ([place]) 02/18/2018   Mixed hyperlipidemia 02/18/2018   Family history of coronary artery disease 01/30/2018   Hypertensive heart disease with heart failure ([place]) 01/30/2018   Gastro-esophageal  reflux disease without esophagitis 01/30/2018    Immunization History  Administered Date(s) Administered   Fluad Quad(high Dose 65+) 03/23/2020, 02/19/2021   Influenza-Unspecified 03/13/2017, 06/24/2018, 05/07/2019   PFIZER(Purple Top)SARS-COV-2 Vaccination 12/09/2019, 12/28/2019   Pneumococcal Conjugate-13 04/13/2014   Pneumococcal Polysaccharide-23 08/21/2021   Pneumococcal-Unspecified 05/31/2009   Zoster Recombinat (Shingrix) 10/25/2020   Zoster, Live 02/11/2014    Conditions to be addressed/monitored:  Heart Failure, Coronary Artery Disease and GERD  Care Plan : [place]  Updates made by Lane Hacker, [place] since 04/25/2022 12:00 AM     Problem: afib, cad, chf, gerd   Priority: High  Onset Date: 08/24/2020     Long-Range Goal: Disease Management   Start Date: 08/24/2020  Expected End Date: 08/24/2021  Recent Progress: On track  Priority: High  Note:    Current Barriers:  Unable to independently afford treatment regimen  Pharmacist Clinical Goal(s):  Over the next 90 days, patient will verbalize ability to afford treatment regimen through collaboration with PharmD and provider.   Interventions: 1:1 collaboration with [name], PA-C regarding development and update of comprehensive plan of care as evidenced by provider attestation and co-signature Inter-disciplinary care team collaboration (see longitudinal plan of care) Comprehensive medication review performed; medication list updated in electronic medical record  Hyperlipidemia: (LDL goal < 55) The 10-year ASCVD risk score (Arnett DK, et al., 2019) is: 26.1%   Values used to calculate the score:     Age: 76 years     Sex: Female     Is Non-Hispanic African American: No     Diabetic: No     Tobacco smoker: No     Systolic Blood Pressure: 485 mmHg     Is BP treated: Yes     HDL Cholesterol: 53 mg/dL     Total Cholesterol: 149 mg/dL Lab Results  Component Value Date   CHOL 149 02/19/2021    CHOL 158 08/21/2020   CHOL 147 02/10/2020   Lab Results  Component Value Date   HDL 53 02/19/2021   HDL 54 08/21/2020   HDL 39 (L) 02/10/2020   Lab Results  Component Value Date   LDLCALC 79 02/19/2021   LDLCALC 87 08/21/2020   LDLCALC 86 02/10/2020   Lab Results  Component Value Date   TRIG 90 02/19/2021   TRIG 91 08/21/2020   TRIG 122 02/10/2020   Lab Results  Component Value Date   CHOLHDL 2.8 02/19/2021   CHOLHDL 2.9 08/21/2020   CHOLHDL 3.8 02/10/2020  No results found for: LDLDIRECT -Not ideally controlled -Current treatment: Atorvastatin 20 mg daily Appropriate, Effective, Safe, Accessible -Medications previously tried: none reported  -Current dietary patterns: avoids salt. Eats out. Doesn't have a huge appetite. Likes fish and cheeseburgers from restaurants.  -Current exercise habits: no regular.  -Educated on Cholesterol goals;  Benefits of statin for ASCVD risk reduction; Importance of limiting foods high in cholesterol; Exercise goal of 150 minutes per week; -Counseled on diet and exercise extensively Plan: At goal,  patient stable/ symptoms controlled   Diabetes (A1c goal <7%) Lab Results  Component Value Date   HGBA1C 6.3 (H) 02/19/2021   HGBA1C 7.1 (H) 02/10/2020   HGBA1C 6.7 (H) 11/08/2019   Lab Results  Component Value Date   LDLCALC 79 02/19/2021  CREATININE 0.99 02/19/2021   Lab Results  Component Value Date   NA 141 02/19/2021   K 3.9 02/19/2021   CREATININE 0.99 02/19/2021   EGFR 58 (L) 02/19/2021   GFRNONAA 58 (L) 02/10/2020   GLUCOSE 113 (H) 02/19/2021   Lab Results  Component Value Date   WBC 5.0 02/19/2021   HGB 12.9 02/19/2021   HCT 40.5 02/19/2021   MCV 85 02/19/2021   PLT 191 02/19/2021  -Controlled -Current medications: Diet and lifestyle  -Medications previously tried: none  -Current home glucose readings fasting glucose: doesn't check post prandial glucose: doesn't check -Denies hypoglycemic/hyperglycemic  symptoms -Current meal patterns:  breakfast: eggs  Lunch and dinner: enjoys grabbing a hamburger with her grandson   -Current exercise: no regular exercise  -Educated onA1c and blood sugar goals; Exercise goal of 150 minutes per week; Carbohydrate counting and/or plate method -Counseled to check feet daily and get yearly eye exams -Counseled on diet and exercise extensively  Atrial Fibrillation (Goal: prevent stroke and major bleeding) -Controlled -CHADSVASC: 7 -Current treatment: Rate control:  Metoprolol tartrate 25 mg bid Appropriate, Effective, Safe, Accessible Anticoagulation:  Eliquis 5 mg bid Appropriate, Effective, Safe, Accessible -Medications previously tried: none reported -Home BP and HR readings:  October 2022: 04/17/21: 112/70 November 2023: 04/25/22: 116/68 -Counseled on increased risk of stroke due to Afib and benefits of anticoagulation for stroke prevention; importance of adherence to anticoagulant exactly as prescribed; avoidance of NSAIDs due to increased bleeding risk with anticoagulants; -Recommended to continue current medication April 2022: Collaborated with [facility] to fill Eliquis. Patient aware that copay and deductible will be $522 this year. We will be able to complete patient assistance at that time due to meting 3% out of pocket requirement. Patient will stop in office to sign completed Eliquis form and bring proof of social security income and out of pocket cost on medications for 2022. Pharmacist faxed prior authorization to Dr. Joya Gaskins office for patient.  Prior authorization approved 08/28/2020. Pharmacist updated patient via phone and requested fill of medication through [facility].  Feb 2023: Patient states meds are going well and no issues. Will update PAP in December November 2023: Renew PAP  Heart Failure (Goal: manage symptoms and prevent exacerbations) -Controlled -Last ejection fraction: 60-65%  (Date: 09/2018) -HF type: Hypertensive  Heart disease with heart failure -NYHA Class: I (no actitivty limitation) -Current treatment: furosemide 40 mg daily prn edema Appropriate, Effective, Safe, Accessible Isosorbide mn 30 mg daily Appropriate, Effective, Safe, Accessible Metoprolol tartrate 25 mg bid Appropriate, Effective, Safe, Accessible -Medications previously tried: none reported  -Current home BP/HR readings: in frequently checking but 110/55 and 107/63 mmHg -Current dietary habits: eats out several times each week. Doesn't salt food. Minimal appetite.  -Current exercise habits: no regular exercise -Educated on Benefits of medications for managing symptoms and prolonging life Importance of weighing daily; if you gain more than 3 pounds in one day or 5 pounds in one week, contact provider Importance of blood pressure control -Counseled on diet and exercise extensively     Osteoporosis / Osteopenia (Goal minimize risk of fractures/breaks) -Controlled -Last DEXA Scan: 09/21   T-Score femoral neck: -1.4  T-Score forearm radius: -1.8  10-year probability of major osteoporotic fracture: 17.7  10-year probability of hip fracture: 8.8 -Patient is a candidate for pharmacologic treatment due to T-Score -1.0 to -2.5 and 10-year risk of hip fracture > 3% -Current treatment  Vitamin D daily Appropriate, Effective, Safe, Accessible Calcium 600 mg daily Appropriate, Effective, Safe, Accessible -Medications  previously tried: none reported  -Recommend [phone] units of vitamin D daily. Recommend 1200 mg of calcium daily from dietary and supplemental sources. Recommend weight-bearing and muscle strengthening exercises for building and maintaining bone density. October 2023: Due for Dexa. Recommend Calcium Citrate  GERD (Goal: minimize symptoms of reflux ) -Controlled -Current treatment  Omeprazole 20 mg daily Appropriate, Effective, Safe, Accessible -Medications previously tried: none reported  October 2022: patient denied any  Hx of bleeds, counseled on trying to take every other day to prevent bone degradation   Patient Goals/Self-Care Activities Over the next 90 days, patient will:  - take medications as prescribed focus on medication adherence by using pill box target a minimum of 150 minutes of moderate intensity exercise weekly  Follow Up Plan: Telephone follow up appointment with care management team member scheduled for: May 2024  [place]zona Constable, [place].D. - [phone]       Medication Assistance: Application for Low Income Subsidy  medication assistance program. in process.  Anticipated assistance start date uncertain .  See plan of care for additional detail. Patient was denied Low Income Subsidy. Pharmacist coordinating application for Eliquis patient assistance through [employer].  Eliquis PAP: -Approved 2023 -Start on PAP for 2024 November 2024  Patient's preferred pharmacy is:  [facility] [place], [address] AT [address] [place] [address] Phone: [phone] Fax: [phone]   Uses pill box? Yes Pt endorses good compliance but indicates she has forgotten to take medication out of her pill box at night occasionally. We discussed keeping it in a location where she will see it easily. Patient also encouraged to set a reminder alarm to take medication if needed.   We discussed: Current pharmacy is preferred with insurance plan and patient is satisfied with pharmacy services Patient decided to: Continue current medication management strategy  Care Plan and Follow Up Patient Decision:  Patient agrees to Care Plan and Follow-up.  Plan: Telephone follow up appointment with care management team member scheduled for:  May 2024  [place] Constable, [place].D. - [phone]

## 2022-04-29 ENCOUNTER — Encounter: Payer: Self-pay | Admitting: Physician Assistant

## 2022-05-09 ENCOUNTER — Other Ambulatory Visit: Payer: Self-pay

## 2022-05-09 DIAGNOSIS — J069 Acute upper respiratory infection, unspecified: Secondary | ICD-10-CM

## 2022-05-09 MED ORDER — LORATADINE 10 MG PO TABS: 10.0000 mg | ORAL_TABLET | Freq: Every day | ORAL | 0 refills | Status: DC | PRN

## 2022-05-14 NOTE — Telephone Encounter (Signed)
Patient called and stated she has been congestion for a week, she has been taking mucinex. Patient stated she is afraid to keep taking mucinex with her being on Lasix

## 2022-05-15 ENCOUNTER — Ambulatory Visit (INDEPENDENT_AMBULATORY_CARE_PROVIDER_SITE_OTHER): Payer: Medicare Other | Admitting: Physician Assistant

## 2022-05-15 ENCOUNTER — Encounter: Payer: Self-pay | Admitting: Physician Assistant

## 2022-05-15 VITALS — BP 130/86 | HR 68 | Temp 97.1°F | Ht 65.5 in | Wt 246.0 lb

## 2022-05-15 DIAGNOSIS — J069 Acute upper respiratory infection, unspecified: Secondary | ICD-10-CM

## 2022-05-15 LAB — POC COVID19 BINAXNOW: SARS Coronavirus 2 Ag: NEGATIVE

## 2022-05-15 MED ORDER — DOXYCYCLINE HYCLATE 100 MG PO TABS: 100.0000 mg | ORAL_TABLET | Freq: Two times a day (BID) | ORAL | 0 refills | Status: DC

## 2022-05-15 NOTE — Progress Notes (Signed)
Acute Office Visit  Subjective:    Patient ID: Miranda Moses, female    DOB: Jul 02, 1940, 81 y.o.   MRN: [id number]  Chief Complaint  Patient presents with   Cough   Nasal Congestion    HPI: Patient is in today for cough, congestion and sinus pressure for the past week.  States symptoms were worse yesterday but feels some better today.  Has had minimal cough.  Denies fever but has had malaise Has had mild sore throat and taking mucinex and loratadine Denies chest pain/dyspnea/edema  Past Medical History:  Diagnosis Date   Angina pectoris ([place]) 02/18/2018   CAD in native artery    by CT coronary 10/2018 >> medical therapy    CHF (congestive heart failure), NYHA class I, chronic, diastolic ([place]) 2/59/5638   Chronic diastolic (congestive) heart failure ([facility])    Coronary artery disease of native artery of native heart with stable angina pectoris ([place]) 08/09/2019   COVID-19 virus infection    Encounter for vaccination 10/03/2020   Family history of coronary artery disease 01/30/2018   Gastro-esophageal reflux disease without esophagitis 01/30/2018   GERD (gastroesophageal reflux disease)    Health maintenance examination 10/03/2020   History of pulmonary embolism 08/23/2019   Hyperlipidemia    Hypertension    Hypertensive heart disease with heart failure ([place]) 01/30/2018   Mixed hyperlipidemia 02/18/2018   New onset atrial fibrillation ([place])    Other fatigue 02/10/2020   Other specified menopausal and perimenopausal disorders 02/10/2020   PAD (peripheral artery disease) ([place]) 08/28/2018   PAH (pulmonary artery hypertension) ([place]) 08/28/2018   Pre-diabetes    Prediabetes 02/10/2020   Pulmonary embolus ([place])    Pulmonary hypertension ([place])    Vitamin D deficiency 08/09/2019    Past Surgical History:  Procedure Laterality Date   ABDOMINAL HYSTERECTOMY     CATARACT EXTRACTION Left 03/2014   CATARACT EXTRACTION Right 05/2014   COLONOSCOPY  11/05/2013    Family History  Problem Relation Age of  Onset   Heart disease Father     Social History   Socioeconomic History   Marital status: Widowed    Spouse name: Not on file   Number of children: 1   Years of education: Not on file   Highest education level: Not on file  Occupational History   Occupation: retired  Tobacco Use   Smoking status: Never   Smokeless tobacco: Never  Vaping Use   Vaping Use: Never used  Substance and Sexual Activity   Alcohol use: Never   Drug use: Never   Sexual activity: Not on file  Other Topics Concern   Not on file  Social History Narrative   Not on file   Social Determinants of Health   Financial Resource Strain: High Risk (04/25/2022)   Overall Financial Resource Strain (CARDIA)    Difficulty of Paying Living Expenses: Hard  Food Insecurity: No Food Insecurity (02/20/2022)   Hunger Vital Sign    Worried About Running Out of Food in the Last Year: Never true    Ran Out of Food in the Last Year: Never true  Transportation Needs: No Transportation Needs (04/25/2022)   PRAPARE - Hydrologist (Medical): No    Lack of Transportation (Non-Medical): No  Physical Activity: Inactive (02/20/2022)   Exercise Vital Sign    Days of Exercise per Week: 0 days    Minutes of Exercise per Session: 0 min  Stress: No Stress Concern Present (02/20/2022)   [place]  [employer] - Occupational Stress Questionnaire    Feeling of Stress : Only a little  Social Connections: Moderately Isolated (02/20/2022)   Social Connection and Isolation Panel [NHANES]    Frequency of Communication with Friends and Family: More than three times a week    Frequency of Social Gatherings with Friends and Family: More than three times a week    Attends Religious Services: 1 to 4 times per year    Active Member of [employer] or Organizations: No    Attends Archivist Meetings: Never    Marital Status: Widowed  Intimate Partner Violence: Not At Risk (02/20/2022)   Humiliation,  Afraid, Rape, and Kick questionnaire    Fear of Current or Ex-Partner: No    Emotionally Abused: No    Physically Abused: No    Sexually Abused: No    Outpatient Medications Prior to Visit  Medication Sig Dispense Refill   apixaban (ELIQUIS) 5 MG TABS tablet Take 1 tablet (5 mg total) by mouth 2 (two) times daily. 60 tablet 2   Ascorbic Acid (VITAMIN C) 100 MG tablet Take 100 mg by mouth daily.     atorvastatin (LIPITOR) 20 MG tablet TAKE 1 TABLET(20 MG) BY MOUTH EVERY DAY 90 tablet 1   benzonatate (TESSALON) 100 MG capsule Take 2 capsules (200 mg total) by mouth 3 (three) times daily as needed for cough. 20 capsule 0   calcium carbonate (OSCAL) 1500 (600 Ca) MG TABS tablet Take 600 mg of elemental calcium by mouth daily.     Cholecalciferol (VITAMIN D3 PO) Take 2 capsules by mouth daily.     furosemide (LASIX) 40 MG tablet TAKE 1 TABLET(40 MG) BY MOUTH DAILY AS NEEDED FOR SWELLING 90 tablet 1   isosorbide mononitrate (IMDUR) 30 MG 24 hr tablet TAKE 1 TABLET BY MOUTH EVERY DAY 90 tablet 1   loratadine (CLARITIN) 10 MG tablet Take 1 tablet (10 mg total) by mouth daily as needed for allergies. 90 tablet 0   metoprolol tartrate (LOPRESSOR) 25 MG tablet TAKE 1 TABLET(25 MG) BY MOUTH TWICE DAILY 180 tablet 3   Multiple Vitamin (MULTIVITAMIN WITH MINERALS) TABS tablet Take 1 tablet by mouth daily.     Multiple Vitamins-Minerals (OCUVITE PRESERVISION PO) Take 1 tablet by mouth in the morning and at bedtime.     omeprazole (PRILOSEC) 20 MG capsule Take 1 capsule (20 mg total) by mouth daily. 90 capsule 1   OVER THE COUNTER MEDICATION Take 1 tablet by mouth daily. Circulation vitamin from [facility] -     triamcinolone (NASACORT) 55 MCG/ACT AERO nasal inhaler Place 1 spray into the nose daily as needed for allergies (allergies).     vitamin E 200 UNIT capsule Take 200 Units by mouth daily.     No facility-administered medications prior to visit.    Allergies  Allergen Reactions   Clarithromycin      Numbness of extremities   Codeine Other (See Comments)    Caused pain   Penicillins Rash    Did it involve swelling of the face/tongue/throat, SOB, or low BP? No Did it involve sudden or severe rash/hives, skin peeling, or any reaction on the inside of your mouth or nose? Yes Did you need to seek medical attention at a hospital or doctor's office? Yes When did it last happen?      20 years If all above answers are "NO", may proceed with cephalosporin use.     Review of Systems CONSTITUTIONAL: see HPI E/N/T:  see HPI CARDIOVASCULAR: Negative for chest pain, dizziness, palpitations and pedal edema.  RESPIRATORY: see HPI GASTROINTESTINAL: Negative for abdominal pain, acid reflux symptoms, constipation, diarrhea, nausea and vomiting.       Objective:    PHYSICAL EXAM:   VS: BP 130/86 (BP Location: Left Arm, Patient Position: Sitting, Cuff Size: Large)   Pulse 68   Temp (!) 97.1 F (36.2 C) (Temporal)   Ht 5' 5.5" (1.664 m)   Wt 246 lb (111.6 kg)   SpO2 98%   BMI 40.31 kg/m   GEN: Well nourished, well developed, in no acute distress  HEENT: normal external ears and nose - normal external auditory canals and TMS -  - Lips, Teeth and Gums - normal  Oropharynx - erythema/pnd Cardiac: RRR; no murmurs, rubs,  Respiratory:  normal respiratory rate and pattern with no distress - normal breath sounds with no rales, rhonchi, wheezes or rubs Skin: warm and dry, no rash   Office Visit on 05/15/2022  Component Date Value Ref Range Status   SARS Coronavirus 2 Ag 05/15/2022 Negative  Negative Final       There are no preventive care reminders to display for this patient.   Lab Results  Component Value Date   TSH 1.410 02/18/2022   Lab Results  Component Value Date   WBC 3.5 02/18/2022   HGB 13.0 02/18/2022   HCT 40.4 02/18/2022   MCV 86 02/18/2022   PLT 175 02/18/2022   Lab Results  Component Value Date   NA 140 02/18/2022   K 3.9 02/18/2022   CO2 22 02/18/2022    GLUCOSE 107 (H) 02/18/2022   BUN 21 02/18/2022   CREATININE 0.86 02/18/2022   BILITOT 1.0 02/18/2022   ALKPHOS 132 (H) 02/18/2022   AST 17 02/18/2022   ALT 10 02/18/2022   PROT 6.7 02/18/2022   ALBUMIN 4.1 02/18/2022   CALCIUM 9.5 02/18/2022   ANIONGAP 10 07/29/2019   EGFR 69 02/18/2022   Lab Results  Component Value Date   CHOL 157 02/18/2022   Lab Results  Component Value Date   HDL 56 02/18/2022   Lab Results  Component Value Date   LDLCALC 81 02/18/2022   Lab Results  Component Value Date   TRIG 110 02/18/2022   Lab Results  Component Value Date   CHOLHDL 2.8 02/18/2022   Lab Results  Component Value Date   HGBA1C 6.2 (H) 02/18/2022       Assessment & Plan:   Problem List Items Addressed This Visit   None Visit Diagnoses     Acute upper respiratory infection    -  Primary   Relevant Medications   doxycycline (VIBRA-TABS) 100 MG tablet   Other Relevant Orders   POC COVID-19 BinaxNow (Completed) Continue mucinex and claritin      Meds ordered this encounter  Medications   doxycycline (VIBRA-TABS) 100 MG tablet    Sig: Take 1 tablet (100 mg total) by mouth 2 (two) times daily.    Dispense:  20 tablet    Refill:  0    Order Specific Question:   Supervising Provider    AnswerShelton Silvas    Orders Placed This Encounter  Procedures   POC COVID-19 BinaxNow     Follow-up: Return if symptoms worsen or fail to improve.  An After Visit Summary was printed and given to the patient.  Yetta Flock [facility] [phone]

## 2022-05-23 DIAGNOSIS — E782 Mixed hyperlipidemia: Secondary | ICD-10-CM

## 2022-05-23 DIAGNOSIS — I110 Hypertensive heart disease with heart failure: Secondary | ICD-10-CM

## 2022-05-30 ENCOUNTER — Telehealth: Payer: Self-pay

## 2022-05-30 NOTE — Progress Notes (Signed)
Chronic Care Management Pharmacy Assistant   Name: Katonya Blecher  MRN: [id number] DOB: 05-27-1941   Reason for Encounter: Disease State call for HTN   Recent office visits:  05/15/22 [name] PA-C. Seen for cold symptoms. Started on Doxycycline Hyclate '100mg'$   Recent consult visits:  None  Hospital visits:  None  Medications: Outpatient Encounter Medications as of 05/30/2022  Medication Sig   apixaban (ELIQUIS) 5 MG TABS tablet Take 1 tablet (5 mg total) by mouth 2 (two) times daily.   Ascorbic Acid (VITAMIN C) 100 MG tablet Take 100 mg by mouth daily.   atorvastatin (LIPITOR) 20 MG tablet TAKE 1 TABLET(20 MG) BY MOUTH EVERY DAY   benzonatate (TESSALON) 100 MG capsule Take 2 capsules (200 mg total) by mouth 3 (three) times daily as needed for cough.   calcium carbonate (OSCAL) 1500 (600 Ca) MG TABS tablet Take 600 mg of elemental calcium by mouth daily.   Cholecalciferol (VITAMIN D3 PO) Take 2 capsules by mouth daily.   doxycycline (VIBRA-TABS) 100 MG tablet Take 1 tablet (100 mg total) by mouth 2 (two) times daily.   furosemide (LASIX) 40 MG tablet TAKE 1 TABLET(40 MG) BY MOUTH DAILY AS NEEDED FOR SWELLING   isosorbide mononitrate (IMDUR) 30 MG 24 hr tablet TAKE 1 TABLET BY MOUTH EVERY DAY   loratadine (CLARITIN) 10 MG tablet Take 1 tablet (10 mg total) by mouth daily as needed for allergies.   metoprolol tartrate (LOPRESSOR) 25 MG tablet TAKE 1 TABLET(25 MG) BY MOUTH TWICE DAILY   Multiple Vitamin (MULTIVITAMIN WITH MINERALS) TABS tablet Take 1 tablet by mouth daily.   Multiple Vitamins-Minerals (OCUVITE PRESERVISION PO) Take 1 tablet by mouth in the morning and at bedtime.   omeprazole (PRILOSEC) 20 MG capsule Take 1 capsule (20 mg total) by mouth daily.   OVER THE COUNTER MEDICATION Take 1 tablet by mouth daily. Circulation vitamin from QVC -   triamcinolone (NASACORT) 55 MCG/ACT AERO nasal inhaler Place 1 spray into the nose daily as needed for allergies (allergies).    vitamin E 200 UNIT capsule Take 200 Units by mouth daily.   No facility-administered encounter medications on file as of 05/30/2022.     Recent Office Vitals: BP Readings from Last 3 Encounters:  05/15/22 130/86  04/09/22 122/70  02/20/22 112/70   Pulse Readings from Last 3 Encounters:  05/15/22 68  04/09/22 76  02/20/22 77    Wt Readings from Last 3 Encounters:  05/15/22 246 lb (111.6 kg)  04/09/22 244 lb 12.8 oz (111 kg)  02/18/22 244 lb 3.2 oz (110.8 kg)     Kidney Function Lab Results  Component Value Date/Time   CREATININE 0.86 02/18/2022 10:42 AM   CREATININE 0.85 08/21/2021 11:12 AM   GFRNONAA 58 (L) 02/10/2020 09:57 AM   GFRAA 67 02/10/2020 09:57 AM       Latest Ref Rng & Units 02/18/2022   10:42 AM 08/21/2021   11:12 AM 02/19/2021   10:43 AM  BMP  Glucose 70 - 99 mg/dL 107  114  113   BUN 8 - 27 mg/dL '21  18  20   '$ Creatinine 0.57 - 1.00 mg/dL 0.86  0.85  0.99   BUN/Creat Ratio 12 - '28 24  21  20   '$ Sodium 134 - 144 mmol/L 140  140  141   Potassium 3.5 - 5.2 mmol/L 3.9  3.8  3.9   Chloride 96 - 106 mmol/L 103  104  102   CO2  20 - 29 mmol/L '22  23  24   '$ Calcium 8.7 - 10.3 mg/dL 9.5  9.3  9.7      Current antihypertensive regimen:  Isosorbide Mononitrate '30mg'$  daily  Furosemide '40mg'$  daily prn Metoprolol Tartrate '25mg'$  twice daily  Eliquis '5mg'$  two times daily  Patient verbally confirms she is taking the above medications as directed. Yes  Do you receive your medications through PAP? Yes, Eliquis '5mg'$   If Yes, what is the status?Approved through 06/23/22  Last received? 1 week ago for 90ds  How often are you checking your Blood Pressure? daily  she checks her blood pressure in the morning before taking her medication.  Current home BP readings: 05/31/22 114/72, 12/92/3 112/75, 06/02/22 132/49  Wrist or arm cuff: Wrist   Any readings above 180/120? No  Question: Pt stated she feels her Furosemide '40mg'$  isn't working as much as it should. She stated if she  doesn't take it for a few days then she starts having breathing problems and takes one and it goes away and then she has a bit of incontinence. She stated she still has some swelling even being on it. Pt wants to know if there is something else that would work better where she sees a difference. Please advise.   Any recent hospitalizations or ED visits since last visit with CPP? No  Adherence Review: Is the patient currently on ACE/ARB medication? No Does the patient have >5 day gap between last estimated fill dates? CPP to review  Care Gaps: Last annual wellness visit?10/03/20  Star Rating Drugs:  Medication:  Last Fill: Day Supply None noted   [name], [place] Pharmacist Assistant  [phone]

## 2022-06-07 NOTE — Telephone Encounter (Signed)
Patient was notified to take the medication daily.

## 2022-06-21 ENCOUNTER — Ambulatory Visit (INDEPENDENT_AMBULATORY_CARE_PROVIDER_SITE_OTHER): Payer: Medicare Other | Admitting: Family Medicine

## 2022-06-21 VITALS — BP 122/82 | HR 98 | Temp 97.2°F | Ht 65.5 in | Wt 252.0 lb

## 2022-06-21 DIAGNOSIS — R7303 Prediabetes: Secondary | ICD-10-CM | POA: Diagnosis not present

## 2022-06-21 DIAGNOSIS — E782 Mixed hyperlipidemia: Secondary | ICD-10-CM | POA: Diagnosis not present

## 2022-06-21 DIAGNOSIS — R0602 Shortness of breath: Secondary | ICD-10-CM

## 2022-06-21 DIAGNOSIS — Z7984 Long term (current) use of oral hypoglycemic drugs: Secondary | ICD-10-CM | POA: Diagnosis not present

## 2022-06-21 DIAGNOSIS — E1169 Type 2 diabetes mellitus with other specified complication: Secondary | ICD-10-CM

## 2022-06-21 DIAGNOSIS — I5032 Chronic diastolic (congestive) heart failure: Secondary | ICD-10-CM | POA: Diagnosis not present

## 2022-06-21 DIAGNOSIS — I110 Hypertensive heart disease with heart failure: Secondary | ICD-10-CM

## 2022-06-21 LAB — POC COVID19 BINAXNOW: SARS Coronavirus 2 Ag: NEGATIVE

## 2022-06-21 MED ORDER — TORSEMIDE 20 MG PO TABS: 20.0000 mg | ORAL_TABLET | Freq: Every day | ORAL | 0 refills | Status: DC

## 2022-06-21 NOTE — Patient Instructions (Signed)
Change lasix to torsemide 20 mg daily.

## 2022-06-21 NOTE — Progress Notes (Signed)
Acute Office Visit  Subjective:    Patient ID: Miranda Moses, female    DOB: May 27, 1941, 81 y.o.   MRN: [id number]  Chief Complaint  Patient presents with   Shortness of breath    Sore throat, fatigue    HPI: Patient is in today for dyspnea with and without exertion, sore throat,.and fatigue.  Negative covid 19. Sleeps in recliner > year. Sleeps sitting up. Started this due to shoulder pain. Has not had xrays of shoulder. Patient has been taking lasix 40 mg daily recently.  Irregular HR.  No fevers.  Past Medical History:  Diagnosis Date   Angina pectoris (HCC) 02/18/2018   CAD in native artery    by CT coronary 10/2018 >> medical therapy    CHF (congestive heart failure), NYHA class I, chronic, diastolic (HCC) 08/09/2019   Chronic diastolic (congestive) heart failure (HCC)    Coronary artery disease of native artery of native heart with stable angina pectoris (HCC) 08/09/2019   COVID-19 virus infection    Encounter for vaccination 10/03/2020   Family history of coronary artery disease 01/30/2018   Gastro-esophageal reflux disease without esophagitis 01/30/2018   GERD (gastroesophageal reflux disease)    Health maintenance examination 10/03/2020   History of pulmonary embolism 08/23/2019   Hyperlipidemia    Hypertension    Hypertensive heart disease with heart failure (HCC) 01/30/2018   Mixed hyperlipidemia 02/18/2018   New onset atrial fibrillation (HCC)    Other fatigue 02/10/2020   Other specified menopausal and perimenopausal disorders 02/10/2020   PAD (peripheral artery disease) (HCC) 08/28/2018   PAH (pulmonary artery hypertension) (HCC) 08/28/2018   Pre-diabetes    Prediabetes 02/10/2020   Pulmonary embolus (HCC)    Pulmonary hypertension (HCC)    Vitamin D deficiency 08/09/2019    Past Surgical History:  Procedure Laterality Date   ABDOMINAL HYSTERECTOMY     CATARACT EXTRACTION Left 03/2014   CATARACT EXTRACTION Right 05/2014   COLONOSCOPY  11/05/2013    Family History   Problem Relation Age of Onset   Heart disease Father     Social History   Socioeconomic History   Marital status: Widowed    Spouse name: Not on file   Number of children: 1   Years of education: Not on file   Highest education level: Not on file  Occupational History   Occupation: retired  Tobacco Use   Smoking status: Never   Smokeless tobacco: Never  Vaping Use   Vaping Use: Never used  Substance and Sexual Activity   Alcohol use: Never   Drug use: Never   Sexual activity: Not on file  Other Topics Concern   Not on file  Social History Narrative   Not on file   Social Determinants of Health   Financial Resource Strain: High Risk (04/25/2022)   Overall Financial Resource Strain (CARDIA)    Difficulty of Paying Living Expenses: Hard  Food Insecurity: No Food Insecurity (02/20/2022)   Hunger Vital Sign    Worried About Running Out of Food in the Last Year: Never true    Ran Out of Food in the Last Year: Never true  Transportation Needs: No Transportation Needs (04/25/2022)   PRAPARE - Administrator, Civil Service (Medical): No    Lack of Transportation (Non-Medical): No  Physical Activity: Inactive (02/20/2022)   Exercise Vital Sign    Days of Exercise per Week: 0 days    Minutes of Exercise per Session: 0 min  Stress: No Stress  Concern Present (02/20/2022)   [employer] of Occupational Health - Occupational Stress Questionnaire    Feeling of Stress : Only a little  Social Connections: Moderately Isolated (02/20/2022)   Social Connection and Isolation Panel [NHANES]    Frequency of Communication with Friends and Family: More than three times a week    Frequency of Social Gatherings with Friends and Family: More than three times a week    Attends Religious Services: 1 to 4 times per year    Active Member of [employer] or Organizations: No    Attends Banker Meetings: Never    Marital Status: Widowed  Intimate Partner Violence: Not At Risk  (02/20/2022)   Humiliation, Afraid, Rape, and Kick questionnaire    Fear of Current or Ex-Partner: No    Emotionally Abused: No    Physically Abused: No    Sexually Abused: No    Outpatient Medications Prior to Visit  Medication Sig Dispense Refill   apixaban (ELIQUIS) 5 MG TABS tablet Take 1 tablet (5 mg total) by mouth 2 (two) times daily. 60 tablet 2   Ascorbic Acid (VITAMIN C) 100 MG tablet Take 100 mg by mouth daily.     atorvastatin (LIPITOR) 20 MG tablet TAKE 1 TABLET(20 MG) BY MOUTH EVERY DAY 90 tablet 1   calcium carbonate (OSCAL) 1500 (600 Ca) MG TABS tablet Take 600 mg of elemental calcium by mouth daily.     Cholecalciferol (VITAMIN D3 PO) Take 2 capsules by mouth daily.     isosorbide mononitrate (IMDUR) 30 MG 24 hr tablet TAKE 1 TABLET BY MOUTH EVERY DAY 90 tablet 1   loratadine (CLARITIN) 10 MG tablet Take 1 tablet (10 mg total) by mouth daily as needed for allergies. 90 tablet 0   metoprolol tartrate (LOPRESSOR) 25 MG tablet TAKE 1 TABLET(25 MG) BY MOUTH TWICE DAILY 180 tablet 3   Multiple Vitamin (MULTIVITAMIN WITH MINERALS) TABS tablet Take 1 tablet by mouth daily.     Multiple Vitamins-Minerals (OCUVITE PRESERVISION PO) Take 1 tablet by mouth in the morning and at bedtime.     omeprazole (PRILOSEC) 20 MG capsule Take 1 capsule (20 mg total) by mouth daily. 90 capsule 1   OVER THE COUNTER MEDICATION Take 1 tablet by mouth daily. Circulation vitamin from QVC -     triamcinolone (NASACORT) 55 MCG/ACT AERO nasal inhaler Place 1 spray into the nose daily as needed for allergies (allergies).     vitamin E 200 UNIT capsule Take 200 Units by mouth daily.     furosemide (LASIX) 40 MG tablet TAKE 1 TABLET(40 MG) BY MOUTH DAILY AS NEEDED FOR SWELLING 90 tablet 1   benzonatate (TESSALON) 100 MG capsule Take 2 capsules (200 mg total) by mouth 3 (three) times daily as needed for cough. 20 capsule 0   doxycycline (VIBRA-TABS) 100 MG tablet Take 1 tablet (100 mg total) by mouth 2 (two)  times daily. 20 tablet 0   No facility-administered medications prior to visit.    Allergies  Allergen Reactions   Clarithromycin     Numbness of extremities   Codeine Other (See Comments)    Caused pain   Penicillins Rash    Did it involve swelling of the face/tongue/throat, SOB, or low BP? No Did it involve sudden or severe rash/hives, skin peeling, or any reaction on the inside of your mouth or nose? Yes Did you need to seek medical attention at a hospital or doctor's office? Yes When did it last happen?  20 years If all above answers are "NO", may proceed with cephalosporin use.     Review of Systems  Constitutional:  Positive for fatigue. Negative for chills and fever.  HENT:  Positive for sore throat. Negative for congestion and ear pain.   Respiratory:  Positive for shortness of breath. Negative for cough.   Cardiovascular:  Negative for chest pain and palpitations.  Gastrointestinal:  Negative for abdominal pain, constipation, diarrhea, nausea and vomiting.  Endocrine: Negative for polydipsia, polyphagia and polyuria.  Genitourinary:  Negative for difficulty urinating and dysuria.  Musculoskeletal:  Negative for arthralgias, back pain and myalgias.  Skin:  Negative for rash.  Neurological:  Negative for headaches.  Psychiatric/Behavioral:  Negative for dysphoric mood. The patient is not nervous/anxious.        Objective:    Physical Exam Vitals reviewed.  Constitutional:      Appearance: She is obese.  Neck:     Vascular: No carotid bruit.  Cardiovascular:     Rate and Rhythm: Normal rate and regular rhythm.     Heart sounds: Normal heart sounds.  Pulmonary:     Effort: Pulmonary effort is normal.     Breath sounds: Normal breath sounds.  Abdominal:     General: Bowel sounds are normal.     Palpations: Abdomen is soft.     Tenderness: There is no abdominal tenderness.  Musculoskeletal:     Right lower leg: Edema present.     Left lower leg: Edema  present.  Neurological:     Mental Status: She is alert and oriented to person, place, and time.  Psychiatric:        Mood and Affect: Mood normal.        Behavior: Behavior normal.     BP 122/82 (BP Location: Right Arm, Patient Position: Sitting)   Pulse 98 Comment: Irregular  Temp (!) 97.2 F (36.2 C) (Temporal)   Ht 5' 5.5" (1.664 m)   Wt 252 lb (114.3 kg)   SpO2 98%   BMI 41.30 kg/m  Wt Readings from Last 3 Encounters:  06/21/22 252 lb (114.3 kg)  05/15/22 246 lb (111.6 kg)  04/09/22 244 lb 12.8 oz (111 kg)    Health Maintenance Due  Topic Date Due   FOOT EXAM  Never done   OPHTHALMOLOGY EXAM  Never done   Diabetic kidney evaluation - Urine ACR  Never done   DTaP/Tdap/Td (1 - Tdap) Never done   Zoster Vaccines- Shingrix (2 of 2) 12/20/2020   Medicare Annual Wellness (AWV)  10/03/2021   INFLUENZA VACCINE  01/22/2022   DEXA SCAN  02/24/2022    There are no preventive care reminders to display for this patient.   Lab Results  Component Value Date   TSH 1.410 02/18/2022   Lab Results  Component Value Date   WBC 4.6 06/21/2022   HGB 12.2 06/21/2022   HCT 37.6 06/21/2022   MCV 87 06/21/2022   PLT 179 06/21/2022   Lab Results  Component Value Date   NA 142 06/21/2022   K 3.4 (L) 06/21/2022   CO2 23 06/21/2022   GLUCOSE 101 (H) 06/21/2022   BUN 12 06/21/2022   CREATININE 0.87 06/21/2022   BILITOT 1.1 06/21/2022   ALKPHOS 138 (H) 06/21/2022   AST 14 06/21/2022   ALT 7 06/21/2022   PROT 6.7 06/21/2022   ALBUMIN 4.1 06/21/2022   CALCIUM 9.4 06/21/2022   ANIONGAP 10 07/29/2019   EGFR 68 06/21/2022   Lab Results  Component Value Date   CHOL 130 06/21/2022   Lab Results  Component Value Date   HDL 49 06/21/2022   Lab Results  Component Value Date   LDLCALC 66 06/21/2022   Lab Results  Component Value Date   TRIG 78 06/21/2022   Lab Results  Component Value Date   CHOLHDL 2.7 06/21/2022   Lab Results  Component Value Date   HGBA1C 6.2  (H) 06/21/2022       Assessment & Plan:   Problem List Items Addressed This Visit       Cardiovascular and Mediastinum   Hypertensive heart disease with heart failure (HCC) - Primary    Well controlled.  No changes to medicines. Continue with Lopressor 25 mg Twice a day, IMDUR 30 mg every day. Continue to work on eating a healthy diet and exercise.  Labs drawn today.        Relevant Medications   torsemide (DEMADEX) 20 MG tablet   Other Relevant Orders   CBC with Differential/Platelet (Completed)   Comprehensive metabolic panel (Completed)   Cardiovascular Risk Assessment (Completed)   Chronic diastolic (congestive) heart failure (HCC)    Change lasix to torsemide 20 mg daily.  Check labs.       Relevant Medications   torsemide (DEMADEX) 20 MG tablet   Other Relevant Orders   Pro b natriuretic peptide (Completed)     Endocrine   Combined hyperlipidemia associated with type 2 diabetes mellitus (HCC)    Control: Well controlled. Recommend check sugars fasting daily. Recommend check feet daily. Recommend annual eye exams. Medicines: none. Continue to work on eating a healthy diet and exercise.  Labs drawn today.         Relevant Medications   torsemide (DEMADEX) 20 MG tablet   Other Relevant Orders   Hemoglobin A1c (Completed)   Lipid panel (Completed)     Other   Mixed hyperlipidemia    Well controlled.  No changes to medicines. Continue with Atorvastatin 20 mg daily. Continue to work on eating a healthy diet and exercise.  Labs drawn today.        Relevant Medications   torsemide (DEMADEX) 20 MG tablet   Other Relevant Orders   Lipid panel (Completed)   Shortness of breath    Change lasix to torsemide 20 mg daily.       Relevant Medications   torsemide (DEMADEX) 20 MG tablet   Other Relevant Orders   Pro b natriuretic peptide (Completed)   POC COVID-19 BinaxNow (Completed)   Meds ordered this encounter  Medications   torsemide (DEMADEX) 20 MG  tablet    Sig: Take 1 tablet (20 mg total) by mouth daily.    Dispense:  90 tablet    Refill:  0    Orders Placed This Encounter  Procedures   CBC with Differential/Platelet   Comprehensive metabolic panel   Hemoglobin A1c   Lipid panel   Pro b natriuretic peptide   Cardiovascular Risk Assessment   POC COVID-19 BinaxNow     Follow-up: Return in about 2 months (around 08/26/2022) for chronic fasting.  An After Visit Summary was printed and given to the patient.  Tawny Asal, CMA, acting as a scribe for Blane Ohara, MD.,have documented all relevant documentation on the behalf of Blane Ohara, MD,as directed by  Blane Ohara, MD while in the presence of Blane Ohara, MD.   Blane Ohara, MD [facility] [phone]

## 2022-06-22 LAB — LIPID PANEL
Chol/HDL Ratio: 2.7 ratio (ref 0.0–4.4)
Cholesterol, Total: 130 mg/dL (ref 100–199)
HDL: 49 mg/dL (ref >39)
LDL Chol Calc (NIH): 66 mg/dL (ref 0–99)
Triglycerides: 78 mg/dL (ref 0–149)
VLDL Cholesterol Cal: 15 mg/dL (ref 5–40)

## 2022-06-22 LAB — COMPREHENSIVE METABOLIC PANEL
ALT: 7 IU/L (ref 0–32)
AST: 14 IU/L (ref 0–40)
Albumin/Globulin Ratio: 1.6 (ref 1.2–2.2)
Albumin: 4.1 g/dL (ref 3.8–4.8)
Alkaline Phosphatase: 138 IU/L — ABNORMAL HIGH (ref 44–121)
BUN/Creatinine Ratio: 14 (ref 12–28)
BUN: 12 mg/dL (ref 8–27)
Bilirubin Total: 1.1 mg/dL (ref 0.0–1.2)
CO2: 23 mmol/L (ref 20–29)
Calcium: 9.4 mg/dL (ref 8.7–10.3)
Chloride: 104 mmol/L (ref 96–106)
Creatinine, Ser: 0.87 mg/dL (ref 0.57–1.00)
Globulin, Total: 2.6 g/dL (ref 1.5–4.5)
Glucose: 101 mg/dL — ABNORMAL HIGH (ref 70–99)
Potassium: 3.4 mmol/L — ABNORMAL LOW (ref 3.5–5.2)
Sodium: 142 mmol/L (ref 134–144)
Total Protein: 6.7 g/dL (ref 6.0–8.5)
eGFR: 68 mL/min/{1.73_m2} (ref >59)

## 2022-06-22 LAB — PRO B NATRIURETIC PEPTIDE: NT-Pro BNP: 907 pg/mL — ABNORMAL HIGH (ref 0–738)

## 2022-06-22 LAB — CBC WITH DIFFERENTIAL/PLATELET
Basophils Absolute: 0 10*3/uL (ref 0.0–0.2)
Basos: 0 %
EOS (ABSOLUTE): 0.2 10*3/uL (ref 0.0–0.4)
Eos: 5 %
Hematocrit: 37.6 % (ref 34.0–46.6)
Hemoglobin: 12.2 g/dL (ref 11.1–15.9)
Immature Grans (Abs): 0 10*3/uL (ref 0.0–0.1)
Immature Granulocytes: 0 %
Lymphocytes Absolute: 0.6 10*3/uL — ABNORMAL LOW (ref 0.7–3.1)
Lymphs: 13 %
MCH: 28.1 pg (ref 26.6–33.0)
MCHC: 32.4 g/dL (ref 31.5–35.7)
MCV: 87 fL (ref 79–97)
Monocytes Absolute: 0.4 10*3/uL (ref 0.1–0.9)
Monocytes: 9 %
Neutrophils Absolute: 3.4 10*3/uL (ref 1.4–7.0)
Neutrophils: 73 %
Platelets: 179 10*3/uL (ref 150–450)
RBC: 4.34 x10E6/uL (ref 3.77–5.28)
RDW: 13.9 % (ref 11.7–15.4)
WBC: 4.6 10*3/uL (ref 3.4–10.8)

## 2022-06-22 LAB — HEMOGLOBIN A1C
Est. average glucose Bld gHb Est-mCnc: 131 mg/dL
Hgb A1c MFr Bld: 6.2 % — ABNORMAL HIGH (ref 4.8–5.6)

## 2022-06-22 LAB — CARDIOVASCULAR RISK ASSESSMENT

## 2022-06-24 NOTE — Progress Notes (Signed)
Blood count normal.  Liver function normal.  Kidney function normal.  Potassium very slightly low.  Cholesterol: at goal HBA1C: 6.2. Diabetes well controlled.  Pro BNP elevated. I changed her lasix to torsemide at her most recent appt. Ask if her shortness of breath has improved.  Add potassium chliride 20 meq once daily.

## 2022-06-25 ENCOUNTER — Encounter: Payer: Self-pay | Admitting: Family Medicine

## 2022-06-25 DIAGNOSIS — R0602 Shortness of breath: Secondary | ICD-10-CM | POA: Insufficient documentation

## 2022-06-25 NOTE — Assessment & Plan Note (Signed)
Change lasix to torsemide 20 mg daily.

## 2022-06-25 NOTE — Assessment & Plan Note (Signed)
Change lasix to torsemide 20 mg daily.  Check labs.

## 2022-06-25 NOTE — Assessment & Plan Note (Signed)
Well controlled.  No changes to medicines. Continue with Lopressor 25 mg Twice a day, IMDUR 30 mg every day. Continue to work on eating a healthy diet and exercise.  Labs drawn today.

## 2022-06-25 NOTE — Assessment & Plan Note (Signed)
Control: Well controlled. Recommend check sugars fasting daily. Recommend check feet daily. Recommend annual eye exams. Medicines: none. Continue to work on eating a healthy diet and exercise.  Labs drawn today.

## 2022-06-25 NOTE — Assessment & Plan Note (Signed)
Well controlled.  No changes to medicines. Continue with Atorvastatin 20 mg daily. Continue to work on eating a healthy diet and exercise.  Labs drawn today.

## 2022-06-26 ENCOUNTER — Other Ambulatory Visit: Payer: Self-pay

## 2022-06-26 MED ORDER — POTASSIUM CHLORIDE CRYS ER 20 MEQ PO TBCR: 20.0000 meq | EXTENDED_RELEASE_TABLET | Freq: Every day | ORAL | 1 refills | Status: DC

## 2022-07-02 ENCOUNTER — Other Ambulatory Visit: Payer: Self-pay

## 2022-07-02 ENCOUNTER — Telehealth: Payer: Self-pay | Admitting: Cardiology

## 2022-07-02 DIAGNOSIS — K219 Gastro-esophageal reflux disease without esophagitis: Secondary | ICD-10-CM

## 2022-07-02 MED ORDER — ISOSORBIDE MONONITRATE ER 30 MG PO TB24: 30.0000 mg | ORAL_TABLET | Freq: Every day | ORAL | 3 refills | Status: DC

## 2022-07-02 MED ORDER — METOPROLOL TARTRATE 25 MG PO TABS: ORAL_TABLET | ORAL | 3 refills | Status: DC

## 2022-07-02 MED ORDER — OMEPRAZOLE 20 MG PO CPDR: 20.0000 mg | DELAYED_RELEASE_CAPSULE | Freq: Every day | ORAL | 1 refills | Status: DC

## 2022-07-02 MED ORDER — APIXABAN 5 MG PO TABS: 5.0000 mg | ORAL_TABLET | Freq: Two times a day (BID) | ORAL | 2 refills | Status: DC

## 2022-07-02 MED ORDER — ATORVASTATIN CALCIUM 20 MG PO TABS: ORAL_TABLET | ORAL | 1 refills | Status: DC

## 2022-07-02 NOTE — Telephone Encounter (Signed)
*  STAT* If patient is at the pharmacy, call can be transferred to refill team.   1. Which medications need to be refilled? (please list name of each medication and dose if known)   isosorbide mononitrate (IMDUR) 30 MG 24 hr tablet    metoprolol tartrate (LOPRESSOR) 25 MG tablet    2. Which pharmacy/location (including street and city if local pharmacy) is medication to be sent to? [id number] [place]  3. Do they need a 30 day or 90 day supply?  90 day

## 2022-07-02 NOTE — Telephone Encounter (Signed)
Called patient and informed her that her Imdur and Lopressor medications had been re-filled and sent to her pharmacy. Patient was appreciative for the call and had no further questions at this time.

## 2022-07-12 DIAGNOSIS — H43813 Vitreous degeneration, bilateral: Secondary | ICD-10-CM | POA: Diagnosis not present

## 2022-07-12 DIAGNOSIS — H353121 Nonexudative age-related macular degeneration, left eye, early dry stage: Secondary | ICD-10-CM | POA: Diagnosis not present

## 2022-07-12 DIAGNOSIS — H353212 Exudative age-related macular degeneration, right eye, with inactive choroidal neovascularization: Secondary | ICD-10-CM | POA: Diagnosis not present

## 2022-07-17 ENCOUNTER — Telehealth: Payer: Self-pay

## 2022-07-17 NOTE — Progress Notes (Signed)
Care Management & Coordination Services Pharmacy Team  Reason for Encounter: Hypertension  Contacted patient to discuss hypertension disease state.  Current antihypertensive regimen:  Isosorbide Mononitrate '30mg'$  daily  Torsemide '20mg'$  daily prn Metoprolol Tartrate '25mg'$  twice daily  Eliquis '5mg'$  two times daily  Patient verbally confirms she is taking the above medications as directed. Yes  How often are you checking your Blood Pressure? Infrequently  she checks her blood pressure in the morning before taking her medication.  Current home BP readings: 07/17/22 127/76 89, 06/21/22 122/82 98  Wrist or arm cuff: Wrist  Any readings above 180/120? No  What recent interventions/DTPs have been made by any provider to improve Blood Pressure control since last CPP Visit: No changes   Any recent hospitalizations or ED visits since last visit with CPP? No  What diet changes have been made to improve Blood Pressure Control?  Pt is eating more than she was and is getting appetite back   What exercise is being done to improve your Blood Pressure Control?  Pt walks around house and does household work  Adherence Review: Is the patient currently on ACE/ARB medication? No Does the patient have >5 day gap between last estimated fill dates? No  Star Rating Drugs:  Medication:  Last Fill: Day Supply None noted  Chart Updates: Recent office visits:  06/21/22 Rochel Brome MD. Seen for SOB. Started on Torsemide '20mg'$  daily. D/C Benzonatate '200mg'$  and Furosemide '40mg'$ .   Recent consult visits:  None  Hospital visits:  None  Medications: Outpatient Encounter Medications as of 07/17/2022  Medication Sig   apixaban (ELIQUIS) 5 MG TABS tablet Take 1 tablet (5 mg total) by mouth 2 (two) times daily.   Ascorbic Acid (VITAMIN C) 100 MG tablet Take 100 mg by mouth daily.   atorvastatin (LIPITOR) 20 MG tablet Take one tablet by mouth daily   calcium carbonate (OSCAL) 1500 (600 Ca) MG TABS tablet  Take 600 mg of elemental calcium by mouth daily.   Cholecalciferol (VITAMIN D3 PO) Take 2 capsules by mouth daily.   isosorbide mononitrate (IMDUR) 30 MG 24 hr tablet Take 1 tablet (30 mg total) by mouth daily.   loratadine (CLARITIN) 10 MG tablet Take 1 tablet (10 mg total) by mouth daily as needed for allergies.   metoprolol tartrate (LOPRESSOR) 25 MG tablet TAKE 1 TABLET(25 MG) BY MOUTH TWICE DAILY   Multiple Vitamin (MULTIVITAMIN WITH MINERALS) TABS tablet Take 1 tablet by mouth daily.   Multiple Vitamins-Minerals (OCUVITE PRESERVISION PO) Take 1 tablet by mouth in the morning and at bedtime.   omeprazole (PRILOSEC) 20 MG capsule Take 1 capsule (20 mg total) by mouth daily.   OVER THE COUNTER MEDICATION Take 1 tablet by mouth daily. Circulation vitamin from QVC -   potassium chloride SA (KLOR-CON M) 20 MEQ tablet Take 1 tablet (20 mEq total) by mouth daily.   torsemide (DEMADEX) 20 MG tablet Take 1 tablet (20 mg total) by mouth daily.   triamcinolone (NASACORT) 55 MCG/ACT AERO nasal inhaler Place 1 spray into the nose daily as needed for allergies (allergies).   vitamin E 200 UNIT capsule Take 200 Units by mouth daily.   No facility-administered encounter medications on file as of 07/17/2022.    Recent Office Vitals: BP Readings from Last 3 Encounters:  06/21/22 122/82  05/15/22 130/86  04/09/22 122/70   Pulse Readings from Last 3 Encounters:  06/21/22 98  05/15/22 68  04/09/22 76    Wt Readings from Last 3 Encounters:  06/21/22 252 lb (114.3 kg)  05/15/22 246 lb (111.6 kg)  04/09/22 244 lb 12.8 oz (111 kg)     Kidney Function Lab Results  Component Value Date/Time   CREATININE 0.87 06/21/2022 11:46 AM   CREATININE 0.86 02/18/2022 10:42 AM   GFRNONAA 58 (L) 02/10/2020 09:57 AM   GFRAA 67 02/10/2020 09:57 AM       Latest Ref Rng & Units 06/21/2022   11:46 AM 02/18/2022   10:42 AM 08/21/2021   11:12 AM  BMP  Glucose 70 - 99 mg/dL 101  107  114   BUN 8 - 27 mg/dL '12   21  18   '$ Creatinine 0.57 - 1.00 mg/dL 0.87  0.86  0.85   BUN/Creat Ratio 12 - '28 14  24  21   '$ Sodium 134 - 144 mmol/L 142  140  140   Potassium 3.5 - 5.2 mmol/L 3.4  3.9  3.8   Chloride 96 - 106 mmol/L 104  103  104   CO2 20 - 29 mmol/L '23  22  23   '$ Calcium 8.7 - 10.3 mg/dL 9.4  9.5  9.3       [name], [place] Pharmacist Assistant  [phone]

## 2022-07-19 ENCOUNTER — Other Ambulatory Visit: Payer: Self-pay | Admitting: Family Medicine

## 2022-08-16 ENCOUNTER — Telehealth: Payer: Self-pay

## 2022-08-16 NOTE — Progress Notes (Unsigned)
Care Management & Coordination Services Pharmacy Team  Reason for Encounter: Hypertension  Contacted patient to discuss hypertension disease state. {US HC Outreach:28874}    Current antihypertensive regimen:  Isosorbide Mononitrate '30mg'$  daily  Torsemide '20mg'$  daily prn Metoprolol Tartrate '25mg'$  twice daily  Eliquis '5mg'$  two times daily  Patient verbally confirms she is taking the above medications as directed. {yes/no:20286}  How often are you checking your Blood Pressure? {CHL HP BP Monitoring [phone]}  she checks her blood pressure {timing:25218} {before/after:25217} taking her medication.  Current home BP readings: ***  DATE:             BP               PULSE   Wrist or arm cuff: Wrist  Caffeine intake: Salt intake: OTC medications including pseudoephedrine or NSAIDs?  Any readings above 180/100? {yes/no:20286}  What recent interventions/DTPs have been made by any provider to improve Blood Pressure control since last CPP Visit: ***  Any recent hospitalizations or ED visits since last visit with CPP? {yes/no:20286}  What diet changes have been made to improve Blood Pressure Control?  ***  What exercise is being done to improve your Blood Pressure Control?  ***  Adherence Review: Is the patient currently on ACE/ARB medication? No Does the patient have >5 day gap between last estimated fill dates? {yes/no:20286}  Star Rating Drugs:  Medication:  Last Fill: Day Supply None noted  Chart Updates: Recent office visits:  None  Recent consult visits:  None  Hospital visits:  None  Medications: Outpatient Encounter Medications as of 08/16/2022  Medication Sig   apixaban (ELIQUIS) 5 MG TABS tablet Take 1 tablet (5 mg total) by mouth 2 (two) times daily.   Ascorbic Acid (VITAMIN C) 100 MG tablet Take 100 mg by mouth daily.   atorvastatin (LIPITOR) 20 MG tablet Take one tablet by mouth daily   calcium carbonate (OSCAL) 1500 (600 Ca) MG TABS tablet  Take 600 mg of elemental calcium by mouth daily.   Cholecalciferol (VITAMIN D3 PO) Take 2 capsules by mouth daily.   isosorbide mononitrate (IMDUR) 30 MG 24 hr tablet Take 1 tablet (30 mg total) by mouth daily.   loratadine (CLARITIN) 10 MG tablet Take 1 tablet (10 mg total) by mouth daily as needed for allergies.   metoprolol tartrate (LOPRESSOR) 25 MG tablet TAKE 1 TABLET(25 MG) BY MOUTH TWICE DAILY   Multiple Vitamin (MULTIVITAMIN WITH MINERALS) TABS tablet Take 1 tablet by mouth daily.   Multiple Vitamins-Minerals (OCUVITE PRESERVISION PO) Take 1 tablet by mouth in the morning and at bedtime.   omeprazole (PRILOSEC) 20 MG capsule Take 1 capsule (20 mg total) by mouth daily.   OVER THE COUNTER MEDICATION Take 1 tablet by mouth daily. Circulation vitamin from QVC -   potassium chloride SA (KLOR-CON M20) 20 MEQ tablet TAKE 1 TABLET BY MOUTH EVERY DAY   torsemide (DEMADEX) 20 MG tablet Take 1 tablet (20 mg total) by mouth daily.   triamcinolone (NASACORT) 55 MCG/ACT AERO nasal inhaler Place 1 spray into the nose daily as needed for allergies (allergies).   vitamin E 200 UNIT capsule Take 200 Units by mouth daily.   No facility-administered encounter medications on file as of 08/16/2022.    Recent Office Vitals: BP Readings from Last 3 Encounters:  06/21/22 122/82  05/15/22 130/86  04/09/22 122/70   Pulse Readings from Last 3 Encounters:  06/21/22 98  05/15/22 68  04/09/22 76    Wt Readings from  Last 3 Encounters:  06/21/22 252 lb (114.3 kg)  05/15/22 246 lb (111.6 kg)  04/09/22 244 lb 12.8 oz (111 kg)     Kidney Function Lab Results  Component Value Date/Time   CREATININE 0.87 06/21/2022 11:46 AM   CREATININE 0.86 02/18/2022 10:42 AM   GFRNONAA 58 (L) 02/10/2020 09:57 AM   GFRAA 67 02/10/2020 09:57 AM       Latest Ref Rng & Units 06/21/2022   11:46 AM 02/18/2022   10:42 AM 08/21/2021   11:12 AM  BMP  Glucose 70 - 99 mg/dL 101  107  114   BUN 8 - 27 mg/dL '12  21  18    '$ Creatinine 0.57 - 1.00 mg/dL 0.87  0.86  0.85   BUN/Creat Ratio 12 - '28 14  24  21   '$ Sodium 134 - 144 mmol/L 142  140  140   Potassium 3.5 - 5.2 mmol/L 3.4  3.9  3.8   Chloride 96 - 106 mmol/L 104  103  104   CO2 20 - 29 mmol/L '23  22  23   '$ Calcium 8.7 - 10.3 mg/dL 9.4  9.5  9.3      [name], [place] Pharmacist Assistant  [phone]

## 2022-08-26 ENCOUNTER — Ambulatory Visit: Payer: Medicare Other | Admitting: Physician Assistant

## 2022-09-05 ENCOUNTER — Telehealth: Payer: Self-pay

## 2022-09-05 NOTE — Progress Notes (Signed)
Care Management & Coordination Services Pharmacy Team  Reason for Encounter: Hypertension  Contacted patient to discuss hypertension disease state. Spoke with patient on 09/10/2022     Current antihypertensive regimen:  Isosorbide Mononitrate 30mg  daily  Torsemide 20mg  daily prn Metoprolol Tartrate 25mg  twice daily  Eliquis 5mg  two times daily  Patient verbally confirms she is taking the above medications as directed. Yes  How often are you checking your Blood Pressure? daily  she checks her blood pressure in the morning before taking her medication.  Current home BP readings: 09/09/22 116/67 105, 09/08/22 116/90 65, 09/07/22 117/73 81, 09/06/22 120/59 69, 09/05/22 116/62 90  Wrist or arm cuff: Wrist  Caffeine intake: Rarely drinks soda and no coffee Salt intake:Limited  OTC medications including pseudoephedrine or NSAIDs? None  Any readings above 180/100? No  What recent interventions/DTPs have been made by any provider to improve Blood Pressure control since last CPP Visit: No changes   Any recent hospitalizations or ED visits since last visit with CPP? No  What diet changes have been made to improve Blood Pressure Control?  No recent diet changes  What exercise is being done to improve your Blood Pressure Control?  Pt tries to walk around  Adherence Review: Is the patient currently on ACE/ARB medication? No Does the patient have >5 day gap between last estimated fill dates? No  Star Rating Drugs:  Medication:  Last Fill: Day Supply None noted  Chart Updates: Recent office visits:  None   Recent consult visits:  None  Hospital visits:  None  Medications: Outpatient Encounter Medications as of 09/05/2022  Medication Sig   apixaban (ELIQUIS) 5 MG TABS tablet Take 1 tablet (5 mg total) by mouth 2 (two) times daily.   Ascorbic Acid (VITAMIN C) 100 MG tablet Take 100 mg by mouth daily.   atorvastatin (LIPITOR) 20 MG tablet Take one tablet by mouth daily   calcium  carbonate (OSCAL) 1500 (600 Ca) MG TABS tablet Take 600 mg of elemental calcium by mouth daily.   Cholecalciferol (VITAMIN D3 PO) Take 2 capsules by mouth daily.   isosorbide mononitrate (IMDUR) 30 MG 24 hr tablet Take 1 tablet (30 mg total) by mouth daily.   loratadine (CLARITIN) 10 MG tablet Take 1 tablet (10 mg total) by mouth daily as needed for allergies.   metoprolol tartrate (LOPRESSOR) 25 MG tablet TAKE 1 TABLET(25 MG) BY MOUTH TWICE DAILY   Multiple Vitamin (MULTIVITAMIN WITH MINERALS) TABS tablet Take 1 tablet by mouth daily.   Multiple Vitamins-Minerals (OCUVITE PRESERVISION PO) Take 1 tablet by mouth in the morning and at bedtime.   omeprazole (PRILOSEC) 20 MG capsule Take 1 capsule (20 mg total) by mouth daily.   OVER THE COUNTER MEDICATION Take 1 tablet by mouth daily. Circulation vitamin from [facility] -   potassium chloride SA (KLOR-CON M20) 20 MEQ tablet TAKE 1 TABLET BY MOUTH EVERY DAY   torsemide (DEMADEX) 20 MG tablet Take 1 tablet (20 mg total) by mouth daily.   triamcinolone (NASACORT) 55 MCG/ACT AERO nasal inhaler Place 1 spray into the nose daily as needed for allergies (allergies).   vitamin E 200 UNIT capsule Take 200 Units by mouth daily.   No facility-administered encounter medications on file as of 09/05/2022.    Recent Office Vitals: BP Readings from Last 3 Encounters:  06/21/22 122/82  05/15/22 130/86  04/09/22 122/70   Pulse Readings from Last 3 Encounters:  06/21/22 98  05/15/22 68  04/09/22 76    Wt  Readings from Last 3 Encounters:  06/21/22 252 lb (114.3 kg)  05/15/22 246 lb (111.6 kg)  04/09/22 244 lb 12.8 oz (111 kg)     Kidney Function Lab Results  Component Value Date/Time   CREATININE 0.87 06/21/2022 11:46 AM   CREATININE 0.86 02/18/2022 10:42 AM   GFRNONAA 58 (L) 02/10/2020 09:57 AM   GFRAA 67 02/10/2020 09:57 AM       Latest Ref Rng & Units 06/21/2022   11:46 AM 02/18/2022   10:42 AM 08/21/2021   11:12 AM  BMP  Glucose 70 - 99 mg/dL  101  107  114   BUN 8 - 27 mg/dL 12  21  18    Creatinine 0.57 - 1.00 mg/dL 0.87  0.86  0.85   BUN/Creat Ratio 12 - 28 14  24  21    Sodium 134 - 144 mmol/L 142  140  140   Potassium 3.5 - 5.2 mmol/L 3.4  3.9  3.8   Chloride 96 - 106 mmol/L 104  103  104   CO2 20 - 29 mmol/L 23  22  23    Calcium 8.7 - 10.3 mg/dL 9.4  9.5  9.3      [name], [place] Pharmacist Assistant  [phone]

## 2022-09-26 ENCOUNTER — Telehealth: Payer: Self-pay

## 2022-09-26 NOTE — Progress Notes (Cosign Needed)
Patient brought in out of pocket expense report from [place], faxed to [facility] patient assistance using [id number] to see if this is needed for patient to receive Eliquis.   Pattricia Boss, [place] Pharmacist Assistant [phone]

## 2022-10-22 ENCOUNTER — Telehealth: Payer: Self-pay

## 2022-10-22 NOTE — Progress Notes (Signed)
Care Management & Coordination Services Pharmacy Team  Reason for Encounter: Appointment Reminder  Contacted patient to confirm telephone appointment with Artelia Laroche, PharmD on 10/24/22 at 10:00 am.  Spoke with patient on 10/22/2022   Do you have any problems getting your medications? Yes If yes what types of problems are you experiencing? Financial barriers, pt is still awaiting on her PAP application. She took her OOP report and it has been faxed to company on 09/26/22. I did give her Alver Fisher number to follow up.   What is your top health concern you would like to discuss at your upcoming visit? No top concerns   Have you seen any other providers since your last visit with PCP? No   Chart review:  Recent office visits:  None  Recent consult visits:  None  Hospital visits:  None   Star Rating Drugs:  Medication:  Last Fill: Day Supply Atorvastatin   09/29/22-07/02/22 90ds  Care Gaps: Annual wellness visit in last year? Yes  If Diabetic: Last eye exam / retinopathy screening: Never done Last diabetic foot exam:Never done   Roxana Hires, [facility] Clinical Pharmacist Assistant  [phone]

## 2022-10-24 ENCOUNTER — Ambulatory Visit: Payer: Medicare Other

## 2022-10-24 NOTE — Patient Outreach (Signed)
Care Management & Coordination Services Pharmacy Note  10/24/2022 Name:  Miranda Moses MRN:  [id number] DOB:  08-Jul-1940  Summary: -Pleasant 82 year old female presents for F/U CCM visit. She is widowed, husband passed in 2019. She has a daughter and a grandson who is in [place] that she goes out to eat for lunch with every day, she tells me it's the highlight of her day.   Recommendations/Changes made from today's visit: -Patient has no f/u scheduled with PCP. Will co-sign PCP to let them know so the PCP can schedule a f/u at the cadence they desire -Patient due for DEXA. Will co-sign PCP so they can get one set up for patient (She has one scheduled for last year but missed the appt) -Recommend patient call Alver Fisher for PAP update   Subjective: Miranda Moses is an 82 y.o. year old female who is a primary patient of Marianne Sofia, [place].  The care coordination team was consulted for assistance with disease management and care coordination needs.    Engaged with patient by telephone for follow up visit.   Recent office visits:  None   Recent consult visits:  None   Hospital visits:  None   Objective:  Lab Results  Component Value Date   CREATININE 0.87 06/21/2022   BUN 12 06/21/2022   EGFR 68 06/21/2022   GFRNONAA 58 (L) 02/10/2020   GFRAA 67 02/10/2020   NA 142 06/21/2022   K 3.4 (L) 06/21/2022   CALCIUM 9.4 06/21/2022   CO2 23 06/21/2022   GLUCOSE 101 (H) 06/21/2022    Lab Results  Component Value Date/Time   HGBA1C 6.2 (H) 06/21/2022 11:46 AM   HGBA1C 6.2 (H) 02/18/2022 10:42 AM    Last diabetic Eye exam: No results found for: "HMDIABEYEEXA"  Last diabetic Foot exam: No results found for: "HMDIABFOOTEX"   Lab Results  Component Value Date   CHOL 130 06/21/2022   HDL 49 06/21/2022   LDLCALC 66 06/21/2022   TRIG 78 06/21/2022   CHOLHDL 2.7 06/21/2022       Latest Ref Rng & Units 06/21/2022   11:46 AM 02/18/2022   10:42 AM 08/21/2021   11:12 AM  Hepatic Function   Total Protein 6.0 - 8.5 g/dL 6.7  6.7  6.5   Albumin 3.8 - 4.8 g/dL 4.1  4.1  3.8   AST 0 - 40 IU/L 14  17  16    ALT 0 - 32 IU/L 7  10  9    Alk Phosphatase 44 - 121 IU/L 138  132  137   Total Bilirubin 0.0 - 1.2 mg/dL 1.1  1.0  0.7     Lab Results  Component Value Date/Time   TSH 1.410 02/18/2022 10:42 AM   TSH 1.600 02/10/2020 09:57 AM       Latest Ref Rng & Units 06/21/2022   11:46 AM 02/18/2022   10:42 AM 08/21/2021   11:12 AM  CBC  WBC 3.4 - 10.8 x10E3/uL 4.6  3.5  4.0   Hemoglobin 11.1 - 15.9 g/dL 40.9  81.1  91.4   Hematocrit 34.0 - 46.6 % 37.6  40.4  38.1   Platelets 150 - 450 x10E3/uL 179  175  182     Lab Results  Component Value Date/Time   VD25OH 45.4 02/18/2022 10:42 AM   VD25OH 58.5 08/21/2021 11:12 AM    Clinical ASCVD: Yes  The ASCVD Risk score (Arnett DK, et al., 2019) failed to calculate for the following reasons:  The 2019 ASCVD risk score is only valid for ages 80 to 29    Other: (CHADS2VASc if Afib, MMRC or CAT for COPD, ACT, DEXA)     02/20/2022    3:04 PM 02/20/2022    3:01 PM 10/03/2020   10:05 AM  Depression screen PHQ 2/9  Decreased Interest 0 0 0  Down, Depressed, Hopeless 0 0 0  PHQ - 2 Score 0 0 0  Altered sleeping 0    Tired, decreased energy 0    Change in appetite 0    Feeling bad or failure about yourself  0    Trouble concentrating 0    Moving slowly or fidgety/restless 0    Suicidal thoughts 0    PHQ-9 Score 0    Difficult doing work/chores Not difficult at all       Social History   Tobacco Use  Smoking Status Never  Smokeless Tobacco Never   BP Readings from Last 3 Encounters:  06/21/22 122/82  05/15/22 130/86  04/09/22 122/70   Pulse Readings from Last 3 Encounters:  06/21/22 98  05/15/22 68  04/09/22 76   Wt Readings from Last 3 Encounters:  06/21/22 252 lb (114.3 kg)  05/15/22 246 lb (111.6 kg)  04/09/22 244 lb 12.8 oz (111 kg)   BMI Readings from Last 3 Encounters:  06/21/22 41.30 kg/m  05/15/22  40.31 kg/m  04/09/22 40.74 kg/m    Allergies  Allergen Reactions   Clarithromycin     Numbness of extremities   Codeine Other (See Comments)    Caused pain   Penicillins Rash    Did it involve swelling of the face/tongue/throat, SOB, or low BP? No Did it involve sudden or severe rash/hives, skin peeling, or any reaction on the inside of your mouth or nose? Yes Did you need to seek medical attention at a hospital or doctor's office? Yes When did it last happen?      20 years If all above answers are "NO", may proceed with cephalosporin use.     Medications Reviewed Today     Reviewed by Blane Ohara, MD (Physician) on 06/25/22 at 1324  Med List Status: <None>   Medication Order Taking? Sig Documenting Provider Last Dose Status Informant  apixaban (ELIQUIS) 5 MG TABS tablet [id number] Yes Take 1 tablet (5 mg total) by mouth 2 (two) times daily. Marianne Sofia, PA-C Taking Active   Ascorbic Acid (VITAMIN C) 100 MG tablet [id number] Yes Take 100 mg by mouth daily. [provider] Taking Active   atorvastatin (LIPITOR) 20 MG tablet [id number] Yes TAKE 1 TABLET(20 MG) BY MOUTH EVERY DAY Marianne Sofia, PA-C Taking Active   calcium carbonate (OSCAL) 1500 (600 Ca) MG TABS tablet [id number] Yes Take 600 mg of elemental calcium by mouth daily. [provider] Taking Active   Cholecalciferol (VITAMIN D3 PO) [id number] Yes Take 2 capsules by mouth daily. [provider] Taking Active Self  isosorbide mononitrate (IMDUR) 30 MG 24 hr tablet [id number] Yes TAKE 1 TABLET BY MOUTH EVERY DAY Dulce Sellar, Iline Oven, MD Taking Active   loratadine (CLARITIN) 10 MG tablet [id number] Yes Take 1 tablet (10 mg total) by mouth daily as needed for allergies. Marianne Sofia, PA-C Taking Active   metoprolol tartrate (LOPRESSOR) 25 MG tablet [id number] Yes TAKE 1 TABLET(25 MG) BY MOUTH TWICE DAILY Dulce Sellar Iline Oven, MD Taking Active   Multiple Vitamin (MULTIVITAMIN WITH MINERALS) TABS tablet [id number] Yes Take  1 tablet by mouth daily. [provider] Taking Active  Self  Multiple Vitamins-Minerals (OCUVITE PRESERVISION PO) [id number] Yes Take 1 tablet by mouth in the morning and at bedtime. [provider] Taking Active   omeprazole (PRILOSEC) 20 MG capsule [id number] Yes Take 1 capsule (20 mg total) by mouth daily. Marianne Sofia, PA-C Taking Active   OVER THE COUNTER MEDICATION [id number] Yes Take 1 tablet by mouth daily. Circulation vitamin from [facility] - [provider] Taking Active   torsemide (DEMADEX) 20 MG tablet [id number] Yes Take 1 tablet (20 mg total) by mouth daily. Cox, Kirsten, MD  Active   triamcinolone (NASACORT) 55 MCG/ACT AERO nasal inhaler [id number] Yes Place 1 spray into the nose daily as needed for allergies (allergies). [provider] Taking Active Self  vitamin E 200 UNIT capsule [id number] Yes Take 200 Units by mouth daily. [provider] Taking Active             SDOH:  (Social Determinants of Health) assessments and interventions performed: Yes SDOH Interventions    Flowsheet Row Care Coordination from 10/24/2022 in [facility] Chronic Care Management from 04/25/2022 in [facility] Clinical Support from 02/20/2022 in [facility] Chronic Care Management from 08/14/2021 in [facility]  SDOH Interventions      Food Insecurity Interventions -- -- Intervention Not Indicated --  Housing Interventions -- -- Intervention Not Indicated --  Transportation Interventions Intervention Not Indicated Intervention Not Indicated Intervention Not Indicated Intervention Not Indicated  Financial Strain Interventions Other (Comment)  [PAP for Eliquis] Other (Comment)  [Eliquis PAP] Intervention Not Indicated Intervention Not Indicated  Physical Activity Interventions -- -- Intervention Not Indicated --  Stress Interventions -- -- Intervention Not Indicated --       Medication  Assistance:   Eliquis -2024: PAP Completed but patient hasn't called to check on status of approval   Name and location of Current pharmacy:  [id number] [place], [place] - [address] [place] [address] Phone: [phone] Fax: [phone]   Compliance/Adherence/Medication fill history: Star Rating Drugs:  Medication:                Last Fill:         Day Supply Atorvastatin                 09/29/22-07/02/22 90ds   Care Gaps: Annual wellness visit in last year? Yes   Assessment/Plan   Hyperlipidemia: (LDL goal < 55) The ASCVD Risk score (Arnett DK, et al., 2019) failed to calculate for the following reasons:   The 2019 ASCVD risk score is only valid for ages 39 to 79 Lab Results  Component Value Date   CHOL 130 06/21/2022   CHOL 157 02/18/2022   CHOL 137 08/21/2021   Lab Results  Component Value Date   HDL 49 06/21/2022   HDL 56 02/18/2022   HDL 44 08/21/2021   Lab Results  Component Value Date   LDLCALC 66 06/21/2022   LDLCALC 81 02/18/2022   LDLCALC 74 08/21/2021   Lab Results  Component Value Date   TRIG 78 06/21/2022   TRIG 110 02/18/2022   TRIG 101 08/21/2021   Lab Results  Component Value Date   CHOLHDL 2.7 06/21/2022   CHOLHDL 2.8 02/18/2022   CHOLHDL 3.1 08/21/2021   No results found for: "LDLDIRECT" Last vitamin D Lab Results  Component Value Date   VD25OH 45.4 02/18/2022   Lab Results  Component Value Date   TSH  1.410 02/18/2022   -Not ideally controlled -Current treatment: Atorvastatin 20 mg daily Appropriate, Effective, Safe, Accessible -Medications previously tried: none reported  -Current dietary patterns: avoids salt. Eats out. Doesn't have a huge appetite. Likes fish and cheeseburgers from restaurants.  -Current exercise habits: no regular.  -Educated on Cholesterol goals;  Benefits of statin for ASCVD risk reduction; Importance of limiting foods high in cholesterol; Exercise goal of 150 minutes per  week; -Counseled on diet and exercise extensively Plan: At goal,  patient stable/ symptoms controlled     Diabetes (A1c goal <7%) Lab Results  Component Value Date   HGBA1C 6.2 (H) 06/21/2022   HGBA1C 6.2 (H) 02/18/2022   HGBA1C 6.2 (H) 08/21/2021   Lab Results  Component Value Date   LDLCALC 66 06/21/2022   CREATININE 0.87 06/21/2022    Lab Results  Component Value Date   NA 142 06/21/2022   K 3.4 (L) 06/21/2022   CREATININE 0.87 06/21/2022   EGFR 68 06/21/2022   GFRNONAA 58 (L) 02/10/2020   GLUCOSE 101 (H) 06/21/2022    Lab Results  Component Value Date   WBC 4.6 06/21/2022   HGB 12.2 06/21/2022   HCT 37.6 06/21/2022   MCV 87 06/21/2022   PLT 179 06/21/2022    No results found for: "LABMICR", "MICROALBUR" -Controlled -Current medications: Diet and lifestyle  -Medications previously tried: none  -Current home glucose readings fasting glucose: doesn't check post prandial glucose: doesn't check -Denies hypoglycemic/hyperglycemic symptoms -Current meal patterns:  breakfast: eggs  Lunch and dinner: enjoys grabbing a hamburger with her grandson   -Current exercise: no regular exercise  -Educated onA1c and blood sugar goals; Exercise goal of 150 minutes per week; Carbohydrate counting and/or plate method -Counseled to check feet daily and get yearly eye exams -Counseled on diet and exercise extensively   Atrial Fibrillation (Goal: prevent stroke and major bleeding) -Controlled -CHADSVASC: 7 -Current treatment: Rate control:  Metoprolol tartrate 25 mg bid Appropriate, Effective, Safe, Accessible Anticoagulation:  Eliquis 5 mg bid Appropriate, Effective, Safe, Query accessible 2024: PAP sent in, waiting on patient to call to check on status as of May 82 y.o. Lab Results  Component Value Date   CREATININE 0.87 06/21/2022   Wt Readings from Last 3 Encounters:  06/21/22 252 lb (114.3 kg)  05/15/22 246 lb (111.6 kg)  04/09/22 244 lb 12.8 oz (111 kg)    -Medications previously tried: none reported -Home BP and HR readings:  October 2022: 04/17/21: 112/70 November 2023: 04/25/22: 116/68 -Counseled on increased risk of stroke due to Afib and benefits of anticoagulation for stroke prevention; importance of adherence to anticoagulant exactly as prescribed; avoidance of NSAIDs due to increased bleeding risk with anticoagulants; -Recommended to continue current medication April 2022: Collaborated with [facility] to fill Eliquis. Patient aware that copay and deductible will be $522 this year. We will be able to complete patient assistance at that time due to meting 3% out of pocket requirement. Patient will stop in office to sign completed Eliquis form and bring proof of social security income and out of pocket cost on medications for 2022. Pharmacist faxed prior authorization to Dr. Hulen Shouts office for patient.  Prior authorization approved 08/28/2020. Pharmacist updated patient via phone and requested fill of medication through [facility].  Feb 2023: Patient states meds are going well and no issues. Will update PAP in December November 2023: Renew PAP   Heart Failure (Goal: manage symptoms and prevent exacerbations) -Controlled -Last ejection fraction: 60-65%  (Date: 09/2018) -HF type: Hypertensive  Heart disease with heart failure -NYHA Class: I (no actitivty limitation) -Current treatment: Torsemide 20mg  Appropriate, Effective, Safe, Accessible Isosorbide mn 30 mg daily Appropriate, Effective, Safe, Accessible Metoprolol tartrate 25 mg bid Appropriate, Effective, Safe, Accessible -Medications previously tried: none reported  -Current home BP/HR readings: in frequently checking but 110/55 and 107/63 mmHg -Current dietary habits: eats out several times each week. Doesn't salt food. Minimal appetite.  -Current exercise habits: no regular exercise -Educated on Benefits of medications for managing symptoms and prolonging life Importance of  weighing daily; if you gain more than 3 pounds in one day or 5 pounds in one week, contact provider Importance of blood pressure control -Counseled on diet and exercise extensively       Osteoporosis / Osteopenia (Goal minimize risk of fractures/breaks) -Controlled -Last DEXA Scan: 09/21              T-Score femoral neck: -1.4             T-Score forearm radius: -1.8             10-year probability of major osteoporotic fracture: 17.7             10-year probability of hip fracture: 8.8 -Patient is a candidate for pharmacologic treatment due to T-Score -1.0 to -2.5 and 10-year risk of hip fracture > 3% -Current treatment  Vitamin D daily Appropriate, Effective, Safe, Accessible Calcium 600 mg daily Appropriate, Effective, Safe, Accessible -Medications previously tried: none reported  -Recommend [phone] units of vitamin D daily. Recommend 1200 mg of calcium daily from dietary and supplemental sources. Recommend weight-bearing and muscle strengthening exercises for building and maintaining bone density. October 2023: Due for Dexa. Recommend Calcium Citrate May 2024: Recommend DEXA    CP F/U November 2024  Artelia Laroche, [place].D. - [phone]

## 2022-11-07 ENCOUNTER — Telehealth: Payer: Self-pay

## 2022-11-07 NOTE — Progress Notes (Signed)
Received notification from Alver Fisher that patient enrollment ended 06/23/2022 and if she wanted to reapply an application was attached. Called patient to see if she received an application also or if I need to mail her this one. No answer, left message to return call.   Billee Cashing, CMA Clinical Pharmacist Assistant [phone]

## 2022-11-14 ENCOUNTER — Other Ambulatory Visit: Payer: Self-pay

## 2022-11-14 ENCOUNTER — Telehealth: Payer: Self-pay

## 2022-11-14 DIAGNOSIS — R0602 Shortness of breath: Secondary | ICD-10-CM

## 2022-11-14 MED ORDER — TORSEMIDE 20 MG PO TABS: 20.0000 mg | ORAL_TABLET | Freq: Every day | ORAL | 0 refills | Status: DC

## 2022-11-14 NOTE — Progress Notes (Signed)
Patient brought in [place] patient assitance application for Eliquis with income and insurance  cards. Provider signature obtained and application was faxed to [facility]    Billee Cashing, CMA Clinical Pharmacist Assistant [phone]

## 2022-11-14 NOTE — Telephone Encounter (Signed)
Billee Cashing E  P [facility] Clinical Good afternoon,  Mrs Mechling needs a prescription for Torsemide [facility] [place] please.  Thank you   Billee Cashing, [facility] Clinical Pharmacist Assistant [phone]

## 2022-11-20 ENCOUNTER — Ambulatory Visit (INDEPENDENT_AMBULATORY_CARE_PROVIDER_SITE_OTHER): Payer: Medicare Other | Admitting: Physician Assistant

## 2022-11-20 ENCOUNTER — Encounter: Payer: Self-pay | Admitting: Physician Assistant

## 2022-11-20 VITALS — BP 112/70 | HR 88 | Temp 97.1°F | Ht 65.5 in | Wt 244.0 lb

## 2022-11-20 DIAGNOSIS — E782 Mixed hyperlipidemia: Secondary | ICD-10-CM | POA: Diagnosis not present

## 2022-11-20 DIAGNOSIS — I5032 Chronic diastolic (congestive) heart failure: Secondary | ICD-10-CM

## 2022-11-20 DIAGNOSIS — E119 Type 2 diabetes mellitus without complications: Secondary | ICD-10-CM

## 2022-11-20 DIAGNOSIS — E559 Vitamin D deficiency, unspecified: Secondary | ICD-10-CM

## 2022-11-20 DIAGNOSIS — K219 Gastro-esophageal reflux disease without esophagitis: Secondary | ICD-10-CM | POA: Diagnosis not present

## 2022-11-20 DIAGNOSIS — I4811 Longstanding persistent atrial fibrillation: Secondary | ICD-10-CM | POA: Diagnosis not present

## 2022-11-20 DIAGNOSIS — D492 Neoplasm of unspecified behavior of bone, soft tissue, and skin: Secondary | ICD-10-CM | POA: Diagnosis not present

## 2022-11-20 DIAGNOSIS — I110 Hypertensive heart disease with heart failure: Secondary | ICD-10-CM | POA: Diagnosis not present

## 2022-11-20 DIAGNOSIS — I739 Peripheral vascular disease, unspecified: Secondary | ICD-10-CM

## 2022-11-20 NOTE — Progress Notes (Signed)
Established Patient Office Visit  Subjective:  Patient ID: Miranda Moses, female    DOB: May 04, 1941  Age: 82 y.o. MRN: [id number]  CC:  Chief Complaint  Patient presents with   Medical Management of Chronic Issues    HPI Miranda Moses presents for chronic follow up hyperlipidemia  Mixed hyperlipidemia  Pt presents with hyperlipidemia.  Compliance with treatment has been good; The patient is compliant with medications, maintains a low cholesterol diet , follows up as directed ,  . The patient denies experiencing any hypercholesterolemia related symptoms. . Pt currently on atorvastatin 20mg  qd  Pt presents for follow up of hypertension.  The patient is tolerating the medication well without side effects. Compliance with treatment has been good; including taking medication as directed , maintains a healthy diet and regular exercise regimen , and following up as directed.  Pt currently on metoprolol 25mg  bid and also takes toresemide 20mg   Pt has a history of chronic diastolic CHF and CAD- she is trying to do a low sodium diet and currently on torsemide 20mg  and potassium supplements -  She is also on imdur 30mg  qd Pt with history of afib - is currently following with cardiology- she is currently on Eliquis 5mg  bid (also has history of PE in 07/2019) she follows with Dr Dulce Sellar and has a follow up appt with him next week  Pt with history of GERD - doing well on omeprazole 20mg   Pt has lesion on the back of her left leg that stays scabbed over and inflamed.  Has gotten larger recently - would like referral to dermatology  Pt with history of diabetes - she has never been on medication and recent hgb a1cs have been under 7.0 - she denies any problems Past Medical History:  Diagnosis Date   Angina pectoris (HCC) 02/18/2018   CAD in native artery    by CT coronary 10/2018 >> medical therapy    CHF (congestive heart failure), NYHA class I, chronic, diastolic (HCC) 08/09/2019   Chronic diastolic  (congestive) heart failure (HCC)    Coronary artery disease of native artery of native heart with stable angina pectoris (HCC) 08/09/2019   COVID-19 virus infection    Encounter for vaccination 10/03/2020   Family history of coronary artery disease 01/30/2018   Gastro-esophageal reflux disease without esophagitis 01/30/2018   GERD (gastroesophageal reflux disease)    Health maintenance examination 10/03/2020   History of pulmonary embolism 08/23/2019   Hyperlipidemia    Hypertension    Hypertensive heart disease with heart failure (HCC) 01/30/2018   Mixed hyperlipidemia 02/18/2018   New onset atrial fibrillation (HCC)    Other fatigue 02/10/2020   Other specified menopausal and perimenopausal disorders 02/10/2020   PAD (peripheral artery disease) (HCC) 08/28/2018   PAH (pulmonary artery hypertension) (HCC) 08/28/2018   Pre-diabetes    Prediabetes 02/10/2020   Pulmonary embolus (HCC)    Pulmonary hypertension (HCC)    Vitamin D deficiency 08/09/2019    Past Surgical History:  Procedure Laterality Date   ABDOMINAL HYSTERECTOMY     CATARACT EXTRACTION Left 03/2014   CATARACT EXTRACTION Right 05/2014   COLONOSCOPY  11/05/2013    Family History  Problem Relation Age of Onset   Heart disease Father     Social History   Socioeconomic History   Marital status: Widowed    Spouse name: Not on file   Number of children: 1   Years of education: Not on file   Highest education level: Not on file  Occupational History   Occupation: retired  Tobacco Use   Smoking status: Never   Smokeless tobacco: Never  Vaping Use   Vaping Use: Never used  Substance and Sexual Activity   Alcohol use: Never   Drug use: Never   Sexual activity: Not on file  Other Topics Concern   Not on file  Social History Narrative   Not on file   Social Determinants of Health   Financial Resource Strain: High Risk (10/24/2022)   Overall Financial Resource Strain (CARDIA)    Difficulty of Paying Living Expenses: Hard   Food Insecurity: No Food Insecurity (02/20/2022)   Hunger Vital Sign    Worried About Running Out of Food in the Last Year: Never true    Ran Out of Food in the Last Year: Never true  Transportation Needs: No Transportation Needs (10/24/2022)   PRAPARE - Administrator, Civil Service (Medical): No    Lack of Transportation (Non-Medical): No  Physical Activity: Inactive (02/20/2022)   Exercise Vital Sign    Days of Exercise per Week: 0 days    Minutes of Exercise per Session: 0 min  Stress: No Stress Concern Present (02/20/2022)   [employer] of Occupational Health - Occupational Stress Questionnaire    Feeling of Stress : Only a little  Social Connections: Moderately Isolated (02/20/2022)   Social Connection and Isolation Panel [NHANES]    Frequency of Communication with Friends and Family: More than three times a week    Frequency of Social Gatherings with Friends and Family: More than three times a week    Attends Religious Services: 1 to 4 times per year    Active Member of [employer] or Organizations: No    Attends Banker Meetings: Never    Marital Status: Widowed  Intimate Partner Violence: Not At Risk (02/20/2022)   Humiliation, Afraid, Rape, and Kick questionnaire    Fear of Current or Ex-Partner: No    Emotionally Abused: No    Physically Abused: No    Sexually Abused: No     Current Outpatient Medications:    apixaban (ELIQUIS) 5 MG TABS tablet, Take 1 tablet (5 mg total) by mouth 2 (two) times daily., Disp: 60 tablet, Rfl: 2   Ascorbic Acid (VITAMIN C) 100 MG tablet, Take 100 mg by mouth daily., Disp: , Rfl:    atorvastatin (LIPITOR) 20 MG tablet, Take one tablet by mouth daily, Disp: 90 tablet, Rfl: 1   calcium carbonate (OSCAL) 1500 (600 Ca) MG TABS tablet, Take 600 mg of elemental calcium by mouth daily., Disp: , Rfl:    Cholecalciferol (VITAMIN D3 PO), Take 2 capsules by mouth daily., Disp: , Rfl:    isosorbide mononitrate (IMDUR) 30 MG 24 hr  tablet, Take 1 tablet (30 mg total) by mouth daily., Disp: 90 tablet, Rfl: 3   loratadine (CLARITIN) 10 MG tablet, Take 1 tablet (10 mg total) by mouth daily as needed for allergies., Disp: 90 tablet, Rfl: 0   metoprolol tartrate (LOPRESSOR) 25 MG tablet, TAKE 1 TABLET(25 MG) BY MOUTH TWICE DAILY, Disp: 180 tablet, Rfl: 3   Multiple Vitamin (MULTIVITAMIN WITH MINERALS) TABS tablet, Take 1 tablet by mouth daily., Disp: , Rfl:    Multiple Vitamins-Minerals (OCUVITE PRESERVISION PO), Take 1 tablet by mouth in the morning and at bedtime., Disp: , Rfl:    omeprazole (PRILOSEC) 20 MG capsule, Take 1 capsule (20 mg total) by mouth daily., Disp: 90 capsule, Rfl: 1   potassium chloride SA (  KLOR-CON M20) 20 MEQ tablet, TAKE 1 TABLET BY MOUTH EVERY DAY, Disp: 90 tablet, Rfl: 0   torsemide (DEMADEX) 20 MG tablet, Take 1 tablet (20 mg total) by mouth daily., Disp: 90 tablet, Rfl: 0   triamcinolone (NASACORT) 55 MCG/ACT AERO nasal inhaler, Place 1 spray into the nose daily as needed for allergies (allergies)., Disp: , Rfl:    vitamin E 200 UNIT capsule, Take 200 Units by mouth daily., Disp: , Rfl:    Allergies  Allergen Reactions   Clarithromycin     Numbness of extremities   Codeine Other (See Comments)    Caused pain   Penicillins Rash    Did it involve swelling of the face/tongue/throat, SOB, or low BP? No Did it involve sudden or severe rash/hives, skin peeling, or any reaction on the inside of your mouth or nose? Yes Did you need to seek medical attention at a hospital or doctor's office? Yes When did it last happen?      20 years If all above answers are "NO", may proceed with cephalosporin use.    CONSTITUTIONAL: Negative for chills, fatigue, fever, unintentional weight gain and unintentional weight loss.  E/N/T: Negative for ear pain, nasal congestion and sore throat.  CARDIOVASCULAR: Negative for chest pain, dizziness, palpitations and pedal edema.  RESPIRATORY: Negative for recent cough and  dyspnea.  GASTROINTESTINAL: Negative for abdominal pain, acid reflux symptoms, constipation, diarrhea, nausea and vomiting.  MSK: Negative for arthralgias and myalgias.  INTEGUMENTARY: see HPI NEUROLOGICAL: Negative for dizziness and headaches.  PSYCHIATRIC: Negative for sleep disturbance and to question depression screen.  Negative for depression, negative for anhedonia.         Objective:  PHYSICAL EXAM:   VS: BP 112/70 (BP Location: Left Arm, Patient Position: Sitting, Cuff Size: Large)   Pulse 88   Temp (!) 97.1 F (36.2 C) (Temporal)   Ht 5' 5.5" (1.664 m)   Wt 244 lb (110.7 kg)   SpO2 98%   BMI 39.99 kg/m   GEN: Well nourished, well developed, in no acute distress  Cardiac: RRR; no murmurs, rubs, or gallops,no edema -  Respiratory:  normal respiratory rate and pattern with no distress - normal breath sounds with no rales, rhonchi, wheezes or rubs MS: no deformity or atrophy  Skin: back of left lower leg with large scabbed, scaly lesion  Neuro:  Alert and Oriented x 3, - CN II-Xii grossly intact Psych: euthymic mood, appropriate affect and demeanor   Health Maintenance Due  Topic Date Due   OPHTHALMOLOGY EXAM  Never done   DTaP/Tdap/Td (1 - Tdap) Never done   Zoster Vaccines- Shingrix (2 of 2) 12/20/2020   DEXA SCAN  02/24/2022    There are no preventive care reminders to display for this patient.  Lab Results  Component Value Date   TSH 1.410 02/18/2022   Lab Results  Component Value Date   WBC 4.6 06/21/2022   HGB 12.2 06/21/2022   HCT 37.6 06/21/2022   MCV 87 06/21/2022   PLT 179 06/21/2022   Lab Results  Component Value Date   NA 142 06/21/2022   K 3.4 (L) 06/21/2022   CO2 23 06/21/2022   GLUCOSE 101 (H) 06/21/2022   BUN 12 06/21/2022   CREATININE 0.87 06/21/2022   BILITOT 1.1 06/21/2022   ALKPHOS 138 (H) 06/21/2022   AST 14 06/21/2022   ALT 7 06/21/2022   PROT 6.7 06/21/2022   ALBUMIN 4.1 06/21/2022   CALCIUM 9.4 06/21/2022   ANIONGAP  10  07/29/2019   EGFR 68 06/21/2022   Lab Results  Component Value Date   CHOL 130 06/21/2022   Lab Results  Component Value Date   HDL 49 06/21/2022   Lab Results  Component Value Date   LDLCALC 66 06/21/2022   Lab Results  Component Value Date   TRIG 78 06/21/2022   Lab Results  Component Value Date   CHOLHDL 2.7 06/21/2022   Lab Results  Component Value Date   HGBA1C 6.2 (H) 06/21/2022      Assessment & Plan:   Problem List Items Addressed This Visit       Cardiovascular and Mediastinum   Hypertensive heart disease with heart failure (HCC)   Relevant Orders   CBC with Differential/Platelet   Comprehensive metabolic panel Continue current meds Follow up with cardiology as directed     Digestive   Gastro-esophageal reflux disease without esophagitis   Relevant Medications   omeprazole (PRILOSEC) 20 MG capsule Continue meds     Other   Mixed hyperlipidemia - Primary   Relevant Orders   Lipid panel Watch diet and continue meds   Vitamin D deficiency   Relevant Orders   VITAMIN D 25 Hydroxy (Vit-D Deficiency, Fractures)   Diabetes - diet controlled   Relevant Orders   Hemoglobin A1c Continue to watch diet  Atrial fibrillation Continue current meds Follow up with cardiology as directed  Atypical skin lesion Referral to dermatology   Other Visit Diagnoses         No orders of the defined types were placed in this encounter.    Follow-up: Return in about 4 months (around 03/23/2023) for chronic fasting follow-up.    SARA R Wiatt Mahabir, PA-C

## 2022-11-21 ENCOUNTER — Other Ambulatory Visit: Payer: Self-pay | Admitting: Physician Assistant

## 2022-11-21 DIAGNOSIS — R899 Unspecified abnormal finding in specimens from other organs, systems and tissues: Secondary | ICD-10-CM

## 2022-11-21 LAB — LIPID PANEL
Chol/HDL Ratio: 3.2 ratio (ref 0.0–4.4)
Cholesterol, Total: 155 mg/dL (ref 100–199)
HDL: 48 mg/dL (ref >39)
LDL Chol Calc (NIH): 86 mg/dL (ref 0–99)
Triglycerides: 118 mg/dL (ref 0–149)
VLDL Cholesterol Cal: 21 mg/dL (ref 5–40)

## 2022-11-21 LAB — CBC WITH DIFFERENTIAL/PLATELET
Basophils Absolute: 0 10*3/uL (ref 0.0–0.2)
Basos: 1 %
EOS (ABSOLUTE): 0.1 10*3/uL (ref 0.0–0.4)
Eos: 3 %
Hematocrit: 41.2 % (ref 34.0–46.6)
Hemoglobin: 13.5 g/dL (ref 11.1–15.9)
Immature Grans (Abs): 0 10*3/uL (ref 0.0–0.1)
Immature Granulocytes: 0 %
Lymphocytes Absolute: 0.8 10*3/uL (ref 0.7–3.1)
Lymphs: 19 %
MCH: 28.2 pg (ref 26.6–33.0)
MCHC: 32.8 g/dL (ref 31.5–35.7)
MCV: 86 fL (ref 79–97)
Monocytes Absolute: 0.4 10*3/uL (ref 0.1–0.9)
Monocytes: 10 %
Neutrophils Absolute: 2.7 10*3/uL (ref 1.4–7.0)
Neutrophils: 67 %
Platelets: 183 10*3/uL (ref 150–450)
RBC: 4.78 x10E6/uL (ref 3.77–5.28)
RDW: 13.7 % (ref 11.7–15.4)
WBC: 4.1 10*3/uL (ref 3.4–10.8)

## 2022-11-21 LAB — COMPREHENSIVE METABOLIC PANEL
ALT: 13 IU/L (ref 0–32)
AST: 16 IU/L (ref 0–40)
Albumin/Globulin Ratio: 1.5 (ref 1.2–2.2)
Albumin: 4.2 g/dL (ref 3.8–4.8)
Alkaline Phosphatase: 146 IU/L — ABNORMAL HIGH (ref 44–121)
BUN/Creatinine Ratio: 18 (ref 12–28)
BUN: 19 mg/dL (ref 8–27)
Bilirubin Total: 1 mg/dL (ref 0.0–1.2)
CO2: 24 mmol/L (ref 20–29)
Calcium: 9.8 mg/dL (ref 8.7–10.3)
Chloride: 100 mmol/L (ref 96–106)
Creatinine, Ser: 1.07 mg/dL — ABNORMAL HIGH (ref 0.57–1.00)
Globulin, Total: 2.8 g/dL (ref 1.5–4.5)
Glucose: 115 mg/dL — ABNORMAL HIGH (ref 70–99)
Potassium: 3.9 mmol/L (ref 3.5–5.2)
Sodium: 140 mmol/L (ref 134–144)
Total Protein: 7 g/dL (ref 6.0–8.5)
eGFR: 53 mL/min/{1.73_m2} — ABNORMAL LOW (ref >59)

## 2022-11-21 LAB — TSH: TSH: 0.311 u[IU]/mL — ABNORMAL LOW (ref 0.450–4.500)

## 2022-11-21 LAB — HEMOGLOBIN A1C
Est. average glucose Bld gHb Est-mCnc: 134 mg/dL
Hgb A1c MFr Bld: 6.3 % — ABNORMAL HIGH (ref 4.8–5.6)

## 2022-11-21 LAB — CARDIOVASCULAR RISK ASSESSMENT

## 2022-11-22 LAB — SPECIMEN STATUS REPORT

## 2022-11-22 LAB — VITAMIN D 25 HYDROXY (VIT D DEFICIENCY, FRACTURES): Vit D, 25-Hydroxy: 51.4 ng/mL (ref 30.0–100.0)

## 2022-11-27 ENCOUNTER — Telehealth: Payer: Self-pay

## 2022-11-27 NOTE — Progress Notes (Cosign Needed)
Care Management & Coordination Services Pharmacy Team  Reason for Encounter: Hypertension  Contacted patient to discuss hypertension disease state. Spoke with patient on 11/27/2022     Current antihypertensive regimen:  Isosorbide Mononitrate 30mg  daily Metoprolol Tartrate 25mg  BID Torsemide 20mg  daily Eliquis 5mg  two times daily  Patient verbally confirms she is taking the above medications as directed. Yes  How often are you checking your Blood Pressure? when feeling symptomatic  she checks her blood pressure in the afternoon after taking her medication.  Current home BP readings:  11/20/22 112/70 88 12/02/22 101/61 88 12/01/22 119/66 96 11/30/22 107/74 105 11/28/22 114/74 67  Wrist or arm cuff: Wrist  Caffeine intake: Rarely drinks soda and no coffee Salt intake:Limited  OTC medications including pseudoephedrine or NSAIDs? None  Any readings above 180/100? No  What recent interventions/DTPs have been made by any provider to improve Blood Pressure control since last CPP Visit: No recent changes  Any recent hospitalizations or ED visits since last visit with CPP? No  What diet changes have been made to improve Blood Pressure Control?  No recent changes  What exercise is being done to improve your Blood Pressure Control?  Pt has not been exercising but will may since the weather is warming  Adherence Review: Is the patient currently on ACE/ARB medication? No Does the patient have >5 day gap between last estimated fill dates? Yes Eliquis 5mg  (PAP)  11/25/22 90ds Potassium LF 06/26/22 30ds (Pt stated these are hard to take since they are so big so she has forgotten to take at times) Torsemide LF 11/14/22-06/21/22 90ds (Pt has not taking it daily everyday since last fill due to not being able to hold her urine if she goes out in public)  Star Rating Drugs:  Medication:  Last Fill: Day Supply Atorvastatin   09/29/22-07/02/22  90ds  Chart Updates: Recent office visits:  11/20/22  Marianne Sofia PA-C. Seen for follow up. Referral to Dermatology. No med changes.   Recent consult visits:  None  Hospital visits:  None  Medications: Outpatient Encounter Medications as of 11/27/2022  Medication Sig   apixaban (ELIQUIS) 5 MG TABS tablet Take 1 tablet (5 mg total) by mouth 2 (two) times daily.   Ascorbic Acid (VITAMIN C) 100 MG tablet Take 100 mg by mouth daily.   atorvastatin (LIPITOR) 20 MG tablet Take one tablet by mouth daily   calcium carbonate (OSCAL) 1500 (600 Ca) MG TABS tablet Take 600 mg of elemental calcium by mouth daily.   Cholecalciferol (VITAMIN D3 PO) Take 2 capsules by mouth daily.   isosorbide mononitrate (IMDUR) 30 MG 24 hr tablet Take 1 tablet (30 mg total) by mouth daily.   loratadine (CLARITIN) 10 MG tablet Take 1 tablet (10 mg total) by mouth daily as needed for allergies.   metoprolol tartrate (LOPRESSOR) 25 MG tablet TAKE 1 TABLET(25 MG) BY MOUTH TWICE DAILY   Multiple Vitamin (MULTIVITAMIN WITH MINERALS) TABS tablet Take 1 tablet by mouth daily.   Multiple Vitamins-Minerals (OCUVITE PRESERVISION PO) Take 1 tablet by mouth in the morning and at bedtime.   omeprazole (PRILOSEC) 20 MG capsule Take 1 capsule (20 mg total) by mouth daily.   potassium chloride SA (KLOR-CON M20) 20 MEQ tablet TAKE 1 TABLET BY MOUTH EVERY DAY   torsemide (DEMADEX) 20 MG tablet Take 1 tablet (20 mg total) by mouth daily.   triamcinolone (NASACORT) 55 MCG/ACT AERO nasal inhaler Place 1 spray into the nose daily as needed for allergies (allergies).  vitamin E 200 UNIT capsule Take 200 Units by mouth daily.   No facility-administered encounter medications on file as of 11/27/2022.    Recent Office Vitals: BP Readings from Last 3 Encounters:  11/20/22 112/70  06/21/22 122/82  05/15/22 130/86   Pulse Readings from Last 3 Encounters:  11/20/22 88  06/21/22 98  05/15/22 68    Wt Readings from Last 3 Encounters:  11/20/22 244 lb (110.7 kg)  06/21/22 252 lb (114.3 kg)   05/15/22 246 lb (111.6 kg)     Kidney Function Lab Results  Component Value Date/Time   CREATININE 1.07 (H) 11/20/2022 11:24 AM   CREATININE 0.87 06/21/2022 11:46 AM   GFRNONAA 58 (L) 02/10/2020 09:57 AM   GFRAA 67 02/10/2020 09:57 AM       Latest Ref Rng & Units 11/20/2022   11:24 AM 06/21/2022   11:46 AM 02/18/2022   10:42 AM  BMP  Glucose 70 - 99 mg/dL 161  096  045   BUN 8 - 27 mg/dL 19  12  21    Creatinine 0.57 - 1.00 mg/dL 4.09  8.11  9.14   BUN/Creat Ratio 12 - 28 18  14  24    Sodium 134 - 144 mmol/L 140  142  140   Potassium 3.5 - 5.2 mmol/L 3.9  3.4  3.9   Chloride 96 - 106 mmol/L 100  104  103   CO2 20 - 29 mmol/L 24  23  22    Calcium 8.7 - 10.3 mg/dL 9.8  9.4  9.5      Roxana Hires, [facility] Clinical Pharmacist Assistant  [phone]

## 2022-11-27 NOTE — Progress Notes (Unsigned)
Cardiology Office Note:    Date:  11/28/2022   ID:  Miranda Moses, DOB 07/05/1940, MRN [id number]  PCP:  Marianne Sofia, PA-C  Cardiologist:  Norman Herrlich, MD    Referring MD: Marianne Sofia, PA-C    ASSESSMENT:    1. Hypertensive heart disease with heart failure (HCC)   2. Longstanding persistent atrial fibrillation (HCC)   3. Chronic anticoagulation   4. Mixed hyperlipidemia   5. Coronary artery disease of native artery of native heart with stable angina pectoris (HCC)    PLAN:    In order of problems listed above:  Miranda Moses continues to do well heart failure is compensated no fluid overload and blood pressure at target she will continue her current loop diuretic and beta-blocker. Rate controlled atrial fibrillation continue her beta-blocker and anticoagulant Lipid LDL is at target she will continue her high intensity statin with CAD Stable having no anginal discomfort continue medical therapy including oral nitrate beta-blocker and statin she will be given a prescription for nitroglycerin she had a bottle that had been her husband's from years ago   Next appointment: 1 year   Medication Adjustments/Labs and Tests Ordered: Current medicines are reviewed at length with the patient today.  Concerns regarding medicines are outlined above.  No orders of the defined types were placed in this encounter.  No orders of the defined types were placed in this encounter.    History of Present Illness:    Miranda Moses is a 82 y.o. female with a hx of hypertensive heart disease with heart failure persistent atrial fibrillation with rate control and anticoagulation coronary artery disease and hyper lipidemia last seen 04/09/2022.  Cardiac CTA May 2020 showed a  Calcium score 1245/95th percentile and diffuse plaque with greater than 70% stenosis to the proximal right coronary artery moderate stenosis of proximal LAD and left circumflex coronary artery.  Compliance with diet, lifestyle and  medications: Yes  She is pleased with the quality of her life. She is not having edema orthopnea shortness of breath with her heart failure She is not having angina with CAD and she tolerates her lipid-lowering therapy with a statin Intolerance her anticoagulant without bleeding and is not having palpitation or syncope  Recent labs reviewed lipids are at target Past Medical History:  Diagnosis Date   Angina pectoris (HCC) 02/18/2018   CAD in native artery    by CT coronary 10/2018 >> medical therapy    CHF (congestive heart failure), NYHA class I, chronic, diastolic (HCC) 08/09/2019   Chronic diastolic (congestive) heart failure (HCC)    Coronary artery disease of native artery of native heart with stable angina pectoris (HCC) 08/09/2019   COVID-19 virus infection    Encounter for vaccination 10/03/2020   Family history of coronary artery disease 01/30/2018   Gastro-esophageal reflux disease without esophagitis 01/30/2018   GERD (gastroesophageal reflux disease)    Health maintenance examination 10/03/2020   History of pulmonary embolism 08/23/2019   Hyperlipidemia    Hypertension    Hypertensive heart disease with heart failure (HCC) 01/30/2018   Mixed hyperlipidemia 02/18/2018   New onset atrial fibrillation (HCC)    Other fatigue 02/10/2020   Other specified menopausal and perimenopausal disorders 02/10/2020   PAD (peripheral artery disease) (HCC) 08/28/2018   PAH (pulmonary artery hypertension) (HCC) 08/28/2018   Pre-diabetes    Prediabetes 02/10/2020   Pulmonary embolus (HCC)    Pulmonary hypertension (HCC)    Vitamin D deficiency 08/09/2019    Past Surgical History:  Procedure  Laterality Date   ABDOMINAL HYSTERECTOMY     CATARACT EXTRACTION Left 03/2014   CATARACT EXTRACTION Right 05/2014   COLONOSCOPY  11/05/2013    Current Medications: Current Meds  Medication Sig   apixaban (ELIQUIS) 5 MG TABS tablet Take 1 tablet (5 mg total) by mouth 2 (two) times daily.   Ascorbic Acid  (VITAMIN C) 100 MG tablet Take 100 mg by mouth daily.   atorvastatin (LIPITOR) 20 MG tablet Take one tablet by mouth daily   calcium carbonate (OSCAL) 1500 (600 Ca) MG TABS tablet Take 600 mg of elemental calcium by mouth daily.   Cholecalciferol (VITAMIN D3 PO) Take 2 capsules by mouth daily.   isosorbide mononitrate (IMDUR) 30 MG 24 hr tablet Take 1 tablet (30 mg total) by mouth daily.   loratadine (CLARITIN) 10 MG tablet Take 1 tablet (10 mg total) by mouth daily as needed for allergies.   metoprolol tartrate (LOPRESSOR) 25 MG tablet TAKE 1 TABLET(25 MG) BY MOUTH TWICE DAILY   Multiple Vitamin (MULTIVITAMIN WITH MINERALS) TABS tablet Take 1 tablet by mouth daily.   Multiple Vitamins-Minerals (OCUVITE PRESERVISION PO) Take 1 tablet by mouth in the morning and at bedtime.   omeprazole (PRILOSEC) 20 MG capsule Take 1 capsule (20 mg total) by mouth daily.   potassium chloride SA (KLOR-CON M20) 20 MEQ tablet TAKE 1 TABLET BY MOUTH EVERY DAY   torsemide (DEMADEX) 20 MG tablet Take 1 tablet (20 mg total) by mouth daily.   triamcinolone (NASACORT) 55 MCG/ACT AERO nasal inhaler Place 1 spray into the nose daily as needed for allergies (allergies).   vitamin E 200 UNIT capsule Take 200 Units by mouth daily.     Allergies:   Clarithromycin, Codeine, and Penicillins   Social History   Socioeconomic History   Marital status: Widowed    Spouse name: Not on file   Number of children: 1   Years of education: Not on file   Highest education level: Not on file  Occupational History   Occupation: retired  Tobacco Use   Smoking status: Never   Smokeless tobacco: Never  Vaping Use   Vaping Use: Never used  Substance and Sexual Activity   Alcohol use: Never   Drug use: Never   Sexual activity: Not on file  Other Topics Concern   Not on file  Social History Narrative   Not on file   Social Determinants of Health   Financial Resource Strain: High Risk (10/24/2022)   Overall Financial Resource  Strain (CARDIA)    Difficulty of Paying Living Expenses: Hard  Food Insecurity: No Food Insecurity (02/20/2022)   Hunger Vital Sign    Worried About Running Out of Food in the Last Year: Never true    Ran Out of Food in the Last Year: Never true  Transportation Needs: No Transportation Needs (10/24/2022)   PRAPARE - Administrator, Civil Service (Medical): No    Lack of Transportation (Non-Medical): No  Physical Activity: Inactive (02/20/2022)   Exercise Vital Sign    Days of Exercise per Week: 0 days    Minutes of Exercise per Session: 0 min  Stress: No Stress Concern Present (02/20/2022)   [employer] of Occupational Health - Occupational Stress Questionnaire    Feeling of Stress : Only a little  Social Connections: Moderately Isolated (02/20/2022)   Social Connection and Isolation Panel [NHANES]    Frequency of Communication with Friends and Family: More than three times a week  Frequency of Social Gatherings with Friends and Family: More than three times a week    Attends Religious Services: 1 to 4 times per year    Active Member of [employer] or Organizations: No    Attends Banker Meetings: Never    Marital Status: Widowed     Family History: The patient's family history includes Heart disease in her father. ROS:   Please see the history of present illness.    All other systems reviewed and are negative.  EKGs/Labs/Other Studies Reviewed:    The following studies were reviewed today:  Cardiac Studies & Procedures       ECHOCARDIOGRAM  ECHOCARDIOGRAM LIMITED 07/23/2019  Narrative ECHOCARDIOGRAM LIMITED REPORT    Patient Name:   Miranda Moses Date of Exam: 07/23/2019 Medical Rec #:  [id number]  Height:       65.0 in Accession #:    [id number] Weight:       249.2 lb Date of Birth:  09/28/1940  BSA:          2.17 m Patient Age:    78 years   BP:           115/91 mmHg Patient Gender: F          HR:           103 bpm. Exam Location:   Inpatient   Procedure: Limited Echo, Cardiac Doppler and Limited Color Doppler  Indications:    I26.02 Pulmonary embolus  History:        Patient has prior history of Echocardiogram examinations, most recent 10/14/2018.  Sonographer:    Elmarie Shiley Dance Referring Phys: [id number] TIMOTHY S OPYD  IMPRESSIONS   1. Left ventricular ejection fraction, by visual estimation, is 55 to 60%. 2. Global right ventricle has moderately reduced systolic function.The right ventricular size is moderately enlarged. no increase in right ventricular wall thickness. 3. The RV is moderately dilated with moderately reduced RV function. 4. Right atrial size was severely dilated. 5. The mitral valve is grossly normal. Mild mitral valve regurgitation. 6. The tricuspid valve was normal in structure. Tricuspid valve regurgitation is mild. 7. Tricuspid valve regurgitation is mild. 8. The pulmonic valve was normal in structure. Pulmonic valve regurgitation is mild by color flow Doppler. 9. Mildly elevated pulmonary artery systolic pressure.  FINDINGS Left Ventricle: Left ventricular ejection fraction, by visual estimation, is 55 to 60%.  Right Ventricle: The right ventricular size is moderately enlarged. No increase in right ventricular wall thickness. Global RV systolic function is has moderately reduced systolic function. The tricuspid regurgitant velocity is 2.33 m/s, and with an assumed right atrial pressure of 15 mmHg, the estimated right ventricular systolic pressure is mildly elevated at 36.8 mmHg. The RV is moderately dilated with moderately reduced RV function.  Right Atrium: Right atrial size was severely dilated. Right atrial pressure is estimated at 15 mmHg.  Mitral Valve: The mitral valve is grossly normal. MV Area by PHT, 3.77 cm. MV PHT, 58.29 msec. Mild mitral valve regurgitation.  Tricuspid Valve: The tricuspid valve is normal in structure. Tricuspid valve regurgitation is mild.  Pulmonic Valve:  The pulmonic valve was normal in structure. Pulmonic valve regurgitation is mild by color flow Doppler. Pulmonic regurgitation is mild by color flow Doppler.  Aorta: The aortic root and ascending aorta are structurally normal, with no evidence of dilitation.   LEFT VENTRICLE          Normals PLAX 2D LVIDd:  3.69 cm  3.6 cm LVIDs:         2.81 cm  1.7 cm LV PW:         1.37 cm  1.4 cm LV IVS:        1.02 cm  1.3 cm LVOT diam:     1.90 cm  2.0 cm LV SV:         28 ml    79 ml LV SV Index:   11.96    45 ml/m2 LVOT Area:     2.84 cm 3.14 cm2   RIGHT VENTRICLE          IVC RV Basal diam:  3.55 cm  IVC diam: 2.57 cm RV Mid diam:    3.17 cm TAPSE (M-mode): 1.2 cm  LEFT ATRIUM             Index       RIGHT ATRIUM           Index LA diam:        4.30 cm 1.98 cm/m  RA Area:     30.90 cm LA Vol (A2C):   67.1 ml 30.89 ml/m RA Volume:   111.00 ml 51.11 ml/m LA Vol (A4C):   68.8 ml 31.68 ml/m LA Biplane Vol: 68.0 ml 31.31 ml/m AORTIC VALVE             Normals LVOT Vmax:   81.10 cm/s LVOT Vmean:  51.400 cm/s 75 cm/s LVOT VTI:    0.140 m     25.3 cm  AORTA                 Normals Ao Root diam: 2.90 cm 31 mm Ao Asc diam:  2.30 cm 31 mm  MITRAL VALVE              Normals   TRICUSPID VALVE             Normals MV Area (PHT): 3.77 cm             TR Peak grad:   21.8 mmHg MV PHT:        58.29 msec 55 ms     TR Vmax:        261.00 cm/s 288 cm/s MV Decel Time: 201 msec   187 ms MV E velocity: 123.00 cm/s 103 cm/s SHUNTS Systemic VTI:  0.14 m Systemic Diam: 1.90 cm   Kristeen Miss MD Electronically signed by Kristeen Miss MD Signature Date/Time: 07/23/2019/5:33:58 PMThe mitral valve is grossly normal.    Final     CT SCANS  CT CORONARY MORPH W/CTA COR W/SCORE 11/11/2018  Addendum 11/11/2018  3:15 PM ADDENDUM REPORT: 11/11/2018 15:12  CLINICAL DATA:  82 year old female with hyperlipemia, hypertension and chest pain.  EXAM: Cardiac/Coronary  CT  TECHNIQUE: The  patient was scanned on a [employer].  FINDINGS: A 120 kV prospective scan was triggered in the descending thoracic aorta at 111 HU's. Axial non-contrast 3 mm slices were carried out through the heart. The data set was analyzed on a dedicated work station and scored using the Agatson method. Gantry rotation speed was 250 msecs and collimation was .6 mm. No beta blockade and 0.8 mg of sl NTG was given. The 3D data set was reconstructed in 5% intervals of the 67-82 % of the R-R cycle. Diastolic phases were analyzed on a dedicated work station using MPR, MIP and VRT modes. The patient received 80 cc of contrast.  Aorta: Normal size. Moderate  diffuse atherosclerotic plaque and calcifications. No dissection.  Aortic Valve:  Trileaflet.  Trivial calcifications.  Coronary Arteries:  Normal coronary origin.  Right dominance.  RCA is a medium caliber dominant artery that gives rise to poorly visualized PDA and PLA. There is severe calcified plaque in the ostial portion of RCA extending into the proximal portion with stenosis > 70%. Mid and distal RCA have only minimal plaque.  Left main is a short artery that gives rise to LAD and LCX arteries. There is mild calcified plaque with associated stenosis 25-50%.  LAD is a medium size vessel. There is a moderate predominantly calcified plaque in the proximal LAD with associated stenosis 50-69%. Mid and distal LAD has mild diffuse calcified plaque with stenosis 25-49%.  LCX is a non-dominant artery that gives rise to one small OM1 branch. There is moderate calcified plaque in the proximal portion with stenosis 50-69%, mid, distal LCx artery and OM1 have small lumen and mild diffuse predominantly calcified plaque with stenosis 25-49%.  Other findings:  Normal pulmonary vein drainage into the left atrium.  Normal let atrial appendage without a thrombus.  Normal size of the pulmonary artery.  IMPRESSION: 1. Coronary calcium  score of 1245. This was 95 percentile for age and sex matched control.  2. Normal coronary origin with right dominance.  3. Diffuse plaque with > 70% stenosis in the ostial/proximal RCA and moderate stenosis in the proximal LAD and LCX arteries. Additional analysis with CT FFR will be submitted.   Electronically Signed By: Tobias Alexander On: 11/11/2018 15:12  Narrative EXAM: OVER-READ INTERPRETATION  CT CHEST  The following report is an over-read performed by radiologist Dr. Deberah Pelton [facility] on 11/06/2018. This over-read does not include interpretation of cardiac or coronary anatomy or pathology. The coronary calcium score/coronary CTA interpretation by the cardiologist is attached.  COMPARISON:  None.  FINDINGS: The visualized portions of the lower lung fields show no suspicious nodules, masses, or infiltrates. A small to moderate hiatal hernia is noted.  IMPRESSION: Small to moderate hiatal hernia.  Electronically Signed: By: Myles Rosenthal M.D. On: 11/06/2018 15:16           Recent Labs: 06/21/2022: NT-Pro BNP 907 11/20/2022: ALT 13; BUN 19; Creatinine, Ser 1.07; Hemoglobin 13.5; Platelets 183; Potassium 3.9; Sodium 140; TSH 0.311  Recent Lipid Panel    Component Value Date/Time   CHOL 155 11/20/2022 1124   TRIG 118 11/20/2022 1124   HDL 48 11/20/2022 1124   CHOLHDL 3.2 11/20/2022 1124   LDLCALC 86 11/20/2022 1124    Physical Exam:    VS:  BP 116/82 (BP Location: Left Arm, Patient Position: Sitting)   Pulse 84   Ht 5' 5.5" (1.664 m)   Wt 248 lb (112.5 kg)   SpO2 99%   BMI 40.64 kg/m     Wt Readings from Last 3 Encounters:  11/28/22 248 lb (112.5 kg)  11/20/22 244 lb (110.7 kg)  06/21/22 252 lb (114.3 kg)     GEN:  Well nourished, well developed in no acute distress HEENT: Normal NECK: No JVD; No carotid bruits LYMPHATICS: No lymphadenopathy CARDIAC: RRR, no murmurs, rubs, gallops RESPIRATORY:  Clear to auscultation  without rales, wheezing or rhonchi  ABDOMEN: Soft, non-tender, non-distended MUSCULOSKELETAL:  No edema; No deformity  SKIN: Warm and dry NEUROLOGIC:  Alert and oriented x 3 PSYCHIATRIC:  Normal affect    Signed, Norman Herrlich, MD  11/28/2022 12:56 PM    [facility]

## 2022-11-28 ENCOUNTER — Ambulatory Visit: Payer: Medicare Other | Attending: Cardiology | Admitting: Cardiology

## 2022-11-28 ENCOUNTER — Encounter: Payer: Self-pay | Admitting: Cardiology

## 2022-11-28 VITALS — BP 116/82 | HR 84 | Ht 65.5 in | Wt 248.0 lb

## 2022-11-28 DIAGNOSIS — E782 Mixed hyperlipidemia: Secondary | ICD-10-CM | POA: Insufficient documentation

## 2022-11-28 DIAGNOSIS — I4811 Longstanding persistent atrial fibrillation: Secondary | ICD-10-CM | POA: Diagnosis not present

## 2022-11-28 DIAGNOSIS — I25118 Atherosclerotic heart disease of native coronary artery with other forms of angina pectoris: Secondary | ICD-10-CM | POA: Insufficient documentation

## 2022-11-28 DIAGNOSIS — I110 Hypertensive heart disease with heart failure: Secondary | ICD-10-CM | POA: Diagnosis not present

## 2022-11-28 DIAGNOSIS — Z7901 Long term (current) use of anticoagulants: Secondary | ICD-10-CM | POA: Diagnosis not present

## 2022-11-28 MED ORDER — NITROGLYCERIN 0.4 MG SL SUBL: 0.4000 mg | SUBLINGUAL_TABLET | SUBLINGUAL | 3 refills | Status: AC | PRN

## 2022-11-28 NOTE — Patient Instructions (Signed)
Medication Instructions:  Nitroglycerin Use nitroglycerin 1 tablet placed under the tongue at the first sign of chest pain or an angina attack. 1 tablet may be used every 5 minutes as needed, for up to 15 minutes. Do not take more than 3 tablets in 15 minutes. If pain persist call 911 or go to the nearest ED.     Lab Work: None Ordered If you have labs (blood work) drawn today and your tests are completely normal, you will receive your results only by: MyChart Message (if you have MyChart) OR A paper copy in the mail If you have any lab test that is abnormal or we need to change your treatment, we will call you to review the results.   Testing/Procedures: None Ordered   Follow-Up: At [facility], you and your health needs are our priority.  As part of our continuing mission to provide you with exceptional heart care, we have created designated Provider Care Teams.  These Care Teams include your primary Cardiologist (physician) and Advanced Practice Providers (APPs -  Physician Assistants and Nurse Practitioners) who all work together to provide you with the care you need, when you need it.  We recommend signing up for the patient portal called "MyChart".  Sign up information is provided on this After Visit Summary.  MyChart is used to connect with patients for Virtual Visits (Telemedicine).  Patients are able to view lab/test results, encounter notes, upcoming appointments, etc.  Non-urgent messages can be sent to your provider as well.   To learn more about what you can do with MyChart, go to [email or web address].    Your next appointment:   1 year(s)  The format for your next appointment:   In Person  Provider:   Gypsy Balsam, MD    Other Instructions NA ol

## 2022-12-02 ENCOUNTER — Telehealth: Payer: Self-pay

## 2022-12-02 NOTE — Progress Notes (Cosign Needed)
Patient has been approved to receive Eliquis from [facility] [employer] patent assistance from 11/20/2022 to 06/24/2023.   Billee Cashing, CMA Clinical Pharmacist Assistant [phone]

## 2022-12-11 ENCOUNTER — Other Ambulatory Visit: Payer: Medicare Other

## 2022-12-11 DIAGNOSIS — R899 Unspecified abnormal finding in specimens from other organs, systems and tissues: Secondary | ICD-10-CM | POA: Diagnosis not present

## 2022-12-11 DIAGNOSIS — E039 Hypothyroidism, unspecified: Secondary | ICD-10-CM | POA: Diagnosis not present

## 2022-12-12 ENCOUNTER — Telehealth: Payer: Self-pay | Admitting: Physician Assistant

## 2022-12-12 LAB — THYROID PANEL WITH TSH
Free Thyroxine Index: 2.1 (ref 1.2–4.9)
T3 Uptake Ratio: 27 % (ref 24–39)
T4, Total: 7.7 ug/dL (ref 4.5–12.0)
TSH: 2.49 u[IU]/mL (ref 0.450–4.500)

## 2022-12-12 NOTE — Telephone Encounter (Signed)
Patient called back in regards to lab results, made pt ware of results,pt understood.

## 2022-12-24 DIAGNOSIS — C44729 Squamous cell carcinoma of skin of left lower limb, including hip: Secondary | ICD-10-CM | POA: Diagnosis not present

## 2022-12-24 DIAGNOSIS — L578 Other skin changes due to chronic exposure to nonionizing radiation: Secondary | ICD-10-CM | POA: Diagnosis not present

## 2022-12-26 ENCOUNTER — Other Ambulatory Visit: Payer: Self-pay | Admitting: Physician Assistant

## 2022-12-26 DIAGNOSIS — K219 Gastro-esophageal reflux disease without esophagitis: Secondary | ICD-10-CM

## 2023-01-06 DIAGNOSIS — D0472 Carcinoma in situ of skin of left lower limb, including hip: Secondary | ICD-10-CM | POA: Diagnosis not present

## 2023-01-09 ENCOUNTER — Ambulatory Visit (INDEPENDENT_AMBULATORY_CARE_PROVIDER_SITE_OTHER): Payer: Medicare Other

## 2023-01-09 VITALS — Ht 65.5 in | Wt 248.0 lb

## 2023-01-09 DIAGNOSIS — Z0000 Encounter for general adult medical examination without abnormal findings: Secondary | ICD-10-CM

## 2023-01-09 NOTE — Patient Instructions (Addendum)
[name] , Thank you for taking time to come for your Medicare Wellness Visit. I appreciate your ongoing commitment to your health goals. Please review the following plan we discussed and let me know if I can assist you in the future.   These are the goals we discussed:  Goals       Learn More About My Health      Timeframe:  Long-Range Goal Priority:  High Start Date:         08/24/2020                    Expected End Date:           08/24/2021             Follow Up Date 02/28/2021    - tell my story and reason for my visit - repeat what I heard to make sure I understand - bring a list of my medicines to the visit - speak up when I don't understand    Why is this important?   The best way to learn about your health and care is by talking to the doctor and nurse.  They will answer your questions and give you information in the way that you like best.    Notes:       Lifestyle Change-Hypertension      Timeframe:  Long-Range Goal Priority:  High Start Date:        08/24/2020                     Expected End Date:     08/24/2021                  Follow Up Date 02/28/2021    - agree to work together to make changes - ask questions to understand    Why is this important?   The changes that you are asked to make may be hard to do.  This is especially true when the changes are life-long.  Knowing why it is important to you is the first step.  Working on the change with your family or support person helps you not feel alone.  Reward yourself and family or support person when goals are met. This can be an activity you choose like bowling, hiking, biking, swimming or shooting hoops.     Notes:       Pharmacy Care Plan      CARE PLAN ENTRY (see longitudinal plan of care for additional care plan information)  Current Barriers:  Chronic Disease Management support, education, and care coordination needs related to Hypertension and Coronary Artery Disease   Hypertension BP  Readings from Last 3 Encounters:  02/16/20 (!) 108/52  02/10/20 108/62  11/08/19 138/82   Pharmacist Clinical Goal(s): Over the next 90 days, patient will work with PharmD and providers to maintain BP goal <130/80 Current regimen:  Furosemide 40 mg daily prn edema Isosorbide mn 30 mg daily  Metoprolol tartrate 25 mg bid Interventions: Discussed healthy diet choices and incorporating more vegetables in diet.  Discussed goals of exercise. Encouraged patient to work up slowly to >150 minutes each week on exercise bike.  Reviewed home blood pressure reading.  Patient self care activities - Over the next 90 days, patient will: Check BP monthly, document, and provide at future appointments Ensure daily salt intake < 2300 mg/day  Hyperlipidemia/CAD Lab Results  Component Value Date/Time   LDLCALC 86 02/10/2020 09:57 AM  Pharmacist Clinical Goal(s): Over the next 90 days, patient will work with PharmD and providers to achieve LDL goal < 70 Current regimen:  aspirin EC 81 mg daily Atorvastatin 20 mg daily Interventions: Discussed healthy diet and exercise goals.  Encouraged patient to begin working on >150 minutes on exercise bike each week of moderate exercise.  Updated lipid panel scheduled for 08/22/2019. Will review at next visit.   Patient self care activities - Over the next 90 days, patient will: Continue to take medication daily as prescribed.  Work on [employer] and increasing exercise.   Diabetes Lab Results  Component Value Date/Time   HGBA1C 7.1 (H) 02/10/2020 09:57 AM   HGBA1C 6.7 (H) 11/08/2019 10:10 AM   Pharmacist Clinical Goal(s): Over the next 90 days, patient will work with PharmD and providers to achieve A1c goal <7% Current regimen:  Diet and Exercise Interventions: Discussed importance of healthy diet and monitoring carbohydrate and sugar intake.  Encouraged patient the importance of exercise in managing elevated blood sugar. Discussed goal of 150  minutes each week.  Discussed the benefit of weight loss in managing/preventing diabetes.  Patient self care activities - Over the next 90 days, patient will: Patient plans to watch diet more closely after Christmas. Will work on increasing her time on exercise bike and balancing her plate.   Afib Pharmacist Clinical Goal(s) Over the next 90 days, patient will work with PharmD and providers to manage Afib and prevent blood clots Current regimen:  Eliquis 5 mg bid  Metoprolol tartrate 25 mg bid  Interventions: Reviewed cost of Eliquis and options to make more affordable.  Encouraged patient to apply for Extra help on medicare to make medications more affordable. Provided patient [facility] phone number to request an application ([phone]).  Reviewed patient assistance application requirements to receive Eliquis through [employer]. Patient doesn't feel that she will meet the 3% out of pocket requirement. Patient will call pharmacist if that changes.  Pharmacist coordinated 1 month of Eliquis samples to be picked up in the office at patient convenience.  Patient self care activities - Over the next 90 days, patient will: Fill out application for Medicare Extra Help.  Contact pharmacist if needs to proceed with patient assistance.  Pick up samples from office for additional month of Eliquis.   Medication management Pharmacist Clinical Goal(s): Over the next 90 days, patient will work with PharmD and providers to maintain optimal medication adherence Current pharmacy: [facility] Interventions Comprehensive medication review performed. Continue current medication management strategy.  Patient self care activities - Over the next 90 days, patient will: Focus on medication adherence by continuing to use pill box Take medications as prescribed Report any questions or concerns to PharmD and/or provider(s)  Initial goal documentation       Stay Healthy (pt-stated)      Track  and Manage Symptoms-Heart Failure      Timeframe:  Long-Range Goal Priority:  High Start Date:      08/24/2020           Expected End Date:  08/24/2021               Follow Up Date  02/28/2021       - begin a heart failure diary - eat more whole grains, fruits and vegetables, lean meats and healthy fats - know when to call the doctor - track symptoms and what helps feel better or worse    Why is this important?   You will  be able to handle your symptoms better if you keep track of them.  Making some simple changes to your lifestyle will help.  Eating healthy is one thing you can do to take good care of yourself.    Notes:         This is a list of the screening recommended for you and due dates:  Health Maintenance  Topic Date Due   DTaP/Tdap/Td vaccine (1 - Tdap) Never done   DEXA scan (bone density measurement)  02/24/2022   Eye exam for diabetics  01/09/2023*   Yearly kidney health urinalysis for diabetes  01/10/2023*   Zoster (Shingles) Vaccine (2 of 2) 04/11/2023*   Flu Shot  01/23/2023   Mammogram  03/16/2023   Hemoglobin A1C  05/23/2023   Yearly kidney function blood test for diabetes  11/20/2023   Complete foot exam   11/20/2023   Medicare Annual Wellness Visit  01/09/2024   Pneumonia Vaccine  Completed   HPV Vaccine  Aged Out   COVID-19 Vaccine  Discontinued  *Topic was postponed. The date shown is not the original due date.    Advanced directives: Advance directive discussed with you today. Even though you declined this today, please call our office should you change your mind, and we can give you the proper paperwork for you to fill out.   Conditions/risks identified: None  Next appointment: Follow up in one year for your annual wellness visit     Preventive Care 65 Years and Older, Female Preventive care refers to lifestyle choices and visits with your health care provider that can promote health and wellness. What does preventive care include? A  yearly physical exam. This is also called an annual well check. Dental exams once or twice a year. Routine eye exams. Ask your health care provider how often you should have your eyes checked. Personal lifestyle choices, including: Daily care of your teeth and gums. Regular physical activity. Eating a healthy diet. Avoiding tobacco and drug use. Limiting alcohol use. Practicing safe sex. Taking low-dose aspirin every day. Taking vitamin and mineral supplements as recommended by your health care provider. What happens during an annual well check? The services and screenings done by your health care provider during your annual well check will depend on your age, overall health, lifestyle risk factors, and family history of disease. Counseling  Your health care provider may ask you questions about your: Alcohol use. Tobacco use. Drug use. Emotional well-being. Home and relationship well-being. Sexual activity. Eating habits. History of falls. Memory and ability to understand (cognition). Work and work Astronomer. Reproductive health. Screening  You may have the following tests or measurements: Height, weight, and BMI. Blood pressure. Lipid and cholesterol levels. These may be checked every 5 years, or more frequently if you are over 45 years old. Skin check. Lung cancer screening. You may have this screening every year starting at age 2 if you have a 30-pack-year history of smoking and currently smoke or have quit within the past 15 years. Fecal occult blood test (FOBT) of the stool. You may have this test every year starting at age 12. Flexible sigmoidoscopy or colonoscopy. You may have a sigmoidoscopy every 5 years or a colonoscopy every 10 years starting at age 55. Hepatitis C blood test. Hepatitis B blood test. Sexually transmitted disease (STD) testing. Diabetes screening. This is done by checking your blood sugar (glucose) after you have not eaten for a while (fasting).  You may have this done every 1-3 years.  Bone density scan. This is done to screen for osteoporosis. You may have this done starting at age 2. Mammogram. This may be done every 1-2 years. Talk to your health care provider about how often you should have regular mammograms. Talk with your health care provider about your test results, treatment options, and if necessary, the need for more tests. Vaccines  Your health care provider may recommend certain vaccines, such as: Influenza vaccine. This is recommended every year. Tetanus, diphtheria, and acellular pertussis (Tdap, Td) vaccine. You may need a Td booster every 10 years. Zoster vaccine. You may need this after age 43. Pneumococcal 13-valent conjugate (PCV13) vaccine. One dose is recommended after age 46. Pneumococcal polysaccharide (PPSV23) vaccine. One dose is recommended after age 67. Talk to your health care provider about which screenings and vaccines you need and how often you need them. This information is not intended to replace advice given to you by your health care provider. Make sure you discuss any questions you have with your health care provider. Document Released: 07/07/2015 Document Revised: 02/28/2016 Document Reviewed: 04/11/2015 Elsevier Interactive Patient Education  2017 [employer].  Fall Prevention in the Home Falls can cause injuries. They can happen to people of all ages. There are many things you can do to make your home safe and to help prevent falls. What can I do on the outside of my home? Regularly fix the edges of walkways and driveways and fix any cracks. Remove anything that might make you trip as you walk through a door, such as a raised step or threshold. Trim any bushes or trees on the path to your home. Use bright outdoor lighting. Clear any walking paths of anything that might make someone trip, such as rocks or tools. Regularly check to see if handrails are loose or broken. Make sure that both sides  of any steps have handrails. Any raised decks and porches should have guardrails on the edges. Have any leaves, snow, or ice cleared regularly. Use sand or salt on walking paths during winter. Clean up any spills in your garage right away. This includes oil or grease spills. What can I do in the bathroom? Use night lights. Install grab bars by the toilet and in the tub and shower. Do not use towel bars as grab bars. Use non-skid mats or decals in the tub or shower. If you need to sit down in the shower, use a plastic, non-slip stool. Keep the floor dry. Clean up any water that spills on the floor as soon as it happens. Remove soap buildup in the tub or shower regularly. Attach bath mats securely with double-sided non-slip rug tape. Do not have throw rugs and other things on the floor that can make you trip. What can I do in the bedroom? Use night lights. Make sure that you have a light by your bed that is easy to reach. Do not use any sheets or blankets that are too big for your bed. They should not hang down onto the floor. Have a firm chair that has side arms. You can use this for support while you get dressed. Do not have throw rugs and other things on the floor that can make you trip. What can I do in the kitchen? Clean up any spills right away. Avoid walking on wet floors. Keep items that you use a lot in easy-to-reach places. If you need to reach something above you, use a strong step stool that has a grab bar. Keep  electrical cords out of the way. Do not use floor polish or wax that makes floors slippery. If you must use wax, use non-skid floor wax. Do not have throw rugs and other things on the floor that can make you trip. What can I do with my stairs? Do not leave any items on the stairs. Make sure that there are handrails on both sides of the stairs and use them. Fix handrails that are broken or loose. Make sure that handrails are as long as the stairways. Check any  carpeting to make sure that it is firmly attached to the stairs. Fix any carpet that is loose or worn. Avoid having throw rugs at the top or bottom of the stairs. If you do have throw rugs, attach them to the floor with carpet tape. Make sure that you have a light switch at the top of the stairs and the bottom of the stairs. If you do not have them, ask someone to add them for you. What else can I do to help prevent falls? Wear shoes that: Do not have high heels. Have rubber bottoms. Are comfortable and fit you well. Are closed at the toe. Do not wear sandals. If you use a stepladder: Make sure that it is fully opened. Do not climb a closed stepladder. Make sure that both sides of the stepladder are locked into place. Ask someone to hold it for you, if possible. Clearly mark and make sure that you can see: Any grab bars or handrails. First and last steps. Where the edge of each step is. Use tools that help you move around (mobility aids) if they are needed. These include: Canes. Walkers. Scooters. Crutches. Turn on the lights when you go into a dark area. Replace any light bulbs as soon as they burn out. Set up your furniture so you have a clear path. Avoid moving your furniture around. If any of your floors are uneven, fix them. If there are any pets around you, be aware of where they are. Review your medicines with your doctor. Some medicines can make you feel dizzy. This can increase your chance of falling. Ask your doctor what other things that you can do to help prevent falls. This information is not intended to replace advice given to you by your health care provider. Make sure you discuss any questions you have with your health care provider. Document Released: 04/06/2009 Document Revised: 11/16/2015 Document Reviewed: 07/15/2014 Elsevier Interactive Patient Education  2017 [employer].

## 2023-01-09 NOTE — Progress Notes (Signed)
Subjective:   Miranda Moses is a 82 y.o. female who presents for Medicare Annual (Subsequent) preventive examination.  Visit Complete: Virtual  I connected with  Miranda Moses on 01/09/23 by a audio enabled telemedicine application and verified that I am speaking with the correct person using two identifiers.  Patient Location: Home  Provider Location: Home Office  I discussed the limitations of evaluation and management by telemedicine. The patient expressed understanding and agreed to proceed.  Patient Medicare AWV questionnaire was completed by the patient on  ; I have confirmed that all information answered by patient is correct and no changes since this date.  Review of Systems      Cardiac Risk Factors include: advanced age (>34men, >77 women);hypertension     Objective:    Today's Vitals   01/09/23 1503 01/09/23 1504  Weight: 248 lb (112.5 kg)   Height: 5' 5.5" (1.664 m)   PainSc:  0-No pain   Body mass index is 40.64 kg/m.     01/09/2023    3:10 PM 10/03/2020   10:05 AM 07/23/2019    2:00 PM  Advanced Directives  Does Patient Have a Medical Advance Directive? No No No  Would patient like information on creating a medical advance directive? No - Patient declined No - Patient declined No - Patient declined    Current Medications (verified) Outpatient Encounter Medications as of 01/09/2023  Medication Sig   apixaban (ELIQUIS) 5 MG TABS tablet Take 1 tablet (5 mg total) by mouth 2 (two) times daily.   Ascorbic Acid (VITAMIN C) 100 MG tablet Take 100 mg by mouth daily.   atorvastatin (LIPITOR) 20 MG tablet TAKE 1 TABLET BY MOUTH EVERY DAY   calcium carbonate (OSCAL) 1500 (600 Ca) MG TABS tablet Take 600 mg of elemental calcium by mouth daily.   Cholecalciferol (VITAMIN D3 PO) Take 2 capsules by mouth daily.   isosorbide mononitrate (IMDUR) 30 MG 24 hr tablet Take 1 tablet (30 mg total) by mouth daily.   loratadine (CLARITIN) 10 MG tablet Take 1 tablet (10 mg total) by  mouth daily as needed for allergies.   metoprolol tartrate (LOPRESSOR) 25 MG tablet TAKE 1 TABLET(25 MG) BY MOUTH TWICE DAILY   Multiple Vitamin (MULTIVITAMIN WITH MINERALS) TABS tablet Take 1 tablet by mouth daily.   Multiple Vitamins-Minerals (OCUVITE PRESERVISION PO) Take 1 tablet by mouth in the morning and at bedtime.   nitroGLYCERIN (NITROSTAT) 0.4 MG SL tablet Place 1 tablet (0.4 mg total) under the tongue every 5 (five) minutes as needed for chest pain.   omeprazole (PRILOSEC) 20 MG capsule TAKE 1 CAPSULE BY MOUTH EVERY DAY   potassium chloride SA (KLOR-CON M20) 20 MEQ tablet TAKE 1 TABLET BY MOUTH EVERY DAY   torsemide (DEMADEX) 20 MG tablet Take 1 tablet (20 mg total) by mouth daily.   triamcinolone (NASACORT) 55 MCG/ACT AERO nasal inhaler Place 1 spray into the nose daily as needed for allergies (allergies).   vitamin E 200 UNIT capsule Take 200 Units by mouth daily.   No facility-administered encounter medications on file as of 01/09/2023.    Allergies (verified) Clarithromycin, Codeine, and Penicillins   History: Past Medical History:  Diagnosis Date   Angina pectoris (HCC) 02/18/2018   CAD in native artery    by CT coronary 10/2018 >> medical therapy    CHF (congestive heart failure), NYHA class I, chronic, diastolic (HCC) 08/09/2019   Chronic diastolic (congestive) heart failure (HCC)    Coronary artery disease of  native artery of native heart with stable angina pectoris (HCC) 08/09/2019   COVID-19 virus infection    Encounter for vaccination 10/03/2020   Family history of coronary artery disease 01/30/2018   Gastro-esophageal reflux disease without esophagitis 01/30/2018   GERD (gastroesophageal reflux disease)    Health maintenance examination 10/03/2020   History of pulmonary embolism 08/23/2019   Hyperlipidemia    Hypertension    Hypertensive heart disease with heart failure (HCC) 01/30/2018   Mixed hyperlipidemia 02/18/2018   New onset atrial fibrillation (HCC)    Other  fatigue 02/10/2020   Other specified menopausal and perimenopausal disorders 02/10/2020   PAD (peripheral artery disease) (HCC) 08/28/2018   PAH (pulmonary artery hypertension) (HCC) 08/28/2018   Pre-diabetes    Prediabetes 02/10/2020   Pulmonary embolus (HCC)    Pulmonary hypertension (HCC)    Vitamin D deficiency 08/09/2019   Past Surgical History:  Procedure Laterality Date   ABDOMINAL HYSTERECTOMY     CATARACT EXTRACTION Left 03/2014   CATARACT EXTRACTION Right 05/2014   COLONOSCOPY  11/05/2013   Family History  Problem Relation Age of Onset   Heart disease Father    Social History   Socioeconomic History   Marital status: Widowed    Spouse name: Not on file   Number of children: 1   Years of education: Not on file   Highest education level: Not on file  Occupational History   Occupation: retired  Tobacco Use   Smoking status: Never   Smokeless tobacco: Never  Vaping Use   Vaping status: Never Used  Substance and Sexual Activity   Alcohol use: Never   Drug use: Never   Sexual activity: Not on file  Other Topics Concern   Not on file  Social History Narrative   Not on file   Social Determinants of Health   Financial Resource Strain: Low Risk  (01/09/2023)   Overall Financial Resource Strain (CARDIA)    Difficulty of Paying Living Expenses: Not hard at all  Recent Concern: Financial Resource Strain - High Risk (10/24/2022)   Overall Financial Resource Strain (CARDIA)    Difficulty of Paying Living Expenses: Hard  Food Insecurity: No Food Insecurity (01/09/2023)   Hunger Vital Sign    Worried About Running Out of Food in the Last Year: Never true    Ran Out of Food in the Last Year: Never true  Transportation Needs: No Transportation Needs (01/09/2023)   PRAPARE - Administrator, Civil Service (Medical): No    Lack of Transportation (Non-Medical): No  Physical Activity: Inactive (01/09/2023)   Exercise Vital Sign    Days of Exercise per Week: 0 days     Minutes of Exercise per Session: 0 min  Stress: No Stress Concern Present (01/09/2023)   [employer] of Occupational Health - Occupational Stress Questionnaire    Feeling of Stress : Not at all  Social Connections: Moderately Integrated (01/09/2023)   Social Connection and Isolation Panel [NHANES]    Frequency of Communication with Friends and Family: More than three times a week    Frequency of Social Gatherings with Friends and Family: More than three times a week    Attends Religious Services: More than 4 times per year    Active Member of [employer] or Organizations: Yes    Attends Banker Meetings: More than 4 times per year    Marital Status: Widowed    Tobacco Counseling Counseling given: Not Answered   Clinical Intake:  Pre-visit preparation  completed: No  Pain : No/denies pain Pain Score: 0-No pain     BMI - recorded: 40.64 Nutritional Status: BMI > 30  Obese Nutritional Risks: None Diabetes: No  How often do you need to have someone help you when you read instructions, pamphlets, or other written materials from your doctor or pharmacy?: 1 - Never  Interpreter Needed?: No  Information entered by :: Miranda Mulligan LPN   Activities of Daily Living    01/09/2023    3:09 PM 02/20/2022    3:05 PM  In your present state of health, do you have any difficulty performing the following activities:  Hearing? 0 0  Vision? 0 0  Difficulty concentrating or making decisions? 0 1  Comment  Patient states sometimes she forget things  Walking or climbing stairs? 0 1  Dressing or bathing? 0 0  Doing errands, shopping? 0 0  Preparing Food and eating ? N N  Using the Toilet? N N  In the past six months, have you accidently leaked urine? N N  Do you have problems with loss of bowel control? N N  Managing your Medications? N N  Managing your Finances? N N  Housekeeping or managing your Housekeeping? N N    Patient Care Team: Marianne Sofia, Cordelia Poche as PCP - General  (Physician Assistant) Zettie Pho, [facility] (Inactive) (Pharmacist)  Indicate any recent Medical Services you may have received from other than [facility] providers in the past year (date may be approximate).     Assessment:   This is a routine wellness examination for Miranda Moses.  Hearing/Vision screen Hearing Screening - Comments:: Denies hearing difficulties   Vision Screening - Comments:: Wears rx glasses - up to date with routine eye exams with  [facility]  Dietary issues and exercise activities discussed:     Goals Addressed               This Visit's Progress     Stay Healthy (pt-stated)         Depression Screen    01/09/2023    3:09 PM 11/20/2022   10:52 AM 02/20/2022    3:04 PM 02/20/2022    3:01 PM 10/03/2020   10:05 AM  PHQ 2/9 Scores  PHQ - 2 Score 0 0 0 0 0  PHQ- 9 Score 0 0 0      Fall Risk    01/09/2023    3:10 PM 11/20/2022   10:51 AM 02/20/2022    3:04 PM 10/03/2020   10:05 AM 05/12/2018    4:33 PM  Fall Risk   Falls in the past year? 0 0 0 0 0  Comment     Emmi Telephone Survey: data to providers prior to load  Number falls in past yr: 0 0 0 0   Injury with Fall? 0 0 0 0   Risk for fall due to : No Fall Risks No Fall Risks  No Fall Risks   Follow up Falls prevention discussed Falls evaluation completed Falls evaluation completed Falls evaluation completed     MEDICARE RISK AT HOME:  Medicare Risk at Home - 01/09/23 1517     Any stairs in or around the home? Yes    If so, are there any without handrails? No    Home free of loose throw rugs in walkways, pet beds, electrical cords, etc? Yes    Adequate lighting in your home to reduce risk of falls? Yes    Life alert? No  Use of a cane, walker or w/c? No    Grab bars in the bathroom? No    Shower chair or bench in shower? No    Elevated toilet seat or a handicapped toilet? Yes             TIMED UP AND GO:  Was the test performed?  No    Cognitive Function:         01/09/2023    3:10 PM 02/20/2022    3:12 PM  6CIT Screen  What Year? 0 points 0 points  What month? 0 points 0 points  What time? 0 points 0 points  Count back from 20 0 points 0 points  Months in reverse 0 points 0 points  Repeat phrase 0 points 0 points  Total Score 0 points 0 points    Immunizations Immunization History  Administered Date(s) Administered   Fluad Quad(high Dose 65+) 03/23/2020, 02/19/2021   Influenza-Unspecified 03/13/2017, 06/24/2018, 05/07/2019   PFIZER(Purple Top)SARS-COV-2 Vaccination 12/09/2019, 12/28/2019   Pneumococcal Conjugate-13 04/13/2014   Pneumococcal Polysaccharide-23 08/21/2021   Pneumococcal-Unspecified 05/31/2009   Zoster Recombinant(Shingrix) 10/25/2020   Zoster, Live 02/11/2014    TDAP status: Due, Education has been provided regarding the importance of this vaccine. Advised may receive this vaccine at local pharmacy or Health Dept. Aware to provide a copy of the vaccination record if obtained from local pharmacy or Health Dept. Verbalized acceptance and understanding.  Flu Vaccine status: Declined, Education has been provided regarding the importance of this vaccine but patient still declined. Advised may receive this vaccine at local pharmacy or Health Dept. Aware to provide a copy of the vaccination record if obtained from local pharmacy or Health Dept. Verbalized acceptance and understanding.  Pneumococcal vaccine status: Up to date    Qualifies for Shingles Vaccine? Yes   Zostavax completed No   Shingrix Completed?: No.    Education has been provided regarding the importance of this vaccine. Patient has been advised to call insurance company to determine out of pocket expense if they have not yet received this vaccine. Advised may also receive vaccine at local pharmacy or Health Dept. Verbalized acceptance and understanding.  Screening Tests Health Maintenance  Topic Date Due   DTaP/Tdap/Td (1 - Tdap) Never done   DEXA SCAN   02/24/2022   OPHTHALMOLOGY EXAM  01/09/2023 (Originally 09/18/1950)   Diabetic kidney evaluation - Urine ACR  01/10/2023 (Originally 09/18/1958)   Zoster Vaccines- Shingrix (2 of 2) 04/11/2023 (Originally 12/20/2020)   INFLUENZA VACCINE  01/23/2023   MAMMOGRAM  03/16/2023   HEMOGLOBIN A1C  05/23/2023   Diabetic kidney evaluation - eGFR measurement  11/20/2023   FOOT EXAM  11/20/2023   Medicare Annual Wellness (AWV)  01/09/2024   Pneumonia Vaccine 31+ Years old  Completed   HPV VACCINES  Aged Out   COVID-19 Vaccine  Discontinued    Health Maintenance  Health Maintenance Due  Topic Date Due   DTaP/Tdap/Td (1 - Tdap) Never done   DEXA SCAN  02/24/2022    Colorectal cancer screening: No longer required.   Mammogram status: Completed 03/15/22. Repeat every year  Bone Density status: Ordered 04/25/22. Pt provided with contact info and advised to call to schedule appt.  Lung Cancer Screening: (Low Dose CT Chest recommended if Age 75-80 years, 20 pack-year currently smoking OR have quit w/in 15years.) does not qualify.     Additional Screening:  Hepatitis C Screening: does not qualify; Completed    Vision Screening: Recommended annual ophthalmology exams for  early detection of glaucoma and other disorders of the eye. Is the patient up to date with their annual eye exam?  Yes  Who is the provider or what is the name of the office in which the patient attends annual eye exams? [facility] If pt is not established with a provider, would they like to be referred to a provider to establish care? No .   Dental Screening: Recommended annual dental exams for proper oral hygiene    Community Resource Referral / Chronic Care Management:  CRR required this visit?  No   CCM required this visit?  No     Plan:     I have personally reviewed and noted the following in the patient's chart:   Medical and social history Use of alcohol, tobacco or illicit drugs  Current  medications and supplements including opioid prescriptions. Patient is not currently taking opioid prescriptions. Functional ability and status Nutritional status Physical activity Advanced directives List of other physicians Hospitalizations, surgeries, and ER visits in previous 12 months Vitals Screenings to include cognitive, depression, and falls Referrals and appointments  In addition, I have reviewed and discussed with patient certain preventive protocols, quality metrics, and best practice recommendations. A written personalized care plan for preventive services as well as general preventive health recommendations were provided to patient.     Tillie Rung, LPN   [id number]   After Visit Summary: (Mail) Due to this being a telephonic visit, the after visit summary with patients personalized plan was offered to patient via mail   Nurse Notes: Patient due Diabetic Kidney Evaluation -Urine ACR

## 2023-01-10 DIAGNOSIS — H353121 Nonexudative age-related macular degeneration, left eye, early dry stage: Secondary | ICD-10-CM | POA: Diagnosis not present

## 2023-01-10 DIAGNOSIS — H353211 Exudative age-related macular degeneration, right eye, with active choroidal neovascularization: Secondary | ICD-10-CM | POA: Diagnosis not present

## 2023-01-10 DIAGNOSIS — H43813 Vitreous degeneration, bilateral: Secondary | ICD-10-CM | POA: Diagnosis not present

## 2023-01-31 DIAGNOSIS — H353211 Exudative age-related macular degeneration, right eye, with active choroidal neovascularization: Secondary | ICD-10-CM | POA: Diagnosis not present

## 2023-02-16 ENCOUNTER — Other Ambulatory Visit: Payer: Self-pay | Admitting: Physician Assistant

## 2023-02-16 DIAGNOSIS — R0602 Shortness of breath: Secondary | ICD-10-CM

## 2023-03-25 ENCOUNTER — Ambulatory Visit: Payer: Medicare Other | Admitting: Physician Assistant

## 2023-03-27 ENCOUNTER — Encounter: Payer: Self-pay | Admitting: Physician Assistant

## 2023-03-27 ENCOUNTER — Ambulatory Visit: Payer: Medicare Other | Admitting: Physician Assistant

## 2023-03-27 VITALS — BP 118/70 | HR 83 | Temp 97.5°F | Ht 65.5 in | Wt 251.0 lb

## 2023-03-27 DIAGNOSIS — G8929 Other chronic pain: Secondary | ICD-10-CM

## 2023-03-27 DIAGNOSIS — N958 Other specified menopausal and perimenopausal disorders: Secondary | ICD-10-CM | POA: Diagnosis not present

## 2023-03-27 DIAGNOSIS — Z1231 Encounter for screening mammogram for malignant neoplasm of breast: Secondary | ICD-10-CM

## 2023-03-27 DIAGNOSIS — K219 Gastro-esophageal reflux disease without esophagitis: Secondary | ICD-10-CM | POA: Diagnosis not present

## 2023-03-27 DIAGNOSIS — M25562 Pain in left knee: Secondary | ICD-10-CM

## 2023-03-27 DIAGNOSIS — I4811 Longstanding persistent atrial fibrillation: Secondary | ICD-10-CM | POA: Diagnosis not present

## 2023-03-27 DIAGNOSIS — E559 Vitamin D deficiency, unspecified: Secondary | ICD-10-CM | POA: Diagnosis not present

## 2023-03-27 DIAGNOSIS — E119 Type 2 diabetes mellitus without complications: Secondary | ICD-10-CM | POA: Insufficient documentation

## 2023-03-27 DIAGNOSIS — Z23 Encounter for immunization: Secondary | ICD-10-CM

## 2023-03-27 DIAGNOSIS — E782 Mixed hyperlipidemia: Secondary | ICD-10-CM

## 2023-03-27 DIAGNOSIS — I5032 Chronic diastolic (congestive) heart failure: Secondary | ICD-10-CM

## 2023-03-27 DIAGNOSIS — I110 Hypertensive heart disease with heart failure: Secondary | ICD-10-CM | POA: Diagnosis not present

## 2023-03-27 DIAGNOSIS — E1169 Type 2 diabetes mellitus with other specified complication: Secondary | ICD-10-CM

## 2023-03-27 DIAGNOSIS — I739 Peripheral vascular disease, unspecified: Secondary | ICD-10-CM

## 2023-03-27 NOTE — Progress Notes (Signed)
Established Patient Office Visit  Subjective:  Patient ID: Miranda Moses, female    DOB: March 23, 1941  Age: 82 y.o. MRN: [id number]  CC:  Chief Complaint  Patient presents with   Medical Management of Chronic Issues    HPI Miranda Moses presents for chronic follow up hyperlipidemia  Mixed hyperlipidemia  Pt presents with hyperlipidemia.  Compliance with treatment has been good; The patient is compliant with medications, maintains a low cholesterol diet , follows up as directed ,  . The patient denies experiencing any hypercholesterolemia related symptoms. . Pt currently on atorvastatin 20mg  qd  Pt presents for follow up of hypertension.  The patient is tolerating the medication well without side effects. Compliance with treatment has been good; including taking medication as directed , maintains a healthy diet and regular exercise regimen , and following up as directed.  Pt currently on metoprolol 25mg  bid and also takes toresemide 20mg   Pt has a history of chronic diastolic CHF and CAD- she is trying to do a low sodium diet and currently on torsemide 20mg  and potassium supplements -  She is also on imdur 30mg  qd Pt with history of afib - is currently following with cardiology- she is currently on Eliquis 5mg  bid (also has history of PE in 07/2019) she follows with Dr Dulce Sellar and has a follow up appt later this fall  Pt with history of GERD - doing well on omeprazole 20mg   Pt with history of diabetes - she has never been on medication and recent hgb a1cs have been under 7.0 - she denies any problems  Pt would like to be scheduled for dexa scan and mammogram Pt would like flu shot today  Pt complains of chronic left knee pain - would like referral to orthopedics Past Medical History:  Diagnosis Date   Angina pectoris (HCC) 02/18/2018   CAD in native artery    by CT coronary 10/2018 >> medical therapy    CHF (congestive heart failure), NYHA class I, chronic, diastolic (HCC) 08/09/2019    Chronic diastolic (congestive) heart failure (HCC)    Coronary artery disease of native artery of native heart with stable angina pectoris (HCC) 08/09/2019   COVID-19 virus infection    Encounter for vaccination 10/03/2020   Family history of coronary artery disease 01/30/2018   Gastro-esophageal reflux disease without esophagitis 01/30/2018   GERD (gastroesophageal reflux disease)    Health maintenance examination 10/03/2020   History of pulmonary embolism 08/23/2019   Hyperlipidemia    Hypertension    Hypertensive heart disease with heart failure (HCC) 01/30/2018   Mixed hyperlipidemia 02/18/2018   New onset atrial fibrillation (HCC)    Other fatigue 02/10/2020   Other specified menopausal and perimenopausal disorders 02/10/2020   PAD (peripheral artery disease) (HCC) 08/28/2018   PAH (pulmonary artery hypertension) (HCC) 08/28/2018   Pre-diabetes    Prediabetes 02/10/2020   Pulmonary embolus (HCC)    Pulmonary hypertension (HCC)    Vitamin D deficiency 08/09/2019    Past Surgical History:  Procedure Laterality Date   ABDOMINAL HYSTERECTOMY     CATARACT EXTRACTION Left 03/2014   CATARACT EXTRACTION Right 05/2014   COLONOSCOPY  11/05/2013    Family History  Problem Relation Age of Onset   Heart disease Father     Social History   Socioeconomic History   Marital status: Widowed    Spouse name: Not on file   Number of children: 1   Years of education: Not on file   Highest education level:  Not on file  Occupational History   Occupation: retired  Tobacco Use   Smoking status: Never   Smokeless tobacco: Never  Vaping Use   Vaping status: Never Used  Substance and Sexual Activity   Alcohol use: Never   Drug use: Never   Sexual activity: Not on file  Other Topics Concern   Not on file  Social History Narrative   Not on file   Social Determinants of Health   Financial Resource Strain: Low Risk  (01/09/2023)   Overall Financial Resource Strain (CARDIA)    Difficulty of Paying  Living Expenses: Not hard at all  Recent Concern: Financial Resource Strain - High Risk (10/24/2022)   Overall Financial Resource Strain (CARDIA)    Difficulty of Paying Living Expenses: Hard  Food Insecurity: No Food Insecurity (01/09/2023)   Hunger Vital Sign    Worried About Running Out of Food in the Last Year: Never true    Ran Out of Food in the Last Year: Never true  Transportation Needs: No Transportation Needs (01/09/2023)   PRAPARE - Administrator, Civil Service (Medical): No    Lack of Transportation (Non-Medical): No  Physical Activity: Inactive (01/09/2023)   Exercise Vital Sign    Days of Exercise per Week: 0 days    Minutes of Exercise per Session: 0 min  Stress: No Stress Concern Present (01/09/2023)   [employer] of Occupational Health - Occupational Stress Questionnaire    Feeling of Stress : Not at all  Social Connections: Moderately Integrated (01/09/2023)   Social Connection and Isolation Panel [NHANES]    Frequency of Communication with Friends and Family: More than three times a week    Frequency of Social Gatherings with Friends and Family: More than three times a week    Attends Religious Services: More than 4 times per year    Active Member of [employer] or Organizations: Yes    Attends Banker Meetings: More than 4 times per year    Marital Status: Widowed  Intimate Partner Violence: Not At Risk (01/09/2023)   Humiliation, Afraid, Rape, and Kick questionnaire    Fear of Current or Ex-Partner: No    Emotionally Abused: No    Physically Abused: No    Sexually Abused: No     Current Outpatient Medications:    apixaban (ELIQUIS) 5 MG TABS tablet, Take 1 tablet (5 mg total) by mouth 2 (two) times daily., Disp: 60 tablet, Rfl: 2   Ascorbic Acid (VITAMIN C) 100 MG tablet, Take 100 mg by mouth daily., Disp: , Rfl:    atorvastatin (LIPITOR) 20 MG tablet, TAKE 1 TABLET BY MOUTH EVERY DAY, Disp: 90 tablet, Rfl: 1   calcium carbonate (OSCAL)  1500 (600 Ca) MG TABS tablet, Take 600 mg of elemental calcium by mouth daily., Disp: , Rfl:    Cholecalciferol (VITAMIN D3 PO), Take 2 capsules by mouth daily., Disp: , Rfl:    isosorbide mononitrate (IMDUR) 30 MG 24 hr tablet, Take 1 tablet (30 mg total) by mouth daily., Disp: 90 tablet, Rfl: 3   loratadine (CLARITIN) 10 MG tablet, Take 1 tablet (10 mg total) by mouth daily as needed for allergies., Disp: 90 tablet, Rfl: 0   metoprolol tartrate (LOPRESSOR) 25 MG tablet, TAKE 1 TABLET(25 MG) BY MOUTH TWICE DAILY, Disp: 180 tablet, Rfl: 3   Multiple Vitamin (MULTIVITAMIN WITH MINERALS) TABS tablet, Take 1 tablet by mouth daily., Disp: , Rfl:    Multiple Vitamins-Minerals (OCUVITE PRESERVISION PO), Take  1 tablet by mouth in the morning and at bedtime., Disp: , Rfl:    omeprazole (PRILOSEC) 20 MG capsule, TAKE 1 CAPSULE BY MOUTH EVERY DAY, Disp: 90 capsule, Rfl: 1   potassium chloride SA (KLOR-CON M20) 20 MEQ tablet, TAKE 1 TABLET BY MOUTH EVERY DAY, Disp: 90 tablet, Rfl: 0   torsemide (DEMADEX) 20 MG tablet, TAKE 1 TABLET BY MOUTH EVERY DAY, Disp: 90 tablet, Rfl: 0   triamcinolone (NASACORT) 55 MCG/ACT AERO nasal inhaler, Place 1 spray into the nose daily as needed for allergies (allergies)., Disp: , Rfl:    vitamin E 200 UNIT capsule, Take 200 Units by mouth daily., Disp: , Rfl:    nitroGLYCERIN (NITROSTAT) 0.4 MG SL tablet, Place 1 tablet (0.4 mg total) under the tongue every 5 (five) minutes as needed for chest pain., Disp: 90 tablet, Rfl: 3   Allergies  Allergen Reactions   Clarithromycin     Numbness of extremities   Codeine Other (See Comments)    Caused pain   Penicillins Rash    Did it involve swelling of the face/tongue/throat, SOB, or low BP? No Did it involve sudden or severe rash/hives, skin peeling, or any reaction on the inside of your mouth or nose? Yes Did you need to seek medical attention at a hospital or doctor's office? Yes When did it last happen?      20 years If all  above answers are "NO", may proceed with cephalosporin use.    CONSTITUTIONAL: Negative for chills, fatigue, fever, unintentional weight gain and unintentional weight loss.  E/N/T: Negative for ear pain, nasal congestion and sore throat.  CARDIOVASCULAR: Negative for chest pain, dizziness, palpitations and pedal edema.  RESPIRATORY: Negative for recent cough and dyspnea.  GASTROINTESTINAL: Negative for abdominal pain, acid reflux symptoms, constipation, diarrhea, nausea and vomiting.  MSK: see HPI INTEGUMENTARY: Negative for rash.  NEUROLOGICAL: Negative for dizziness and headaches.  PSYCHIATRIC: Negative for sleep disturbance and to question depression screen.  Negative for depression, negative for anhedonia.        Objective:  PHYSICAL EXAM:   VS: BP 118/70 (BP Location: Left Arm, Patient Position: Sitting, Cuff Size: Large)   Pulse 83   Temp (!) 97.5 F (36.4 C) (Temporal)   Ht 5' 5.5" (1.664 m)   Wt 251 lb (113.9 kg)   SpO2 100%   BMI 41.13 kg/m   GEN: Well nourished, well developed, in no acute distress  Cardiac: RRR; no murmurs, rubs, or gallops,no edema -  Respiratory:  normal respiratory rate and pattern with no distress - normal breath sounds with no rales, rhonchi, wheezes or rubs GI: normal bowel sounds, no masses or tenderness MS: left knee - swollen, tender to palpation Skin: warm and dry, no rash  Neuro:  Alert and Oriented x 3,  CN II-Xii grossly intact Psych: euthymic mood, appropriate affect and demeanor   Health Maintenance Due  Topic Date Due   OPHTHALMOLOGY EXAM  Never done   DTaP/Tdap/Td (1 - Tdap) Never done   DEXA SCAN  02/24/2022   MAMMOGRAM  03/16/2023    There are no preventive care reminders to display for this patient.  Lab Results  Component Value Date   TSH 2.490 12/11/2022   Lab Results  Component Value Date   WBC 4.1 11/20/2022   HGB 13.5 11/20/2022   HCT 41.2 11/20/2022   MCV 86 11/20/2022   PLT 183 11/20/2022   Lab Results   Component Value Date   NA 140 11/20/2022  K 3.9 11/20/2022   CO2 24 11/20/2022   GLUCOSE 115 (H) 11/20/2022   BUN 19 11/20/2022   CREATININE 1.07 (H) 11/20/2022   BILITOT 1.0 11/20/2022   ALKPHOS 146 (H) 11/20/2022   AST 16 11/20/2022   ALT 13 11/20/2022   PROT 7.0 11/20/2022   ALBUMIN 4.2 11/20/2022   CALCIUM 9.8 11/20/2022   ANIONGAP 10 07/29/2019   EGFR 53 (L) 11/20/2022   Lab Results  Component Value Date   CHOL 155 11/20/2022   Lab Results  Component Value Date   HDL 48 11/20/2022   Lab Results  Component Value Date   LDLCALC 86 11/20/2022   Lab Results  Component Value Date   TRIG 118 11/20/2022   Lab Results  Component Value Date   CHOLHDL 3.2 11/20/2022   Lab Results  Component Value Date   HGBA1C 6.3 (H) 11/20/2022      Assessment & Plan:   Problem List Items Addressed This Visit       Cardiovascular and Mediastinum   Hypertensive heart disease with heart failure (HCC)   Relevant Orders   CBC with Differential/Platelet   Comprehensive metabolic panel Continue current meds Follow up with cardiology as directed     Digestive   Gastro-esophageal reflux disease without esophagitis   Relevant Medications   omeprazole (PRILOSEC) 20 MG capsule Continue meds     Other   Hyperlipidemia associated with diabetes (HCC)   Relevant Orders   Lipid panel Watch diet and continue meds   Vitamin D deficiency   Relevant Orders   VITAMIN D 25 Hydroxy (Vit-D Deficiency, Fractures)   Diabetes without complication (HCC)- diet controlled   Relevant Orders   Hemoglobin A1c Continue to watch diet  Atrial fibrillation Continue current meds Follow up with cardiology as directed  Chronic knee pain Referral to ortho  Need flu vaccine Fluad given  Breast cancer screening Mammogram scheduled   Other Visit Diagnoses         No orders of the defined types were placed in this encounter.    Follow-up: Return in about 4 months (around 07/28/2023)  for chronic fasting follow-up.    SARA R Evie Croston, PA-C

## 2023-03-27 NOTE — Addendum Note (Signed)
Addended by: Marianne Sofia on: 03/27/2023 12:10 PM   Modules accepted: Level of Service

## 2023-03-28 ENCOUNTER — Other Ambulatory Visit: Payer: Self-pay | Admitting: Cardiology

## 2023-03-28 ENCOUNTER — Other Ambulatory Visit: Payer: Self-pay | Admitting: Physician Assistant

## 2023-03-28 DIAGNOSIS — K219 Gastro-esophageal reflux disease without esophagitis: Secondary | ICD-10-CM

## 2023-03-28 LAB — CBC WITH DIFFERENTIAL/PLATELET
Basophils Absolute: 0 10*3/uL (ref 0.0–0.2)
Basos: 1 %
EOS (ABSOLUTE): 0.1 10*3/uL (ref 0.0–0.4)
Eos: 3 %
Hematocrit: 42.4 % (ref 34.0–46.6)
Hemoglobin: 13.4 g/dL (ref 11.1–15.9)
Immature Grans (Abs): 0 10*3/uL (ref 0.0–0.1)
Immature Granulocytes: 0 %
Lymphocytes Absolute: 0.7 10*3/uL (ref 0.7–3.1)
Lymphs: 19 %
MCH: 28.8 pg (ref 26.6–33.0)
MCHC: 31.6 g/dL (ref 31.5–35.7)
MCV: 91 fL (ref 79–97)
Monocytes Absolute: 0.4 10*3/uL (ref 0.1–0.9)
Monocytes: 11 %
Neutrophils Absolute: 2.5 10*3/uL (ref 1.4–7.0)
Neutrophils: 66 %
Platelets: 191 10*3/uL (ref 150–450)
RBC: 4.66 x10E6/uL (ref 3.77–5.28)
RDW: 14.3 % (ref 11.7–15.4)
WBC: 3.8 10*3/uL (ref 3.4–10.8)

## 2023-03-28 LAB — LIPID PANEL
Chol/HDL Ratio: 3 {ratio} (ref 0.0–4.4)
Cholesterol, Total: 165 mg/dL (ref 100–199)
HDL: 55 mg/dL (ref >39)
LDL Chol Calc (NIH): 89 mg/dL (ref 0–99)
Triglycerides: 119 mg/dL (ref 0–149)
VLDL Cholesterol Cal: 21 mg/dL (ref 5–40)

## 2023-03-28 LAB — COMPREHENSIVE METABOLIC PANEL
ALT: 10 [IU]/L (ref 0–32)
AST: 19 [IU]/L (ref 0–40)
Albumin: 4.1 g/dL (ref 3.8–4.8)
Alkaline Phosphatase: 149 [IU]/L — ABNORMAL HIGH (ref 44–121)
BUN/Creatinine Ratio: 19 (ref 12–28)
BUN: 18 mg/dL (ref 8–27)
Bilirubin Total: 1.2 mg/dL (ref 0.0–1.2)
CO2: 27 mmol/L (ref 20–29)
Calcium: 10.2 mg/dL (ref 8.7–10.3)
Chloride: 99 mmol/L (ref 96–106)
Creatinine, Ser: 0.93 mg/dL (ref 0.57–1.00)
Globulin, Total: 2.8 g/dL (ref 1.5–4.5)
Glucose: 116 mg/dL — ABNORMAL HIGH (ref 70–99)
Potassium: 4.4 mmol/L (ref 3.5–5.2)
Sodium: 142 mmol/L (ref 134–144)
Total Protein: 6.9 g/dL (ref 6.0–8.5)
eGFR: 62 mL/min/{1.73_m2} (ref >59)

## 2023-03-28 LAB — HEMOGLOBIN A1C
Est. average glucose Bld gHb Est-mCnc: 143 mg/dL
Hgb A1c MFr Bld: 6.6 % — ABNORMAL HIGH (ref 4.8–5.6)

## 2023-03-28 LAB — VITAMIN D 25 HYDROXY (VIT D DEFICIENCY, FRACTURES): Vit D, 25-Hydroxy: 50.5 ng/mL (ref 30.0–100.0)

## 2023-04-01 DIAGNOSIS — H43813 Vitreous degeneration, bilateral: Secondary | ICD-10-CM | POA: Diagnosis not present

## 2023-04-01 DIAGNOSIS — H353211 Exudative age-related macular degeneration, right eye, with active choroidal neovascularization: Secondary | ICD-10-CM | POA: Diagnosis not present

## 2023-04-01 DIAGNOSIS — H353121 Nonexudative age-related macular degeneration, left eye, early dry stage: Secondary | ICD-10-CM | POA: Diagnosis not present

## 2023-04-10 ENCOUNTER — Other Ambulatory Visit: Payer: Self-pay

## 2023-04-10 DIAGNOSIS — N958 Other specified menopausal and perimenopausal disorders: Secondary | ICD-10-CM

## 2023-04-10 DIAGNOSIS — Z1231 Encounter for screening mammogram for malignant neoplasm of breast: Secondary | ICD-10-CM

## 2023-05-13 DIAGNOSIS — L578 Other skin changes due to chronic exposure to nonionizing radiation: Secondary | ICD-10-CM | POA: Diagnosis not present

## 2023-05-13 DIAGNOSIS — L821 Other seborrheic keratosis: Secondary | ICD-10-CM | POA: Diagnosis not present

## 2023-05-13 DIAGNOSIS — D0472 Carcinoma in situ of skin of left lower limb, including hip: Secondary | ICD-10-CM | POA: Diagnosis not present

## 2023-06-03 DIAGNOSIS — H43813 Vitreous degeneration, bilateral: Secondary | ICD-10-CM | POA: Diagnosis not present

## 2023-06-03 DIAGNOSIS — H353211 Exudative age-related macular degeneration, right eye, with active choroidal neovascularization: Secondary | ICD-10-CM | POA: Diagnosis not present

## 2023-06-03 DIAGNOSIS — H353124 Nonexudative age-related macular degeneration, left eye, advanced atrophic with subfoveal involvement: Secondary | ICD-10-CM | POA: Diagnosis not present

## 2023-07-14 ENCOUNTER — Telehealth: Payer: Self-pay

## 2023-07-14 NOTE — Telephone Encounter (Signed)
Copied from CRM [id number]. Topic: Clinical - Request for Lab/Test Order >> Jul 14, 2023  4:41 PM Victorino Dike T wrote: Reason for CRM: would like a mammogram scheduled, having some discomfort and found a dark spot on breast and it is growing.  Please call patient [phone]

## 2023-07-14 NOTE — Telephone Encounter (Signed)
She has an appt 02/03 with Kennon Rounds, does she need an acute appt for this issue alone with a provider sooner than that appt.

## 2023-07-15 ENCOUNTER — Other Ambulatory Visit: Payer: Self-pay | Admitting: Physician Assistant

## 2023-07-15 NOTE — Telephone Encounter (Signed)
Patient has an appointment Wednesday. 

## 2023-07-16 ENCOUNTER — Ambulatory Visit: Payer: Medicare Other

## 2023-07-18 ENCOUNTER — Ambulatory Visit (INDEPENDENT_AMBULATORY_CARE_PROVIDER_SITE_OTHER): Payer: Medicare Other

## 2023-07-18 VITALS — BP 120/66 | HR 98 | Temp 97.7°F | Ht 65.5 in | Wt 256.2 lb

## 2023-07-18 DIAGNOSIS — Z1231 Encounter for screening mammogram for malignant neoplasm of breast: Secondary | ICD-10-CM | POA: Diagnosis not present

## 2023-07-18 DIAGNOSIS — L988 Other specified disorders of the skin and subcutaneous tissue: Secondary | ICD-10-CM

## 2023-07-18 NOTE — Progress Notes (Unsigned)
Acute Office Visit  Subjective:    Patient ID: Miranda Moses, female    DOB: 02/03/1941, 83 y.o.   MRN: [id number]  Chief Complaint  Patient presents with   Breast Pain    Discussed the use of AI scribe software for clinical note transcription with the patient, who gave verbal consent to proceed.      HPI: Patient is in today for left breast pain. Patinet states they are a mole tha thas gotten bigger. Patient states it feel "tingle" and been going on since November 2024. The patient, with a history of atrial fibrillation and skin cancer, presents with concerns about a long-standing mole on her breast that has recently increased in size. She reports some discomfort in the area, but is unsure if this is due to her bra. She has not had a mammogram within the last year. The patient also mentions a history of skin cancer removed from her leg, and expresses concern about the possibility of recurrence in a new location. She denies any pain in the breast itself. The patient also reports a change in heart rhythm following a recent bout of COVID-19.  She is also hoping to have her mammogram.   Denies any other concerns  Past Medical History:  Diagnosis Date   Angina pectoris (HCC) 02/18/2018   CAD in native artery    by CT coronary 10/2018 >> medical therapy    CHF (congestive heart failure), NYHA class I, chronic, diastolic (HCC) 08/09/2019   Chronic diastolic (congestive) heart failure (HCC)    Coronary artery disease of native artery of native heart with stable angina pectoris (HCC) 08/09/2019   COVID-19 virus infection    Encounter for vaccination 10/03/2020   Family history of coronary artery disease 01/30/2018   Gastro-esophageal reflux disease without esophagitis 01/30/2018   GERD (gastroesophageal reflux disease)    Health maintenance examination 10/03/2020   History of pulmonary embolism 08/23/2019   Hyperlipidemia    Hypertension    Hypertensive heart disease with heart failure (HCC)  01/30/2018   Mixed hyperlipidemia 02/18/2018   New onset atrial fibrillation (HCC)    Other fatigue 02/10/2020   Other specified menopausal and perimenopausal disorders 02/10/2020   PAD (peripheral artery disease) (HCC) 08/28/2018   PAH (pulmonary artery hypertension) (HCC) 08/28/2018   Pre-diabetes    Prediabetes 02/10/2020   Pulmonary embolus (HCC)    Pulmonary hypertension (HCC)    Vitamin D deficiency 08/09/2019    Past Surgical History:  Procedure Laterality Date   ABDOMINAL HYSTERECTOMY     CATARACT EXTRACTION Left 03/2014   CATARACT EXTRACTION Right 05/2014   COLONOSCOPY  11/05/2013    Family History  Problem Relation Age of Onset   Heart disease Father     Social History   Socioeconomic History   Marital status: Widowed    Spouse name: Not on file   Number of children: 1   Years of education: Not on file   Highest education level: Not on file  Occupational History   Occupation: retired  Tobacco Use   Smoking status: Never   Smokeless tobacco: Never  Vaping Use   Vaping status: Never Used  Substance and Sexual Activity   Alcohol use: Never   Drug use: Never   Sexual activity: Not on file  Other Topics Concern   Not on file  Social History Narrative   Not on file   Social Drivers of Health   Financial Resource Strain: Low Risk  (01/09/2023)   Overall Financial  Resource Strain (CARDIA)    Difficulty of Paying Living Expenses: Not hard at all  Recent Concern: Financial Resource Strain - High Risk (10/24/2022)   Overall Financial Resource Strain (CARDIA)    Difficulty of Paying Living Expenses: Hard  Food Insecurity: No Food Insecurity (01/09/2023)   Hunger Vital Sign    Worried About Running Out of Food in the Last Year: Never true    Ran Out of Food in the Last Year: Never true  Transportation Needs: No Transportation Needs (01/09/2023)   PRAPARE - Administrator, Civil Service (Medical): No    Lack of Transportation (Non-Medical): No  Physical  Activity: Inactive (01/09/2023)   Exercise Vital Sign    Days of Exercise per Week: 0 days    Minutes of Exercise per Session: 0 min  Stress: No Stress Concern Present (01/09/2023)   [employer] of Occupational Health - Occupational Stress Questionnaire    Feeling of Stress : Not at all  Social Connections: Moderately Integrated (01/09/2023)   Social Connection and Isolation Panel [NHANES]    Frequency of Communication with Friends and Family: More than three times a week    Frequency of Social Gatherings with Friends and Family: More than three times a week    Attends Religious Services: More than 4 times per year    Active Member of [employer] or Organizations: Yes    Attends Banker Meetings: More than 4 times per year    Marital Status: Widowed  Intimate Partner Violence: Not At Risk (01/09/2023)   Humiliation, Afraid, Rape, and Kick questionnaire    Fear of Current or Ex-Partner: No    Emotionally Abused: No    Physically Abused: No    Sexually Abused: No    Outpatient Medications Prior to Visit  Medication Sig Dispense Refill   Aflibercept (EYLEA) 2 MG/0.05ML SOLN 2 mg by Intravitreal route as needed. Every nine weeks     Ascorbic Acid (VITAMIN C) 100 MG tablet Take 100 mg by mouth daily.     atorvastatin (LIPITOR) 20 MG tablet TAKE 1 TABLET BY MOUTH EVERY DAY 90 tablet 1   calcium carbonate (OSCAL) 1500 (600 Ca) MG TABS tablet Take 600 mg of elemental calcium by mouth daily.     Cholecalciferol (VITAMIN D3 PO) Take 2 capsules by mouth daily.     ELIQUIS 5 MG TABS tablet TAKE 1 TABLET BY MOUTH TWICE A DAY 60 tablet 2   isosorbide mononitrate (IMDUR) 30 MG 24 hr tablet TAKE 1 TABLET BY MOUTH EVERY DAY 90 tablet 3   loratadine (CLARITIN) 10 MG tablet Take 1 tablet (10 mg total) by mouth daily as needed for allergies. 90 tablet 0   metoprolol tartrate (LOPRESSOR) 25 MG tablet TAKE 1 TABLET(25 MG) BY MOUTH TWICE DAILY 180 tablet 3   Multiple Vitamin (MULTIVITAMIN WITH  MINERALS) TABS tablet Take 1 tablet by mouth daily.     Multiple Vitamins-Minerals (OCUVITE PRESERVISION PO) Take 1 tablet by mouth in the morning and at bedtime.     omeprazole (PRILOSEC) 20 MG capsule TAKE 1 CAPSULE BY MOUTH EVERY DAY 90 capsule 1   potassium chloride SA (KLOR-CON M20) 20 MEQ tablet TAKE 1 TABLET BY MOUTH EVERY DAY 90 tablet 0   torsemide (DEMADEX) 20 MG tablet TAKE 1 TABLET BY MOUTH EVERY DAY 90 tablet 0   triamcinolone (NASACORT) 55 MCG/ACT AERO nasal inhaler Place 1 spray into the nose daily as needed for allergies (allergies).     vitamin  E 200 UNIT capsule Take 200 Units by mouth daily.     nitroGLYCERIN (NITROSTAT) 0.4 MG SL tablet Place 1 tablet (0.4 mg total) under the tongue every 5 (five) minutes as needed for chest pain. 90 tablet 3   No facility-administered medications prior to visit.    Allergies  Allergen Reactions   Clarithromycin     Numbness of extremities   Codeine Other (See Comments)    Caused pain   Penicillins Rash    Did it involve swelling of the face/tongue/throat, SOB, or low BP? No Did it involve sudden or severe rash/hives, skin peeling, or any reaction on the inside of your mouth or nose? Yes Did you need to seek medical attention at a hospital or doctor's office? Yes When did it last happen?      20 years If all above answers are "NO", may proceed with cephalosporin use.     Review of Systems  Constitutional:  Negative for chills, fatigue and fever.  HENT:  Negative for congestion, ear pain and sinus pain.   Respiratory:  Negative for cough and shortness of breath.   Cardiovascular:  Negative for chest pain.  Gastrointestinal:  Negative for abdominal pain, constipation, diarrhea, nausea and vomiting.  Musculoskeletal:  Negative for myalgias.  Skin:        Mole on her left breast  Neurological:  Negative for headaches.       Objective:        07/18/2023   11:25 AM 03/27/2023   11:20 AM 01/09/2023    3:03 PM  Vitals with BMI   Height 5' 5.5" 5' 5.5" 5' 5.5"  Weight 256 lbs 3 oz 251 lbs 248 lbs  BMI 41.97 41.12 40.63  Systolic 120 118 --  Diastolic 66 70 --  Pulse 98 83     No data found.   Physical Exam Vitals and nursing note reviewed.  Constitutional:      Appearance: Normal appearance.     Comments: CHEST: Lungs clear to auscultation. CARDIOVASCULAR: Heart rhythm irregular. BREAST: Bilateral breasts appear similar with no abnormalities detected upon palpation. No pain or abnormalities in left breast. SKIN: Mole on left breast, lateral to the nipple, enlarged, resembling seborrheic keratosis, approximately a couple centimeters in size.  Neurological:     Mental Status: She is alert.        Health Maintenance Due  Topic Date Due   OPHTHALMOLOGY EXAM  Never done   Diabetic kidney evaluation - Urine ACR  Never done   DEXA SCAN  02/24/2022   MAMMOGRAM  03/16/2023    There are no preventive care reminders to display for this patient.   Lab Results  Component Value Date   TSH 2.490 12/11/2022   Lab Results  Component Value Date   WBC 3.8 03/27/2023   HGB 13.4 03/27/2023   HCT 42.4 03/27/2023   MCV 91 03/27/2023   PLT 191 03/27/2023   Lab Results  Component Value Date   NA 142 03/27/2023   K 4.4 03/27/2023   CO2 27 03/27/2023   GLUCOSE 116 (H) 03/27/2023   BUN 18 03/27/2023   CREATININE 0.93 03/27/2023   BILITOT 1.2 03/27/2023   ALKPHOS 149 (H) 03/27/2023   AST 19 03/27/2023   ALT 10 03/27/2023   PROT 6.9 03/27/2023   ALBUMIN 4.1 03/27/2023   CALCIUM 10.2 03/27/2023   ANIONGAP 10 07/29/2019   EGFR 62 03/27/2023   Lab Results  Component Value Date   CHOL 165 03/27/2023  Lab Results  Component Value Date   HDL 55 03/27/2023   Lab Results  Component Value Date   LDLCALC 89 03/27/2023   Lab Results  Component Value Date   TRIG 119 03/27/2023   Lab Results  Component Value Date   CHOLHDL 3.0 03/27/2023   Lab Results  Component Value Date   HGBA1C 6.6 (H)  03/27/2023       Assessment & Plan:  Encounter for screening mammogram for malignant neoplasm of breast Assessment & Plan: Ordered screening mammogram as per her request  Orders: -     3D Screening Mammogram, Left and Right; Future  Skin lesion of breast Assessment & Plan: Longstanding mole on the left breast has increased in size and caused discomfort. Recent mammogram (September 2023) was normal. Physical exam reveals a lesion consistent with seborrheic keratosis, which is benign. No palpable abnormalities in breast tissue. Discussed that seborrheic keratosis is typically benign and does not usually require treatment unless it changes in color or size. Explained that if the lesion changes or turns darker, a biopsy may be necessary to rule out malignancy. - Order mammogram at [facility] - Document lesion with photograph in chart - Monitor lesion for changes in color or size - Consider biopsy if lesion changes or darkens       No orders of the defined types were placed in this encounter.   Orders Placed This Encounter  Procedures   MM 3D SCREENING MAMMOGRAM BILATERAL BREAST     Follow-up: No follow-ups on file.  An After Visit Summary was printed and given to the patient.  Windell Moment, MD [facility] [phone]

## 2023-07-18 NOTE — Patient Instructions (Signed)
VISIT SUMMARY:  During today's visit, we discussed your concerns about a mole on your breast that has recently increased in size and caused some discomfort. We also reviewed your history of atrial fibrillation and recent changes in heart rhythm following a bout of COVID-19. Additionally, we went over your general health maintenance, including your blood pressure management.  YOUR PLAN:  -BREAST LESION: You have a mole on your left breast that has increased in size. This lesion appears to be seborrheic keratosis, which is a benign skin growth. We will monitor the lesion for any changes in color or size, and if it changes or darkens, a biopsy may be necessary. A mammogram has been ordered at [facility], and we have documented the lesion with a photograph in your chart.  -ATRIAL FIBRILLATION: Atrial fibrillation is an irregular and often rapid heart rate. You are currently taking Eliquis to prevent blood clots. Please continue taking Eliquis as prescribed and avoid using an abdominal toner device near your heart, as it may worsen palpitations. Monitor for any new symptoms or worsening of your condition.  -GENERAL HEALTH MAINTENANCE: Your blood pressure is well-controlled. Please continue to monitor your blood pressure regularly to ensure it remains within a healthy range.  INSTRUCTIONS:  Please notify us of your mammogram results once you receive them. Return to the clinic if you notice any changes in the breast lesion or if you experience any new or worsening symptoms related to your atrial fibrillation.

## 2023-07-20 DIAGNOSIS — Z1231 Encounter for screening mammogram for malignant neoplasm of breast: Secondary | ICD-10-CM | POA: Insufficient documentation

## 2023-07-20 DIAGNOSIS — L988 Other specified disorders of the skin and subcutaneous tissue: Secondary | ICD-10-CM | POA: Insufficient documentation

## 2023-07-20 NOTE — Assessment & Plan Note (Signed)
Longstanding mole on the left breast has increased in size and caused discomfort. Recent mammogram (September 2023) was normal. Physical exam reveals a lesion consistent with seborrheic keratosis, which is benign. No palpable abnormalities in breast tissue. Discussed that seborrheic keratosis is typically benign and does not usually require treatment unless it changes in color or size. Explained that if the lesion changes or turns darker, a biopsy may be necessary to rule out malignancy. - Order mammogram at [facility] - Document lesion with photograph in chart - Monitor lesion for changes in color or size - Consider biopsy if lesion changes or darkens

## 2023-07-20 NOTE — Assessment & Plan Note (Signed)
Ordered screening mammogram as per her request

## 2023-07-28 ENCOUNTER — Ambulatory Visit: Payer: Medicare Other | Admitting: Physician Assistant

## 2023-07-28 ENCOUNTER — Other Ambulatory Visit: Payer: Self-pay | Admitting: Physician Assistant

## 2023-07-28 ENCOUNTER — Encounter: Payer: Self-pay | Admitting: Physician Assistant

## 2023-07-28 VITALS — BP 128/70 | HR 93 | Temp 97.7°F | Resp 16 | Ht 65.5 in | Wt 253.0 lb

## 2023-07-28 DIAGNOSIS — I739 Peripheral vascular disease, unspecified: Secondary | ICD-10-CM

## 2023-07-28 DIAGNOSIS — N958 Other specified menopausal and perimenopausal disorders: Secondary | ICD-10-CM

## 2023-07-28 DIAGNOSIS — R058 Other specified cough: Secondary | ICD-10-CM | POA: Insufficient documentation

## 2023-07-28 DIAGNOSIS — L819 Disorder of pigmentation, unspecified: Secondary | ICD-10-CM | POA: Insufficient documentation

## 2023-07-28 DIAGNOSIS — N644 Mastodynia: Secondary | ICD-10-CM | POA: Diagnosis not present

## 2023-07-28 DIAGNOSIS — J309 Allergic rhinitis, unspecified: Secondary | ICD-10-CM | POA: Insufficient documentation

## 2023-07-28 DIAGNOSIS — Z1231 Encounter for screening mammogram for malignant neoplasm of breast: Secondary | ICD-10-CM | POA: Insufficient documentation

## 2023-07-28 DIAGNOSIS — E1169 Type 2 diabetes mellitus with other specified complication: Secondary | ICD-10-CM | POA: Diagnosis not present

## 2023-07-28 DIAGNOSIS — E782 Mixed hyperlipidemia: Secondary | ICD-10-CM | POA: Diagnosis not present

## 2023-07-28 DIAGNOSIS — J3089 Other allergic rhinitis: Secondary | ICD-10-CM

## 2023-07-28 DIAGNOSIS — E119 Type 2 diabetes mellitus without complications: Secondary | ICD-10-CM

## 2023-07-28 DIAGNOSIS — I110 Hypertensive heart disease with heart failure: Secondary | ICD-10-CM

## 2023-07-28 DIAGNOSIS — K219 Gastro-esophageal reflux disease without esophagitis: Secondary | ICD-10-CM

## 2023-07-28 DIAGNOSIS — I4811 Longstanding persistent atrial fibrillation: Secondary | ICD-10-CM

## 2023-07-28 DIAGNOSIS — I5032 Chronic diastolic (congestive) heart failure: Secondary | ICD-10-CM | POA: Diagnosis not present

## 2023-07-28 DIAGNOSIS — M25561 Pain in right knee: Secondary | ICD-10-CM | POA: Diagnosis not present

## 2023-07-28 DIAGNOSIS — E559 Vitamin D deficiency, unspecified: Secondary | ICD-10-CM

## 2023-07-28 DIAGNOSIS — G8929 Other chronic pain: Secondary | ICD-10-CM | POA: Insufficient documentation

## 2023-07-28 MED ORDER — LORATADINE 10 MG PO TABS: 10.0000 mg | ORAL_TABLET | Freq: Every day | ORAL | 1 refills | Status: DC | PRN

## 2023-07-28 MED ORDER — APIXABAN 5 MG PO TABS: 5.0000 mg | ORAL_TABLET | Freq: Two times a day (BID) | ORAL | 2 refills | Status: DC

## 2023-07-28 NOTE — Progress Notes (Signed)
Established Patient Office Visit  Subjective:  Patient ID: Miranda Moses, female    DOB: 1940/11/23  Age: 83 y.o. MRN: [id number]  CC:  Chief Complaint  Patient presents with   Medical Management of Chronic Issues    HPI Miranda Moses presents for chronic follow up hyperlipidemia  Mixed hyperlipidemia  Pt presents with hyperlipidemia.  Compliance with treatment has been good; The patient is compliant with medications, maintains a low cholesterol diet , follows up as directed ,  . The patient denies experiencing any hypercholesterolemia related symptoms. . Pt currently on atorvastatin 20mg  qd  Pt presents for follow up of hypertension.  The patient is tolerating the medication well without side effects. Compliance with treatment has been good; including taking medication as directed , maintains a healthy diet and regular exercise regimen , and following up as directed.  Pt currently on metoprolol 25mg  bid and also takes toresemide 20mg   Pt has a history of chronic diastolic CHF and CAD- she is trying to do a low sodium diet and currently on torsemide 20mg  and potassium supplements -  She is also on imdur 30mg  qd Pt with history of afib - is currently following with cardiology- she is currently on Eliquis 5mg  bid (also has history of PE in 07/2019) she follows with Dr Dulce Sellar and has a follow up appt later this spring Denies chest pain/sob/edema  Pt with history of GERD - doing well on omeprazole 20mg   Pt with history of diabetes - she has never been on medication and recent hgb a1cs have been under 7.0 - she denies any problems  Pt complains of chronic left knee pain - would like referral to orthopedics  Pt with history 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  Has always had spot on her left breast but now it has gotten larger and slight change in color - would like referral to dermatologist for further evaluation Pt also mentions that she had seen another provider for left breast pain- she  continues to have intermittent soreness around the atypical skin lesion of breast --- she is due for screening mammogram but will also order diagnostic mammogram because of pain in breast Past Medical History:  Diagnosis Date   Angina pectoris (HCC) 02/18/2018   CAD in native artery    by CT coronary 10/2018 >> medical therapy    CHF (congestive heart failure), NYHA class I, chronic, diastolic (HCC) 08/09/2019   Chronic diastolic (congestive) heart failure (HCC)    Coronary artery disease of native artery of native heart with stable angina pectoris (HCC) 08/09/2019   COVID-19 virus infection    Encounter for vaccination 10/03/2020   Family history of coronary artery disease 01/30/2018   Gastro-esophageal reflux disease without esophagitis 01/30/2018   GERD (gastroesophageal reflux disease)    Health maintenance examination 10/03/2020   History of pulmonary embolism 08/23/2019   Hyperlipidemia    Hypertension    Hypertensive heart disease with heart failure (HCC) 01/30/2018   Mixed hyperlipidemia 02/18/2018   New onset atrial fibrillation (HCC)    Other fatigue 02/10/2020   Other specified menopausal and perimenopausal disorders 02/10/2020   PAD (peripheral artery disease) (HCC) 08/28/2018   PAH (pulmonary artery hypertension) (HCC) 08/28/2018   Pre-diabetes    Prediabetes 02/10/2020   Pulmonary embolus (HCC)    Pulmonary hypertension (HCC)    Vitamin D deficiency 08/09/2019    Past Surgical History:  Procedure Laterality Date   ABDOMINAL HYSTERECTOMY     CATARACT EXTRACTION Left 03/2014  CATARACT EXTRACTION Right 05/2014   COLONOSCOPY  11/05/2013    Family History  Problem Relation Age of Onset   Heart disease Father     Social History   Socioeconomic History   Marital status: Widowed    Spouse name: Not on file   Number of children: 1   Years of education: Not on file   Highest education level: Not on file  Occupational History   Occupation: retired  Tobacco Use   Smoking status:  Never   Smokeless tobacco: Never  Vaping Use   Vaping status: Never Used  Substance and Sexual Activity   Alcohol use: Never   Drug use: Never   Sexual activity: Not on file  Other Topics Concern   Not on file  Social History Narrative   Not on file   Social Drivers of Health   Financial Resource Strain: Low Risk  (01/09/2023)   Overall Financial Resource Strain (CARDIA)    Difficulty of Paying Living Expenses: Not hard at all  Recent Concern: Financial Resource Strain - High Risk (10/24/2022)   Overall Financial Resource Strain (CARDIA)    Difficulty of Paying Living Expenses: Hard  Food Insecurity: No Food Insecurity (01/09/2023)   Hunger Vital Sign    Worried About Running Out of Food in the Last Year: Never true    Ran Out of Food in the Last Year: Never true  Transportation Needs: No Transportation Needs (01/09/2023)   PRAPARE - Administrator, Civil Service (Medical): No    Lack of Transportation (Non-Medical): No  Physical Activity: Inactive (01/09/2023)   Exercise Vital Sign    Days of Exercise per Week: 0 days    Minutes of Exercise per Session: 0 min  Stress: No Stress Concern Present (01/09/2023)   [employer] of Occupational Health - Occupational Stress Questionnaire    Feeling of Stress : Not at all  Social Connections: Moderately Integrated (01/09/2023)   Social Connection and Isolation Panel [NHANES]    Frequency of Communication with Friends and Family: More than three times a week    Frequency of Social Gatherings with Friends and Family: More than three times a week    Attends Religious Services: More than 4 times per year    Active Member of [employer] or Organizations: Yes    Attends Banker Meetings: More than 4 times per year    Marital Status: Widowed  Intimate Partner Violence: Not At Risk (01/09/2023)   Humiliation, Afraid, Rape, and Kick questionnaire    Fear of Current or Ex-Partner: No    Emotionally Abused: No    Physically  Abused: No    Sexually Abused: No     Current Outpatient Medications:    Aflibercept (EYLEA) 2 MG/0.05ML SOLN, 2 mg by Intravitreal route as needed. Every nine weeks, Disp: , Rfl:    Ascorbic Acid (VITAMIN C) 100 MG tablet, Take 100 mg by mouth daily., Disp: , Rfl:    atorvastatin (LIPITOR) 20 MG tablet, TAKE 1 TABLET BY MOUTH EVERY DAY, Disp: 90 tablet, Rfl: 1   calcium carbonate (OSCAL) 1500 (600 Ca) MG TABS tablet, Take 600 mg of elemental calcium by mouth daily., Disp: , Rfl:    Cholecalciferol (VITAMIN D3 PO), Take 2 capsules by mouth daily., Disp: , Rfl:    isosorbide mononitrate (IMDUR) 30 MG 24 hr tablet, TAKE 1 TABLET BY MOUTH EVERY DAY, Disp: 90 tablet, Rfl: 3   metoprolol tartrate (LOPRESSOR) 25 MG tablet, TAKE 1 TABLET(25 MG)  BY MOUTH TWICE DAILY, Disp: 180 tablet, Rfl: 3   Multiple Vitamin (MULTIVITAMIN WITH MINERALS) TABS tablet, Take 1 tablet by mouth daily., Disp: , Rfl:    Multiple Vitamins-Minerals (OCUVITE PRESERVISION PO), Take 1 tablet by mouth in the morning and at bedtime., Disp: , Rfl:    omeprazole (PRILOSEC) 20 MG capsule, TAKE 1 CAPSULE BY MOUTH EVERY DAY, Disp: 90 capsule, Rfl: 1   potassium chloride SA (KLOR-CON M20) 20 MEQ tablet, TAKE 1 TABLET BY MOUTH EVERY DAY, Disp: 90 tablet, Rfl: 0   torsemide (DEMADEX) 20 MG tablet, TAKE 1 TABLET BY MOUTH EVERY DAY, Disp: 90 tablet, Rfl: 0   triamcinolone (NASACORT) 55 MCG/ACT AERO nasal inhaler, Place 1 spray into the nose daily as needed for allergies (allergies)., Disp: , Rfl:    vitamin E 200 UNIT capsule, Take 200 Units by mouth daily., Disp: , Rfl:    apixaban (ELIQUIS) 5 MG TABS tablet, Take 1 tablet (5 mg total) by mouth 2 (two) times daily., Disp: 60 tablet, Rfl: 2   loratadine (CLARITIN) 10 MG tablet, Take 1 tablet (10 mg total) by mouth daily as needed for allergies., Disp: 90 tablet, Rfl: 1   nitroGLYCERIN (NITROSTAT) 0.4 MG SL tablet, Place 1 tablet (0.4 mg total) under the tongue every 5 (five) minutes as  needed for chest pain., Disp: 90 tablet, Rfl: 3   Allergies  Allergen Reactions   Clarithromycin     Numbness of extremities   Codeine Other (See Comments)    Caused pain   Penicillins Rash    Did it involve swelling of the face/tongue/throat, SOB, or low BP? No Did it involve sudden or severe rash/hives, skin peeling, or any reaction on the inside of your mouth or nose? Yes Did you need to seek medical attention at a hospital or doctor's office? Yes When did it last happen?      20 years If all above answers are "NO", may proceed with cephalosporin use.    CONSTITUTIONAL: Negative for chills, fatigue, fever, unintentional weight gain and unintentional weight loss.  E/N/T: Negative for ear pain, nasal congestion and sore throat.  CARDIOVASCULAR: Negative for chest pain, dizziness, palpitations and pedal edema.  RESPIRATORY: Negative for recent cough and dyspnea.  GASTROINTESTINAL: Negative for abdominal pain, acid reflux symptoms, constipation, diarrhea, nausea and vomiting.  MSK: see HPI INTEGUMENTARY: see HPI NEUROLOGICAL: Negative for dizziness and headaches.  PSYCHIATRIC: Negative for sleep disturbance and to question depression screen.  Negative for depression, negative for anhedonia.        Objective:  PHYSICAL EXAM:   VS: BP 128/70 (BP Location: Left Arm, Patient Position: Sitting, Cuff Size: Normal)   Pulse 93   Temp 97.7 F (36.5 C) (Temporal)   Resp 16   Ht 5' 5.5" (1.664 m)   Wt 253 lb (114.8 kg)   LMP  (LMP Unknown)   SpO2 93%   BMI 41.46 kg/m   GEN: Well nourished, well developed, in no acute distress  Cardiac: RRR; no murmurs, rubs, or gallops,no edema - Respiratory:  normal respiratory rate and pattern with no distress - normal breath sounds with no rales, rhonchi, wheezes or rubs MS: no deformity or atrophy - walks with cane Breast - left breast with skin lesion noted - no other masses but does have palpable tenderness Skin: warm and dry, - large  lentigo noted on left breast Neuro:  Alert and Oriented x 3,  - CN II-Xii grossly intact Psych: euthymic mood, appropriate affect and demeanor  Health Maintenance Due  Topic Date Due   OPHTHALMOLOGY EXAM  Never done   Diabetic kidney evaluation - Urine ACR  Never done   DEXA SCAN  02/24/2022   MAMMOGRAM  03/16/2023    There are no preventive care reminders to display for this patient.  Lab Results  Component Value Date   TSH 2.490 12/11/2022   Lab Results  Component Value Date   WBC 3.8 03/27/2023   HGB 13.4 03/27/2023   HCT 42.4 03/27/2023   MCV 91 03/27/2023   PLT 191 03/27/2023   Lab Results  Component Value Date   NA 142 03/27/2023   K 4.4 03/27/2023   CO2 27 03/27/2023   GLUCOSE 116 (H) 03/27/2023   BUN 18 03/27/2023   CREATININE 0.93 03/27/2023   BILITOT 1.2 03/27/2023   ALKPHOS 149 (H) 03/27/2023   AST 19 03/27/2023   ALT 10 03/27/2023   PROT 6.9 03/27/2023   ALBUMIN 4.1 03/27/2023   CALCIUM 10.2 03/27/2023   ANIONGAP 10 07/29/2019   EGFR 62 03/27/2023   Lab Results  Component Value Date   CHOL 165 03/27/2023   Lab Results  Component Value Date   HDL 55 03/27/2023   Lab Results  Component Value Date   LDLCALC 89 03/27/2023   Lab Results  Component Value Date   TRIG 119 03/27/2023   Lab Results  Component Value Date   CHOLHDL 3.0 03/27/2023   Lab Results  Component Value Date   HGBA1C 6.6 (H) 03/27/2023      Assessment & Plan:   Problem List Items Addressed This Visit       Cardiovascular and Mediastinum   Hypertensive heart disease with heart failure (HCC)   Relevant Orders   CBC with Differential/Platelet   Comprehensive metabolic panel Continue current meds Follow up with cardiology as directed     Digestive   Gastro-esophageal reflux disease without esophagitis   Relevant Medications   omeprazole (PRILOSEC) 20 MG capsule Continue meds     Other   Hyperlipidemia associated with diabetes (HCC)   Relevant Orders    Lipid panel Watch diet and continue meds   Vitamin D deficiency   Relevant Orders   VITAMIN D 25 Hydroxy (Vit-D Deficiency, Fractures)   Diabetes without complication (HCC)- diet controlled   Relevant Orders   Hemoglobin A1c Continue to watch diet  Atrial fibrillation Continue current meds Follow up with cardiology as directed  Chronic knee pain Referral to ortho    Breast cancer screening Mammogram scheduled Left breast pain Diagnostic mammogram ordered  Atypical skin lesion Referral to dermatology   Other Visit Diagnoses         Meds ordered this encounter  Medications   apixaban (ELIQUIS) 5 MG TABS tablet    Sig: Take 1 tablet (5 mg total) by mouth 2 (two) times daily.    Dispense:  60 tablet    Refill:  2    Supervising Provider:   Blane Ohara [[id number]]   loratadine (CLARITIN) 10 MG tablet    Sig: Take 1 tablet (10 mg total) by mouth daily as needed for allergies.    Dispense:  90 tablet    Refill:  1    Supervising Provider:   Blane Ohara [id number]     Follow-up: Return in about 4 months (around 11/25/2023) for chronic fasting follow-up.    SARA R Hakim Minniefield, PA-C

## 2023-07-29 LAB — CBC WITH DIFFERENTIAL/PLATELET
Basophils Absolute: 0 10*3/uL (ref 0.0–0.2)
Basos: 1 %
EOS (ABSOLUTE): 0.2 10*3/uL (ref 0.0–0.4)
Eos: 4 %
Hematocrit: 41.5 % (ref 34.0–46.6)
Hemoglobin: 13.1 g/dL (ref 11.1–15.9)
Immature Grans (Abs): 0 10*3/uL (ref 0.0–0.1)
Immature Granulocytes: 0 %
Lymphocytes Absolute: 0.6 10*3/uL — ABNORMAL LOW (ref 0.7–3.1)
Lymphs: 14 %
MCH: 27.6 pg (ref 26.6–33.0)
MCHC: 31.6 g/dL (ref 31.5–35.7)
MCV: 88 fL (ref 79–97)
Monocytes Absolute: 0.5 10*3/uL (ref 0.1–0.9)
Monocytes: 12 %
Neutrophils Absolute: 3.1 10*3/uL (ref 1.4–7.0)
Neutrophils: 69 %
Platelets: 195 10*3/uL (ref 150–450)
RBC: 4.74 x10E6/uL (ref 3.77–5.28)
RDW: 14.1 % (ref 11.7–15.4)
WBC: 4.5 10*3/uL (ref 3.4–10.8)

## 2023-07-29 LAB — HEMOGLOBIN A1C
Est. average glucose Bld gHb Est-mCnc: 137 mg/dL
Hgb A1c MFr Bld: 6.4 % — ABNORMAL HIGH (ref 4.8–5.6)

## 2023-07-29 LAB — COMPREHENSIVE METABOLIC PANEL
ALT: 9 [IU]/L (ref 0–32)
AST: 15 [IU]/L (ref 0–40)
Albumin: 4.2 g/dL (ref 3.8–4.8)
Alkaline Phosphatase: 170 [IU]/L — ABNORMAL HIGH (ref 44–121)
BUN/Creatinine Ratio: 18 (ref 12–28)
BUN: 18 mg/dL (ref 8–27)
Bilirubin Total: 1.1 mg/dL (ref 0.0–1.2)
CO2: 29 mmol/L (ref 20–29)
Calcium: 9.7 mg/dL (ref 8.7–10.3)
Chloride: 97 mmol/L (ref 96–106)
Creatinine, Ser: 1.01 mg/dL — ABNORMAL HIGH (ref 0.57–1.00)
Globulin, Total: 2.5 g/dL (ref 1.5–4.5)
Glucose: 112 mg/dL — ABNORMAL HIGH (ref 70–99)
Potassium: 3.8 mmol/L (ref 3.5–5.2)
Sodium: 141 mmol/L (ref 134–144)
Total Protein: 6.7 g/dL (ref 6.0–8.5)
eGFR: 56 mL/min/{1.73_m2} — ABNORMAL LOW (ref >59)

## 2023-07-29 LAB — TSH: TSH: 2.1 u[IU]/mL (ref 0.450–4.500)

## 2023-07-29 LAB — LIPID PANEL
Chol/HDL Ratio: 3.1 {ratio} (ref 0.0–4.4)
Cholesterol, Total: 163 mg/dL (ref 100–199)
HDL: 52 mg/dL (ref >39)
LDL Chol Calc (NIH): 92 mg/dL (ref 0–99)
Triglycerides: 104 mg/dL (ref 0–149)
VLDL Cholesterol Cal: 19 mg/dL (ref 5–40)

## 2023-07-29 LAB — VITAMIN D 25 HYDROXY (VIT D DEFICIENCY, FRACTURES): Vit D, 25-Hydroxy: 63.8 ng/mL (ref 30.0–100.0)

## 2023-08-05 DIAGNOSIS — H43813 Vitreous degeneration, bilateral: Secondary | ICD-10-CM | POA: Diagnosis not present

## 2023-08-05 DIAGNOSIS — H353124 Nonexudative age-related macular degeneration, left eye, advanced atrophic with subfoveal involvement: Secondary | ICD-10-CM | POA: Diagnosis not present

## 2023-08-05 DIAGNOSIS — H353211 Exudative age-related macular degeneration, right eye, with active choroidal neovascularization: Secondary | ICD-10-CM | POA: Diagnosis not present

## 2023-08-06 DIAGNOSIS — M170 Bilateral primary osteoarthritis of knee: Secondary | ICD-10-CM | POA: Diagnosis not present

## 2023-08-06 DIAGNOSIS — M1711 Unilateral primary osteoarthritis, right knee: Secondary | ICD-10-CM | POA: Insufficient documentation

## 2023-08-06 DIAGNOSIS — M25562 Pain in left knee: Secondary | ICD-10-CM | POA: Insufficient documentation

## 2023-08-06 DIAGNOSIS — M1712 Unilateral primary osteoarthritis, left knee: Secondary | ICD-10-CM | POA: Insufficient documentation

## 2023-08-07 ENCOUNTER — Other Ambulatory Visit: Payer: Self-pay | Admitting: Physician Assistant

## 2023-08-07 DIAGNOSIS — N644 Mastodynia: Secondary | ICD-10-CM

## 2023-08-07 DIAGNOSIS — R0602 Shortness of breath: Secondary | ICD-10-CM

## 2023-08-21 DIAGNOSIS — N958 Other specified menopausal and perimenopausal disorders: Secondary | ICD-10-CM | POA: Diagnosis not present

## 2023-08-21 LAB — HM DEXA SCAN

## 2023-08-23 DIAGNOSIS — N958 Other specified menopausal and perimenopausal disorders: Secondary | ICD-10-CM | POA: Diagnosis not present

## 2023-08-23 DIAGNOSIS — M810 Age-related osteoporosis without current pathological fracture: Secondary | ICD-10-CM | POA: Diagnosis not present

## 2023-08-25 ENCOUNTER — Encounter: Payer: Self-pay | Admitting: Physician Assistant

## 2023-09-23 ENCOUNTER — Other Ambulatory Visit: Payer: Self-pay

## 2023-09-23 DIAGNOSIS — M810 Age-related osteoporosis without current pathological fracture: Secondary | ICD-10-CM

## 2023-09-23 MED ORDER — DENOSUMAB 60 MG/ML ~~LOC~~ SOSY
60.0000 mg | PREFILLED_SYRINGE | Freq: Once | SUBCUTANEOUS | Status: AC
Start: 2023-10-07 — End: 2023-10-28
  Administered 2023-10-28: 60 mg via SUBCUTANEOUS

## 2023-09-25 ENCOUNTER — Telehealth: Payer: Self-pay

## 2023-09-25 NOTE — Telephone Encounter (Signed)
 LM for patient to return call to schedule Prolia injection Nurse visit ([id number]) she can schedule any time just call 30 minutes prior

## 2023-09-26 ENCOUNTER — Ambulatory Visit: Payer: Self-pay

## 2023-09-26 ENCOUNTER — Ambulatory Visit

## 2023-09-26 VITALS — BP 120/80 | HR 107 | Temp 97.5°F | Resp 14 | Ht 65.5 in | Wt 253.0 lb

## 2023-09-26 DIAGNOSIS — R058 Other specified cough: Secondary | ICD-10-CM | POA: Diagnosis not present

## 2023-09-26 DIAGNOSIS — J301 Allergic rhinitis due to pollen: Secondary | ICD-10-CM

## 2023-09-26 DIAGNOSIS — H6121 Impacted cerumen, right ear: Secondary | ICD-10-CM | POA: Diagnosis not present

## 2023-09-26 MED ORDER — PREDNISONE 20 MG PO TABS: 40.0000 mg | ORAL_TABLET | Freq: Every day | ORAL | 0 refills | Status: AC

## 2023-09-26 NOTE — Patient Instructions (Signed)
 VISIT SUMMARY:  During your visit, we discussed your symptoms of throat tightness and nasal congestion, which you have been experiencing for the past week. We also reviewed your atrial fibrillation management and addressed the cerumen buildup in your right ear.  YOUR PLAN:  -ALLERGIC RHINITIS: Allergic rhinitis is an inflammation of the nasal passages usually caused by allergens like pollen. You should continue using loratadine, Mucinex, and Nasacort nasal spray. If your symptoms worsen over the weekend, consider taking prednisone which is sent to your pharmacy.  Additionally, try steam inhalation and using an air purifier or air filter at home.  -ATRIAL FIBRILLATION: Atrial fibrillation is an irregular and often rapid heart rate. You are currently taking Plaquenil for this condition. Please monitor your heart rate and any symptoms of atrial fibrillation.  -CERUMEN IMPACTION: Cerumen impaction is a buildup of earwax that can block the ear canal. You have significant cerumen buildup in your right ear. Use Debrox ear drops, a couple of drops every few hours per day, to soften the earwax. Use a napkin to remove the softened earwax. If the cerumen does not clear, schedule a follow-up for ear cleaning.  INSTRUCTIONS:  If your symptoms of allergic rhinitis worsen over the weekend, consider taking prednisone. Schedule a follow-up appointment if the cerumen in your right ear does not clear after using Debrox ear drops.

## 2023-09-26 NOTE — Assessment & Plan Note (Signed)
 Significant cerumen buildup in the right ear, not easily removed during examination. Left ear cleaned and appears normal. - Prescribe Debrox ear drops for the right ear, to be used a couple of drops every few hours per day to soften the cerumen. - Advise using a napkin to remove softened cerumen. - Schedule follow-up for ear cleaning if cerumen does not clear.

## 2023-09-26 NOTE — Telephone Encounter (Signed)
  Chief Complaint: cough  Symptoms: cough   [] ED /[] Urgent Care (no appt availability in office) / [x] Appointment(In office/virtual)/ []  [facility]/ [] Home Care/ [] Refused Recommended Disposition /[] [facility]/ []  Follow-up with PCP Additional Notes: Pt complaining of dry nagging cough that she's had for week. Pt denies any fever. Pt has A Fib and stated "I also have SOB" so this isn't new. Pt denies SOB while resting, but notices it more while moving around. Pt has appt today at 11. RN gave care advice and pt verbalized understanding.           Copied from CRM [id number]. Topic: Clinical - Medication Question >> Sep 26, 2023  8:31 AM Clayton Bibles wrote: Reason for CRM:  Gustava wants to know what to take for her cough. The more she talks the more she coughs. No other symptoms. She is taking over the counter medication and it is not helping. Can doctor call in cough medication. >> Sep 26, 2023  8:35 AM Clayton Bibles wrote: Please call Tere at [phone] Reason for Disposition  [1] MILD difficulty breathing (e.g., minimal/no SOB at rest, SOB with walking, pulse <100) AND [2] still present when not coughing  Answer Assessment - Initial Assessment Questions 1. ONSET: "When did the cough begin?"      One week ago  2. SEVERITY: "How bad is the cough today?"      Chest tightened up  3. SPUTUM: "Describe the color of your sputum" (none, dry cough; clear, white, yellow, green)     Denies  4. HEMOPTYSIS: "Are you coughing up any blood?" If so ask: "How much?" (flecks, streaks, tablespoons, etc.)     Denies  5. DIFFICULTY BREATHING: "Are you having difficulty breathing?" If Yes, ask: "How bad is it?" (e.g., mild, moderate, severe)    - MILD: No SOB at rest, mild SOB with walking, speaks normally in sentences, can lie down, no retractions, pulse < 100.    - MODERATE: SOB at rest, SOB with minimal exertion and prefers to sit, cannot lie down flat, speaks in phrases, mild  retractions, audible wheezing, pulse 100-120.    - SEVERE: Very SOB at rest, speaks in single words, struggling to breathe, sitting hunched forward, retractions, pulse > 120      Moderate  6. FEVER: "Do you have a fever?" If Yes, ask: "What is your temperature, how was it measured, and when did it start?"     Denis  7. CARDIAC HISTORY: "Do you have any history of heart disease?" (e.g., heart attack, congestive heart failure)      A Fib  8. LUNG HISTORY: "Do you have any history of lung disease?"  (e.g., pulmonary embolus, asthma, emphysema)     Denies  9. PE RISK FACTORS: "Do you have a history of blood clots?" (or: recent major surgery, recent prolonged travel, bedridden)     yes 10. OTHER SYMPTOMS: "Do you have any other symptoms?" (e.g., runny nose, wheezing, chest pain)       Denies  Protocols used: Cough - Acute Non-Productive-A-AH

## 2023-09-26 NOTE — Assessment & Plan Note (Signed)
 Symptoms of throat tightness, coughing, and nasal congestion began last week. Experiences similar symptoms two to three times annually, typically managed with medications. Current treatment with loratadine, Mucinex, and Nasacort nasal spray is ineffective. Examination reveals nasal congestion and postnasal drainage, with no fever or body aches. Suspected allergic rhinitis, possibly exacerbated by pollen allergies. Prednisone considered if symptoms worsen, with informed consent regarding potential side effects including hyperglycemia, weight gain, and hypertension. - Continue loratadine, Mucinex, and Nasacort nasal spray. - Consider prednisone if symptoms worsen over the weekend. Sent prednisone 20 mg tabs, to take 2 tabs daily for 5 days. - Perform steam inhalation. - Use an air purifier or air filter at home. Report back if any worsening symptoms

## 2023-09-26 NOTE — Progress Notes (Signed)
 Acute Office Visit  Subjective:    Patient ID: Miranda Moses, female    DOB: 1941/04/24, 83 y.o.   MRN: [id number]  Chief Complaint  Patient presents with   Cough    Discussed the use of AI scribe software for clinical note transcription with the patient, who gave verbal consent to proceed.       HPI: Miranda Moses is an 83 year old female who presents with throat tightness and nasal congestion.  She has been experiencing throat tightness and nasal congestion for the past week, which are part of a recurring pattern occurring two to three times a year. These episodes are usually managed with prescription medications like prednisone. This is the first episode since last winter. She describes her nose as 'stopped up' with drainage in the back of her throat. No fever, body aches, itchy eyes, or ear discomfort are present. She does not suspect an infection like COVID-19 or the flu, as no one around her has been sick.  She has been using loratadine, Mucinex, and Nasacort nasal spray without significant relief. She mentions difficulty taking Mucinex and a fluid pill simultaneously. Her medications are sent to [facility] in [place] for refills.  She reports not having much sleep recently, which may be contributing to her overall discomfort.  She is currently on eliquis for atrial fibrillation.  Past Medical History:  Diagnosis Date   Angina pectoris (HCC) 02/18/2018   CAD in native artery    by CT coronary 10/2018 >> medical therapy    CHF (congestive heart failure), NYHA class I, chronic, diastolic (HCC) 08/09/2019   Chronic diastolic (congestive) heart failure (HCC)    Coronary artery disease of native artery of native heart with stable angina pectoris (HCC) 08/09/2019   COVID-19 virus infection    Encounter for vaccination 10/03/2020   Family history of coronary artery disease 01/30/2018   Gastro-esophageal reflux disease without esophagitis 01/30/2018   GERD (gastroesophageal reflux disease)     Health maintenance examination 10/03/2020   History of pulmonary embolism 08/23/2019   Hyperlipidemia    Hypertension    Hypertensive heart disease with heart failure (HCC) 01/30/2018   Mixed hyperlipidemia 02/18/2018   New onset atrial fibrillation (HCC)    Other fatigue 02/10/2020   Other specified menopausal and perimenopausal disorders 02/10/2020   PAD (peripheral artery disease) (HCC) 08/28/2018   PAH (pulmonary artery hypertension) (HCC) 08/28/2018   Pre-diabetes    Prediabetes 02/10/2020   Pulmonary embolus (HCC)    Pulmonary hypertension (HCC)    Vitamin D deficiency 08/09/2019    Past Surgical History:  Procedure Laterality Date   ABDOMINAL HYSTERECTOMY     CATARACT EXTRACTION Left 03/2014   CATARACT EXTRACTION Right 05/2014   COLONOSCOPY  11/05/2013    Family History  Problem Relation Age of Onset   Heart disease Father     Social History   Socioeconomic History   Marital status: Widowed    Spouse name: Not on file   Number of children: 1   Years of education: Not on file   Highest education level: Not on file  Occupational History   Occupation: retired  Tobacco Use   Smoking status: Never   Smokeless tobacco: Never  Vaping Use   Vaping status: Never Used  Substance and Sexual Activity   Alcohol use: Never   Drug use: Never   Sexual activity: Not on file  Other Topics Concern   Not on file  Social History Narrative   Not on  file   Social Drivers of Health   Financial Resource Strain: Low Risk  (01/09/2023)   Overall Financial Resource Strain (CARDIA)    Difficulty of Paying Living Expenses: Not hard at all  Recent Concern: Financial Resource Strain - High Risk (10/24/2022)   Overall Financial Resource Strain (CARDIA)    Difficulty of Paying Living Expenses: Hard  Food Insecurity: No Food Insecurity (01/09/2023)   Hunger Vital Sign    Worried About Running Out of Food in the Last Year: Never true    Ran Out of Food in the Last Year: Never true   Transportation Needs: No Transportation Needs (01/09/2023)   PRAPARE - Administrator, Civil Service (Medical): No    Lack of Transportation (Non-Medical): No  Physical Activity: Inactive (01/09/2023)   Exercise Vital Sign    Days of Exercise per Week: 0 days    Minutes of Exercise per Session: 0 min  Stress: No Stress Concern Present (01/09/2023)   [employer] of Occupational Health - Occupational Stress Questionnaire    Feeling of Stress : Not at all  Social Connections: Moderately Integrated (01/09/2023)   Social Connection and Isolation Panel [NHANES]    Frequency of Communication with Friends and Family: More than three times a week    Frequency of Social Gatherings with Friends and Family: More than three times a week    Attends Religious Services: More than 4 times per year    Active Member of [employer] or Organizations: Yes    Attends Banker Meetings: More than 4 times per year    Marital Status: Widowed  Intimate Partner Violence: Not At Risk (01/09/2023)   Humiliation, Afraid, Rape, and Kick questionnaire    Fear of Current or Ex-Partner: No    Emotionally Abused: No    Physically Abused: No    Sexually Abused: No    Outpatient Medications Prior to Visit  Medication Sig Dispense Refill   Aflibercept (EYLEA) 2 MG/0.05ML SOLN 2 mg by Intravitreal route as needed. Every nine weeks     apixaban (ELIQUIS) 5 MG TABS tablet Take 1 tablet (5 mg total) by mouth 2 (two) times daily. 60 tablet 2   Ascorbic Acid (VITAMIN C) 100 MG tablet Take 100 mg by mouth daily.     atorvastatin (LIPITOR) 20 MG tablet TAKE 1 TABLET BY MOUTH EVERY DAY 90 tablet 1   calcium carbonate (OSCAL) 1500 (600 Ca) MG TABS tablet Take 600 mg of elemental calcium by mouth daily.     Cholecalciferol (VITAMIN D3 PO) Take 2 capsules by mouth daily.     isosorbide mononitrate (IMDUR) 30 MG 24 hr tablet TAKE 1 TABLET BY MOUTH EVERY DAY 90 tablet 3   loratadine (CLARITIN) 10 MG tablet Take 1  tablet (10 mg total) by mouth daily as needed for allergies. 90 tablet 1   metoprolol tartrate (LOPRESSOR) 25 MG tablet TAKE 1 TABLET(25 MG) BY MOUTH TWICE DAILY 180 tablet 3   Multiple Vitamin (MULTIVITAMIN WITH MINERALS) TABS tablet Take 1 tablet by mouth daily.     Multiple Vitamins-Minerals (OCUVITE PRESERVISION PO) Take 1 tablet by mouth in the morning and at bedtime.     omeprazole (PRILOSEC) 20 MG capsule TAKE 1 CAPSULE BY MOUTH EVERY DAY 90 capsule 1   potassium chloride SA (KLOR-CON M20) 20 MEQ tablet TAKE 1 TABLET BY MOUTH EVERY DAY 90 tablet 0   tobramycin (TOBREX) 0.3 % ophthalmic solution PLEASE SEE ATTACHED FOR DETAILED DIRECTIONS  torsemide (DEMADEX) 20 MG tablet TAKE 1 TABLET BY MOUTH EVERY DAY 90 tablet 0   triamcinolone (NASACORT) 55 MCG/ACT AERO nasal inhaler Place 1 spray into the nose daily as needed for allergies (allergies).     vitamin E 200 UNIT capsule Take 200 Units by mouth daily.     nitroGLYCERIN (NITROSTAT) 0.4 MG SL tablet Place 1 tablet (0.4 mg total) under the tongue every 5 (five) minutes as needed for chest pain. 90 tablet 3   Facility-Administered Medications Prior to Visit  Medication Dose Route Frequency Provider Last Rate Last Admin   [START ON 10/07/2023] denosumab (PROLIA) injection 60 mg  60 mg Subcutaneous Once Marianne Sofia, PA-C        Allergies  Allergen Reactions   Clarithromycin     Numbness of extremities   Codeine Other (See Comments)    Caused pain   Penicillins Rash and Other (See Comments)    Did it involve swelling of the face/tongue/throat, SOB, or low BP? No  Did it involve sudden or severe rash/hives, skin peeling, or any reaction on the inside of your mouth or nose? Yes  Did you need to seek medical attention at a hospital or doctor's office? Yes  When did it last happen?      20 years  If all above answers are "NO", may proceed with cephalosporin use.    Review of Systems  Constitutional:  Negative for chills, fatigue and  fever.  HENT:  Positive for congestion. Negative for ear pain and sore throat.   Respiratory:  Positive for cough and shortness of breath.   Cardiovascular:  Negative for chest pain and palpitations.  Gastrointestinal:  Negative for abdominal pain, constipation, diarrhea, nausea and vomiting.  Endocrine: Negative for polydipsia, polyphagia and polyuria.  Genitourinary:  Negative for difficulty urinating and dysuria.  Musculoskeletal:  Negative for arthralgias, back pain and myalgias.  Skin:  Negative for rash.  Neurological:  Negative for headaches.  Psychiatric/Behavioral:  Negative for dysphoric mood. The patient is not nervous/anxious.        Objective:        09/26/2023   10:55 AM 07/28/2023   11:11 AM 07/18/2023   11:25 AM  Vitals with BMI  Height 5' 5.5" 5' 5.5" 5' 5.5"  Weight 253 lbs 253 lbs 256 lbs 3 oz  BMI 41.45 41.45 41.97  Systolic 120 128 161  Diastolic 80 70 66  Pulse 107 93 98    No data found.   Physical Exam Vitals and nursing note reviewed.  Constitutional:      Appearance: She is obese.     Comments: HEENT: Right ear with cerumen impaction. Left ear normal. Eyes normal. Nasal congestion present. Postnasal drainage present. CHEST: Lungs clear to auscultation bilaterally.  Cardiovascular:     Rate and Rhythm: Normal rate. Rhythm irregular.  Pulmonary:     Effort: Pulmonary effort is normal.     Breath sounds: Normal breath sounds.  Neurological:     General: No focal deficit present.     Mental Status: She is alert.  Psychiatric:        Mood and Affect: Mood normal.     Health Maintenance Due  Topic Date Due   OPHTHALMOLOGY EXAM  Never done   Diabetic kidney evaluation - Urine ACR  Never done   MAMMOGRAM  03/16/2023    There are no preventive care reminders to display for this patient.   Lab Results  Component Value Date   TSH 2.100  07/28/2023   Lab Results  Component Value Date   WBC 4.5 07/28/2023   HGB 13.1 07/28/2023   HCT 41.5  07/28/2023   MCV 88 07/28/2023   PLT 195 07/28/2023   Lab Results  Component Value Date   NA 141 07/28/2023   K 3.8 07/28/2023   CO2 29 07/28/2023   GLUCOSE 112 (H) 07/28/2023   BUN 18 07/28/2023   CREATININE 1.01 (H) 07/28/2023   BILITOT 1.1 07/28/2023   ALKPHOS 170 (H) 07/28/2023   AST 15 07/28/2023   ALT 9 07/28/2023   PROT 6.7 07/28/2023   ALBUMIN 4.2 07/28/2023   CALCIUM 9.7 07/28/2023   ANIONGAP 10 07/29/2019   EGFR 56 (L) 07/28/2023   Lab Results  Component Value Date   CHOL 163 07/28/2023   Lab Results  Component Value Date   HDL 52 07/28/2023   Lab Results  Component Value Date   LDLCALC 92 07/28/2023   Lab Results  Component Value Date   TRIG 104 07/28/2023   Lab Results  Component Value Date   CHOLHDL 3.1 07/28/2023   Lab Results  Component Value Date   HGBA1C 6.4 (H) 07/28/2023       Assessment & Plan:  Seasonal allergic rhinitis due to pollen  Recurrent dry cough Assessment & Plan: Symptoms of throat tightness, coughing, and nasal congestion began last week. Experiences similar symptoms two to three times annually, typically managed with medications. Current treatment with loratadine, Mucinex, and Nasacort nasal spray is ineffective. Examination reveals nasal congestion and postnasal drainage, with no fever or body aches. Suspected allergic rhinitis, possibly exacerbated by pollen allergies. Prednisone considered if symptoms worsen, with informed consent regarding potential side effects including hyperglycemia, weight gain, and hypertension. - Continue loratadine, Mucinex, and Nasacort nasal spray. - Consider prednisone if symptoms worsen over the weekend. Sent prednisone 20 mg tabs, to take 2 tabs daily for 5 days. - Perform steam inhalation. - Use an air purifier or air filter at home. Report back if any worsening symptoms    Impacted cerumen of right ear Assessment & Plan: Significant cerumen buildup in the right ear, not easily removed  during examination. Left ear cleaned and appears normal. - Prescribe Debrox ear drops for the right ear, to be used a couple of drops every few hours per day to soften the cerumen. - Advise using a napkin to remove softened cerumen. - Schedule follow-up for ear cleaning if cerumen does not clear.   Other orders -     predniSONE; Take 2 tablets (40 mg total) by mouth daily with breakfast for 5 days.  Dispense: 10 tablet; Refill: 0     Assessment and Plan       Meds ordered this encounter  Medications   predniSONE (DELTASONE) 20 MG tablet    Sig: Take 2 tablets (40 mg total) by mouth daily with breakfast for 5 days.    Dispense:  10 tablet    Refill:  0    No orders of the defined types were placed in this encounter.    Follow-up: Return if symptoms worsen or fail to improve.  An After Visit Summary was printed and given to the patient.  Windell Moment, MD [facility] [phone]

## 2023-10-08 DIAGNOSIS — N644 Mastodynia: Secondary | ICD-10-CM | POA: Diagnosis not present

## 2023-10-08 LAB — HM MAMMOGRAPHY

## 2023-10-10 ENCOUNTER — Encounter: Payer: Self-pay | Admitting: Physician Assistant

## 2023-10-16 ENCOUNTER — Other Ambulatory Visit: Payer: Self-pay

## 2023-10-16 ENCOUNTER — Telehealth: Payer: Self-pay

## 2023-10-16 DIAGNOSIS — Z5986 Financial insecurity: Secondary | ICD-10-CM

## 2023-10-16 NOTE — Progress Notes (Signed)
   10/16/2023  Patient ID: Miranda Moses, female   DOB: 1941-01-21, 83 y.o.   MRN: [id number]  Spoke with [facility] in response to message received from Davee Erm regarding Eliquis  PAP denial. Appears there was a processing error with reported income - they are now reprocessing and expect to have update in 2-3 business days.   Rolando Cliche, PharmD, BCGP Clinical Pharmacist  [phone]

## 2023-10-21 DIAGNOSIS — H353124 Nonexudative age-related macular degeneration, left eye, advanced atrophic with subfoveal involvement: Secondary | ICD-10-CM | POA: Diagnosis not present

## 2023-10-21 DIAGNOSIS — H43813 Vitreous degeneration, bilateral: Secondary | ICD-10-CM | POA: Diagnosis not present

## 2023-10-21 DIAGNOSIS — H353211 Exudative age-related macular degeneration, right eye, with active choroidal neovascularization: Secondary | ICD-10-CM | POA: Diagnosis not present

## 2023-10-27 ENCOUNTER — Other Ambulatory Visit: Payer: Self-pay | Admitting: Physician Assistant

## 2023-10-27 DIAGNOSIS — R0602 Shortness of breath: Secondary | ICD-10-CM

## 2023-10-27 MED ORDER — APIXABAN 5 MG PO TABS
5.0000 mg | ORAL_TABLET | Freq: Two times a day (BID) | ORAL | 2 refills | Status: DC
Start: 2023-10-27 — End: 2023-10-28

## 2023-10-27 MED ORDER — TORSEMIDE 20 MG PO TABS: 20.0000 mg | ORAL_TABLET | Freq: Every day | ORAL | 0 refills | Status: DC

## 2023-10-27 NOTE — Telephone Encounter (Signed)
 Last Fill: Eliquis : 07/28/23    Torsemide : 08/07/23  Last OV: 09/26/23 Next OV: 11/26/23  Routing to provider for review/authorization.

## 2023-10-27 NOTE — Telephone Encounter (Signed)
 Copied from CRM [id number]. Topic: Clinical - Medication Refill >> Oct 27, 2023  2:14 PM Everlene Hobby D wrote: Most Recent Primary Care Visit:  Provider: SIRIVOL, MAMATHA  Department: [facility]  Visit Type: ACUTE  Date: 09/26/2023  Medication: apixaban  (ELIQUIS ) 5 MG TABS tablet torsemide  (DEMADEX ) 20 MG tablet   Has the patient contacted their pharmacy? No (Agent: If no, request that the patient contact the pharmacy for the refill. If patient does not wish to contact the pharmacy document the reason why and proceed with request.) (Agent: If yes, when and what did the pharmacy advise?)  Is this the correct pharmacy for this prescription? Yes If no, delete pharmacy and type the correct one.  This is the patient's preferred pharmacy:  [facility] on [address]   Has the prescription been filled recently? No  Is the patient out of the medication? Yes  Has the patient been seen for an appointment in the last year OR does the patient have an upcoming appointment? Yes  Can we respond through MyChart? No  Agent: Please be advised that Rx refills may take up to 3 business days. We ask that you follow-up with your pharmacy.

## 2023-10-28 ENCOUNTER — Ambulatory Visit (INDEPENDENT_AMBULATORY_CARE_PROVIDER_SITE_OTHER)

## 2023-10-28 ENCOUNTER — Other Ambulatory Visit: Payer: Self-pay

## 2023-10-28 DIAGNOSIS — R0602 Shortness of breath: Secondary | ICD-10-CM

## 2023-10-28 DIAGNOSIS — M810 Age-related osteoporosis without current pathological fracture: Secondary | ICD-10-CM

## 2023-10-28 MED ORDER — TORSEMIDE 20 MG PO TABS
20.0000 mg | ORAL_TABLET | Freq: Every day | ORAL | 0 refills | Status: AC
Start: 2023-10-28 — End: ?

## 2023-10-28 MED ORDER — DENOSUMAB 60 MG/ML ~~LOC~~ SOSY
60.0000 mg | PREFILLED_SYRINGE | SUBCUTANEOUS | Status: DC
Start: 2024-04-25 — End: 2023-10-28

## 2023-10-28 MED ORDER — APIXABAN 5 MG PO TABS: 5.0000 mg | ORAL_TABLET | Freq: Two times a day (BID) | ORAL | 2 refills | Status: DC

## 2023-10-28 NOTE — Progress Notes (Signed)
   Patient: Miranda Moses  DOB: 1940-11-29  MRN: [id number]    Visit Date: 10/28/2023    Miranda Moses presents today for her initial Prolia  injection.  1 x 60 mg Single-Dose Prefilled Syringe was given SQ in the left arm.  Patient tolerated the injection well and has no questions.  The next Prolia  injection will be due in six months.      Caryl Clas, CMA

## 2023-10-30 ENCOUNTER — Other Ambulatory Visit: Payer: Self-pay

## 2023-10-30 NOTE — Progress Notes (Signed)
   10/30/2023  Patient ID: Miranda Moses, female   DOB: Apr 12, 1941, 83 y.o.   MRN: [id number]  Spoke with patient regarding medication assistance on eliquis  - had not heard anything back from [employer].  Called [facility] pap once more, stated there was another issue in processing, this time they are needing copy of tax return.   Patient aware to bring this form to office, and she will have them send to me.   Once sending back to [facility] PAP we were advised to include patient ID on each page. [id number]  Rolando Cliche, PharmD, BCGP Clinical Pharmacist  [phone]

## 2023-11-26 ENCOUNTER — Ambulatory Visit: Payer: Medicare Other | Admitting: Physician Assistant

## 2023-12-02 ENCOUNTER — Ambulatory Visit (INDEPENDENT_AMBULATORY_CARE_PROVIDER_SITE_OTHER): Admitting: Physician Assistant

## 2023-12-02 ENCOUNTER — Encounter: Payer: Self-pay | Admitting: Physician Assistant

## 2023-12-02 VITALS — BP 118/78 | HR 87 | Temp 98.0°F | Resp 18 | Ht 65.5 in | Wt 248.8 lb

## 2023-12-02 DIAGNOSIS — E782 Mixed hyperlipidemia: Secondary | ICD-10-CM | POA: Diagnosis not present

## 2023-12-02 DIAGNOSIS — K219 Gastro-esophageal reflux disease without esophagitis: Secondary | ICD-10-CM

## 2023-12-02 DIAGNOSIS — I5032 Chronic diastolic (congestive) heart failure: Secondary | ICD-10-CM | POA: Diagnosis not present

## 2023-12-02 DIAGNOSIS — J3089 Other allergic rhinitis: Secondary | ICD-10-CM | POA: Diagnosis not present

## 2023-12-02 DIAGNOSIS — I739 Peripheral vascular disease, unspecified: Secondary | ICD-10-CM

## 2023-12-02 DIAGNOSIS — M810 Age-related osteoporosis without current pathological fracture: Secondary | ICD-10-CM

## 2023-12-02 DIAGNOSIS — E1169 Type 2 diabetes mellitus with other specified complication: Secondary | ICD-10-CM | POA: Diagnosis not present

## 2023-12-02 DIAGNOSIS — I4811 Longstanding persistent atrial fibrillation: Secondary | ICD-10-CM

## 2023-12-02 DIAGNOSIS — E559 Vitamin D deficiency, unspecified: Secondary | ICD-10-CM | POA: Diagnosis not present

## 2023-12-02 DIAGNOSIS — E119 Type 2 diabetes mellitus without complications: Secondary | ICD-10-CM

## 2023-12-02 MED ORDER — LORATADINE 10 MG PO TABS: 10.0000 mg | ORAL_TABLET | Freq: Every day | ORAL | 1 refills | Status: DC | PRN

## 2023-12-02 NOTE — Progress Notes (Signed)
 Established Patient Office Visit  Subjective:  Patient ID: Miranda Moses, female    DOB: 1940-11-20  Age: 83 y.o. MRN: [id number]  CC:  Chief Complaint  Patient presents with   Medical Management of Chronic Issues    HPI Miranda Moses presents for chronic follow up hyperlipidemia  Mixed hyperlipidemia  Pt presents with hyperlipidemia.  Compliance with treatment has been good; The patient is compliant with medications, maintains a low cholesterol diet , follows up as directed ,  . The patient denies experiencing any hypercholesterolemia related symptoms. . Pt currently on atorvastatin  20mg  qd  Pt presents for follow up of hypertension.  The patient is tolerating the medication well without side effects. Compliance with treatment has been good; including taking medication as directed , maintains a healthy diet and regular exercise regimen , and following up as directed.  Pt currently on metoprolol  25mg  bid and also takes toresemide 20mg   Pt has a history of chronic diastolic CHF and CAD- she is trying to do a low sodium diet and currently on torsemide  20mg  and potassium supplements -  She is also on imdur  30mg  qd Pt with history of afib - is currently following with cardiology- she is currently on Eliquis  5mg  bid (also has history of PE in 07/2019) she follows with Dr Miranda Moses and has a follow up appt in August Denies chest pain/sob/edema  Pt with history of GERD - doing well on omeprazole  20mg   Pt with history of diabetes - she has never been on medication and recent hgb a1cs have been under 7.0 - she denies any problems Is due for labwork  Pt currently on daily vit D3 supplement - due for labwork Past Medical History:  Diagnosis Date   Angina pectoris (HCC) 02/18/2018   CAD in native artery    by CT coronary 10/2018 >> medical therapy    CHF (congestive heart failure), NYHA class I, chronic, diastolic (HCC) 08/09/2019   Chronic diastolic (congestive) heart failure (HCC)    Coronary artery  disease of native artery of native heart with stable angina pectoris (HCC) 08/09/2019   COVID-19 virus infection    Encounter for vaccination 10/03/2020   Family history of coronary artery disease 01/30/2018   Gastro-esophageal reflux disease without esophagitis 01/30/2018   GERD (gastroesophageal reflux disease)    Health maintenance examination 10/03/2020   History of pulmonary embolism 08/23/2019   Hyperlipidemia    Hypertension    Hypertensive heart disease with heart failure (HCC) 01/30/2018   Mixed hyperlipidemia 02/18/2018   New onset atrial fibrillation (HCC)    Other fatigue 02/10/2020   Other specified menopausal and perimenopausal disorders 02/10/2020   PAD (peripheral artery disease) (HCC) 08/28/2018   PAH (pulmonary artery hypertension) (HCC) 08/28/2018   Pre-diabetes    Prediabetes 02/10/2020   Pulmonary embolus (HCC)    Pulmonary hypertension (HCC)    Vitamin D  deficiency 08/09/2019    Past Surgical History:  Procedure Laterality Date   ABDOMINAL HYSTERECTOMY     CATARACT EXTRACTION Left 03/2014   CATARACT EXTRACTION Right 05/2014   COLONOSCOPY  11/05/2013    Family History  Problem Relation Age of Onset   Heart disease Father     Social History   Socioeconomic History   Marital status: Widowed    Spouse name: Not on file   Number of children: 1   Years of education: Not on file   Highest education level: Not on file  Occupational History   Occupation: retired  Tobacco Use  Smoking status: Never   Smokeless tobacco: Never  Vaping Use   Vaping status: Never Used  Substance and Sexual Activity   Alcohol use: Never   Drug use: Never   Sexual activity: Not on file  Other Topics Concern   Not on file  Social History Narrative   Not on file   Social Drivers of Health   Financial Resource Strain: Low Risk  (01/09/2023)   Overall Financial Resource Strain (CARDIA)    Difficulty of Paying Living Expenses: Not hard at all  Recent Concern: Financial Resource Strain -  High Risk (10/24/2022)   Overall Financial Resource Strain (CARDIA)    Difficulty of Paying Living Expenses: Hard  Food Insecurity: No Food Insecurity (01/09/2023)   Hunger Vital Sign    Worried About Running Out of Food in the Last Year: Never true    Ran Out of Food in the Last Year: Never true  Transportation Needs: No Transportation Needs (01/09/2023)   PRAPARE - Administrator, Civil Service (Medical): No    Lack of Transportation (Non-Medical): No  Physical Activity: Inactive (01/09/2023)   Exercise Vital Sign    Days of Exercise per Week: 0 days    Minutes of Exercise per Session: 0 min  Stress: No Stress Concern Present (01/09/2023)   [employer] of Occupational Health - Occupational Stress Questionnaire    Feeling of Stress : Not at all  Social Connections: Moderately Integrated (01/09/2023)   Social Connection and Isolation Panel [NHANES]    Frequency of Communication with Friends and Family: More than three times a week    Frequency of Social Gatherings with Friends and Family: More than three times a week    Attends Religious Services: More than 4 times per year    Active Member of [employer] or Organizations: Yes    Attends Banker Meetings: More than 4 times per year    Marital Status: Widowed  Intimate Partner Violence: Not At Risk (01/09/2023)   Humiliation, Afraid, Rape, and Kick questionnaire    Fear of Current or Ex-Partner: No    Emotionally Abused: No    Physically Abused: No    Sexually Abused: No     Current Outpatient Medications:    Aflibercept  (EYLEA ) 2 MG/0.05ML SOLN, 2 mg by Intravitreal route as needed. Every nine weeks, Disp: , Rfl:    apixaban  (ELIQUIS ) 5 MG TABS tablet, Take 1 tablet (5 mg total) by mouth 2 (two) times daily., Disp: 60 tablet, Rfl: 2   Ascorbic Acid (VITAMIN C) 100 MG tablet, Take 100 mg by mouth daily., Disp: , Rfl:    atorvastatin  (LIPITOR) 20 MG tablet, TAKE 1 TABLET BY MOUTH EVERY DAY, Disp: 90 tablet, Rfl:  1   calcium  carbonate (OSCAL) 1500 (600 Ca) MG TABS tablet, Take 600 mg of elemental calcium  by mouth daily., Disp: , Rfl:    Cholecalciferol (VITAMIN D3 PO), Take 2 capsules by mouth daily., Disp: , Rfl:    isosorbide  mononitrate (IMDUR ) 30 MG 24 hr tablet, TAKE 1 TABLET BY MOUTH EVERY DAY, Disp: 90 tablet, Rfl: 3   metoprolol  tartrate (LOPRESSOR ) 25 MG tablet, TAKE 1 TABLET(25 MG) BY MOUTH TWICE DAILY, Disp: 180 tablet, Rfl: 3   Multiple Vitamin (MULTIVITAMIN WITH MINERALS) TABS tablet, Take 1 tablet by mouth daily., Disp: , Rfl:    Multiple Vitamins-Minerals (OCUVITE PRESERVISION PO), Take 1 tablet by mouth in the morning and at bedtime., Disp: , Rfl:    omeprazole  (PRILOSEC) 20 MG capsule,  TAKE 1 CAPSULE BY MOUTH EVERY DAY, Disp: 90 capsule, Rfl: 1   potassium chloride  SA (KLOR-CON  M20) 20 MEQ tablet, TAKE 1 TABLET BY MOUTH EVERY DAY, Disp: 90 tablet, Rfl: 0   tobramycin (TOBREX) 0.3 % ophthalmic solution, PLEASE SEE ATTACHED FOR DETAILED DIRECTIONS, Disp: , Rfl:    torsemide  (DEMADEX ) 20 MG tablet, Take 1 tablet (20 mg total) by mouth daily., Disp: 90 tablet, Rfl: 0   triamcinolone  (NASACORT ) 55 MCG/ACT AERO nasal inhaler, Place 1 spray into the nose daily as needed for allergies (allergies)., Disp: , Rfl:    vitamin E 200 UNIT capsule, Take 200 Units by mouth daily., Disp: , Rfl:    loratadine  (CLARITIN ) 10 MG tablet, Take 1 tablet (10 mg total) by mouth daily as needed for allergies., Disp: 90 tablet, Rfl: 1   nitroGLYCERIN  (NITROSTAT ) 0.4 MG SL tablet, Place 1 tablet (0.4 mg total) under the tongue every 5 (five) minutes as needed for chest pain., Disp: 90 tablet, Rfl: 3   Allergies  Allergen Reactions   Clarithromycin     Numbness of extremities   Codeine Other (See Comments)    Caused pain   Penicillins Rash and Other (See Comments)    Did it involve swelling of the face/tongue/throat, SOB, or low BP? No  Did it involve sudden or severe rash/hives, skin peeling, or any reaction on  the inside of your mouth or nose? Yes  Did you need to seek medical attention at a hospital or doctor's office? Yes  When did it last happen?      20 years  If all above answers are "NO", may proceed with cephalosporin use.   CONSTITUTIONAL: Negative for chills, fatigue, fever, unintentional weight gain and unintentional weight loss.  E/N/T: Negative for ear pain, nasal congestion and sore throat.  CARDIOVASCULAR: Negative for chest pain, dizziness, palpitations and pedal edema.  RESPIRATORY: Negative for recent cough and dyspnea.  GASTROINTESTINAL: Negative for abdominal pain, acid reflux symptoms, constipation, diarrhea, nausea and vomiting.  MSK: Negative for arthralgias and myalgias.  INTEGUMENTARY: Negative for rash.  NEUROLOGICAL: Negative for dizziness and headaches.  PSYCHIATRIC: Negative for sleep disturbance and to question depression screen.  Negative for depression, negative for anhedonia.         Objective:  PHYSICAL EXAM:   VS: BP 118/78   Pulse 87   Temp 98 F (36.7 C) (Temporal)   Resp 18   Ht 5' 5.5" (1.664 m)   Wt 248 lb 12.8 oz (112.9 kg)   LMP  (LMP Unknown)   SpO2 99%   BMI 40.77 kg/m   Gen- no acute distress Cardiac: RRR; no murmurs, rubs, or gallops,no edema - no significant varicosities Respiratory:  normal respiratory rate and pattern with no distress - normal breath sounds with no rales, rhonchi, wheezes or rubs  MS: no deformity or atrophy  Skin: warm and dry, no rash  Neuro:  Alert and Oriented x 3, - CN II-Xii grossly intact Psych: euthymic mood, appropriate affect and demeanor   Health Maintenance Due  Topic Date Due   Medicare Annual Wellness (AWV)  01/09/2024    There are no preventive care reminders to display for this patient.  Lab Results  Component Value Date   TSH 2.100 07/28/2023   Lab Results  Component Value Date   WBC 4.5 07/28/2023   HGB 13.1 07/28/2023   HCT 41.5 07/28/2023   MCV 88 07/28/2023   PLT 195  07/28/2023   Lab Results  Component Value  Date   NA 141 07/28/2023   K 3.8 07/28/2023   CO2 29 07/28/2023   GLUCOSE 112 (H) 07/28/2023   BUN 18 07/28/2023   CREATININE 1.01 (H) 07/28/2023   BILITOT 1.1 07/28/2023   ALKPHOS 170 (H) 07/28/2023   AST 15 07/28/2023   ALT 9 07/28/2023   PROT 6.7 07/28/2023   ALBUMIN 4.2 07/28/2023   CALCIUM  9.7 07/28/2023   ANIONGAP 10 07/29/2019   EGFR 56 (L) 07/28/2023   Lab Results  Component Value Date   CHOL 163 07/28/2023   Lab Results  Component Value Date   HDL 52 07/28/2023   Lab Results  Component Value Date   LDLCALC 92 07/28/2023   Lab Results  Component Value Date   TRIG 104 07/28/2023   Lab Results  Component Value Date   CHOLHDL 3.1 07/28/2023   Lab Results  Component Value Date   HGBA1C 6.4 (H) 07/28/2023      Assessment & Plan:   Problem List Items Addressed This Visit       Cardiovascular and Mediastinum   Hypertensive heart disease with heart failure (HCC)   Relevant Orders   CBC with Differential/Platelet   Comprehensive metabolic panel Continue current meds Follow up with cardiology as directed     Digestive   Gastro-esophageal reflux disease without esophagitis   Relevant Medications   omeprazole  (PRILOSEC) 20 MG capsule Continue meds     Other   Hyperlipidemia associated with diabetes (HCC)   Relevant Orders   Lipid panel Watch diet and continue meds   Vitamin D  deficiency   Relevant Orders   VITAMIN D  25 Hydroxy (Vit-D Deficiency, Fractures)   Diabetes without complication (HCC)- diet controlled   Relevant Orders   Hemoglobin A1c Continue to watch diet  Atrial fibrillation Continue current meds Follow up with cardiology as directed  Morbid obesity (HCC) Efforts at diet and exercise   Other Visit Diagnoses         Meds ordered this encounter  Medications   loratadine  (CLARITIN ) 10 MG tablet    Sig: Take 1 tablet (10 mg total) by mouth daily as needed for allergies.     Dispense:  90 tablet    Refill:  1    Supervising Provider:   Mercy Stall [id number]     Follow-up: Return in about 4 months (around 04/02/2024) for chronic fasting follow-up.    SARA R Jaedah Lords, PA-C

## 2023-12-03 ENCOUNTER — Ambulatory Visit: Payer: Self-pay | Admitting: Physician Assistant

## 2023-12-03 ENCOUNTER — Telehealth: Payer: Self-pay

## 2023-12-03 LAB — CBC WITH DIFFERENTIAL/PLATELET
Basophils Absolute: 0.1 10*3/uL (ref 0.0–0.2)
Basos: 1 %
EOS (ABSOLUTE): 0.2 10*3/uL (ref 0.0–0.4)
Eos: 5 %
Hematocrit: 40.9 % (ref 34.0–46.6)
Hemoglobin: 13.3 g/dL (ref 11.1–15.9)
Immature Grans (Abs): 0 10*3/uL (ref 0.0–0.1)
Immature Granulocytes: 0 %
Lymphocytes Absolute: 0.7 10*3/uL (ref 0.7–3.1)
Lymphs: 18 %
MCH: 28.9 pg (ref 26.6–33.0)
MCHC: 32.5 g/dL (ref 31.5–35.7)
MCV: 89 fL (ref 79–97)
Monocytes Absolute: 0.5 10*3/uL (ref 0.1–0.9)
Monocytes: 12 %
Neutrophils Absolute: 2.5 10*3/uL (ref 1.4–7.0)
Neutrophils: 64 %
Platelets: 202 10*3/uL (ref 150–450)
RBC: 4.61 x10E6/uL (ref 3.77–5.28)
RDW: 14.1 % (ref 11.7–15.4)
WBC: 4 10*3/uL (ref 3.4–10.8)

## 2023-12-03 LAB — LIPID PANEL
Chol/HDL Ratio: 3.3 ratio (ref 0.0–4.4)
Cholesterol, Total: 153 mg/dL (ref 100–199)
HDL: 47 mg/dL (ref >39)
LDL Chol Calc (NIH): 86 mg/dL (ref 0–99)
Triglycerides: 113 mg/dL (ref 0–149)
VLDL Cholesterol Cal: 20 mg/dL (ref 5–40)

## 2023-12-03 LAB — COMPREHENSIVE METABOLIC PANEL WITH GFR
ALT: 6 IU/L (ref 0–32)
AST: 15 IU/L (ref 0–40)
Albumin: 4.1 g/dL (ref 3.7–4.7)
Alkaline Phosphatase: 144 IU/L — ABNORMAL HIGH (ref 44–121)
BUN/Creatinine Ratio: 18 (ref 12–28)
BUN: 17 mg/dL (ref 8–27)
Bilirubin Total: 1 mg/dL (ref 0.0–1.2)
CO2: 23 mmol/L (ref 20–29)
Calcium: 9.4 mg/dL (ref 8.7–10.3)
Chloride: 101 mmol/L (ref 96–106)
Creatinine, Ser: 0.96 mg/dL (ref 0.57–1.00)
Globulin, Total: 2.7 g/dL (ref 1.5–4.5)
Glucose: 110 mg/dL — ABNORMAL HIGH (ref 70–99)
Potassium: 4 mmol/L (ref 3.5–5.2)
Sodium: 142 mmol/L (ref 134–144)
Total Protein: 6.8 g/dL (ref 6.0–8.5)
eGFR: 59 mL/min/{1.73_m2} — ABNORMAL LOW (ref >59)

## 2023-12-03 LAB — HEMOGLOBIN A1C
Est. average glucose Bld gHb Est-mCnc: 126 mg/dL
Hgb A1c MFr Bld: 6 % — ABNORMAL HIGH (ref 4.8–5.6)

## 2023-12-03 LAB — TSH: TSH: 2.13 u[IU]/mL (ref 0.450–4.500)

## 2023-12-03 LAB — VITAMIN D 25 HYDROXY (VIT D DEFICIENCY, FRACTURES): Vit D, 25-Hydroxy: 69.3 ng/mL (ref 30.0–100.0)

## 2023-12-03 NOTE — Progress Notes (Signed)
   12/03/2023  Patient ID: Miranda Moses, female   DOB: 1941-04-26, 83 y.o.   MRN: [id number]  Patient dropped of tax return, I requested from office to upload to EMR. Will coordinate with CPhT to have 1040 sent to [facility]. Patient ID included on each page. [id number]   Rolando Cliche, PharmD, BCGP Clinical Pharmacist  [phone]

## 2023-12-09 ENCOUNTER — Ambulatory Visit: Admitting: Cardiology

## 2023-12-10 NOTE — Telephone Encounter (Signed)
 Miranda Moses

## 2023-12-10 NOTE — Progress Notes (Signed)
   12/10/2023  Patient ID: Miranda Moses, female   DOB: 1940/12/24, 83 y.o.   MRN: [id number]  Spoke to [facility], states patient has spent $900 YTD on medications. Will fax spending report to office, attn Nickola Lenig pharmacist.   Rolando Cliche, PharmD, BCGP Clinical Pharmacist  [phone]

## 2023-12-15 ENCOUNTER — Other Ambulatory Visit: Payer: Self-pay | Admitting: Physician Assistant

## 2023-12-15 DIAGNOSIS — K219 Gastro-esophageal reflux disease without esophagitis: Secondary | ICD-10-CM

## 2023-12-23 ENCOUNTER — Other Ambulatory Visit: Payer: Self-pay | Admitting: Physician Assistant

## 2024-01-12 DIAGNOSIS — L578 Other skin changes due to chronic exposure to nonionizing radiation: Secondary | ICD-10-CM | POA: Diagnosis not present

## 2024-01-12 DIAGNOSIS — D0472 Carcinoma in situ of skin of left lower limb, including hip: Secondary | ICD-10-CM | POA: Diagnosis not present

## 2024-01-12 DIAGNOSIS — L821 Other seborrheic keratosis: Secondary | ICD-10-CM | POA: Diagnosis not present

## 2024-01-13 ENCOUNTER — Ambulatory Visit

## 2024-01-13 ENCOUNTER — Other Ambulatory Visit (HOSPITAL_COMMUNITY): Payer: Self-pay

## 2024-01-13 ENCOUNTER — Telehealth: Payer: Self-pay

## 2024-01-13 ENCOUNTER — Encounter: Payer: Medicare Other | Admitting: Physician Assistant

## 2024-01-13 VITALS — BP 118/71 | HR 80

## 2024-01-13 DIAGNOSIS — Z0000 Encounter for general adult medical examination without abnormal findings: Secondary | ICD-10-CM | POA: Diagnosis not present

## 2024-01-13 NOTE — Progress Notes (Signed)
 Subjective:   Miranda Moses is a 83 y.o. female who presents for Medicare Annual (Subsequent) preventive examination.  This wellness visit is conducted by a nurse.  The patient's medications were reviewed and reconciled since the patient's last visit.  History details were provided by the patient.  The history appears to be reliable.    Patient's last AWV was one year ago.   Medical History: Patient history and Family history was reviewed  Medications, Allergies, and preventative health maintenance was reviewed and updated.   Visit Complete: Virtual I connected with  Miranda Moses on 01/13/24 by a audio enabled telemedicine application and verified that I am speaking with the correct person using two identifiers.  Patient Location: Home  Provider Location: Office/Clinic  I discussed the limitations of evaluation and management by telemedicine. The patient expressed understanding and agreed to proceed.  Vital Signs: Because this visit was a virtual/telehealth visit, some criteria may be missing or patient reported. Any vitals not documented were not able to be obtained and vitals that have been documented are patient reported.  Cardiac Risk Factors include: advanced age (>25men, >2 women);diabetes mellitus;obesity (BMI >30kg/m2)     Objective:     Today's Vitals   01/13/24 0857  BP: 118/71  Pulse: 80  PainSc: 0-No pain  Patient was unable to self-report other vital signs due to a lack of equipment at home via telehealth There is no height or weight on file to calculate BMI.     01/13/2024    9:22 AM 01/09/2023    3:10 PM 10/03/2020   10:05 AM 07/23/2019    2:00 PM  Advanced Directives  Does Patient Have a Medical Advance Directive? No No No No  Would patient like information on creating a medical advance directive? No - Patient declined No - Patient declined No - Patient declined No - Patient declined    Current Medications (verified) Outpatient Encounter Medications as of  01/13/2024  Medication Sig   Aflibercept  (EYLEA ) 2 MG/0.05ML SOLN 2 mg by Intravitreal route as needed. Every nine weeks   apixaban  (ELIQUIS ) 5 MG TABS tablet Take 1 tablet (5 mg total) by mouth 2 (two) times daily.   Ascorbic Acid (VITAMIN C) 100 MG tablet Take 100 mg by mouth daily.   atorvastatin  (LIPITOR) 20 MG tablet TAKE 1 TABLET BY MOUTH EVERY DAY   calcium  carbonate (OSCAL) 1500 (600 Ca) MG TABS tablet Take 600 mg of elemental calcium  by mouth daily.   Cholecalciferol (VITAMIN D3 PO) Take 2 capsules by mouth daily.   isosorbide  mononitrate (IMDUR ) 30 MG 24 hr tablet TAKE 1 TABLET BY MOUTH EVERY DAY   loratadine  (CLARITIN ) 10 MG tablet Take 1 tablet (10 mg total) by mouth daily as needed for allergies.   metoprolol  tartrate (LOPRESSOR ) 25 MG tablet TAKE 1 TABLET(25 MG) BY MOUTH TWICE DAILY   Multiple Vitamin (MULTIVITAMIN WITH MINERALS) TABS tablet Take 1 tablet by mouth daily.   Multiple Vitamins-Minerals (OCUVITE PRESERVISION PO) Take 1 tablet by mouth in the morning and at bedtime.   nitroGLYCERIN  (NITROSTAT ) 0.4 MG SL tablet Place 1 tablet (0.4 mg total) under the tongue every 5 (five) minutes as needed for chest pain.   omeprazole  (PRILOSEC) 20 MG capsule TAKE 1 CAPSULE BY MOUTH EVERY DAY   potassium chloride  SA (KLOR-CON  M20) 20 MEQ tablet TAKE 1 TABLET BY MOUTH EVERY DAY   tobramycin (TOBREX) 0.3 % ophthalmic solution PLEASE SEE ATTACHED FOR DETAILED DIRECTIONS   torsemide  (DEMADEX ) 20 MG tablet Take  1 tablet (20 mg total) by mouth daily.   triamcinolone  (NASACORT ) 55 MCG/ACT AERO nasal inhaler Place 1 spray into the nose daily as needed for allergies (allergies).   vitamin E 200 UNIT capsule Take 200 Units by mouth daily.   No facility-administered encounter medications on file as of 01/13/2024.    Allergies (verified) Clarithromycin, Codeine, and Penicillins   History: Past Medical History:  Diagnosis Date   Angina pectoris (HCC) 02/18/2018   CAD in native artery    by CT  coronary 10/2018 >> medical therapy    CHF (congestive heart failure), NYHA class I, chronic, diastolic (HCC) 08/09/2019   Chronic diastolic (congestive) heart failure (HCC)    Coronary artery disease of native artery of native heart with stable angina pectoris (HCC) 08/09/2019   COVID-19 virus infection    Encounter for vaccination 10/03/2020   Family history of coronary artery disease 01/30/2018   Gastro-esophageal reflux disease without esophagitis 01/30/2018   GERD (gastroesophageal reflux disease)    Health maintenance examination 10/03/2020   History of pulmonary embolism 08/23/2019   Hyperlipidemia    Hypertension    Hypertensive heart disease with heart failure (HCC) 01/30/2018   Mixed hyperlipidemia 02/18/2018   New onset atrial fibrillation (HCC)    Other fatigue 02/10/2020   Other specified menopausal and perimenopausal disorders 02/10/2020   PAD (peripheral artery disease) (HCC) 08/28/2018   PAH (pulmonary artery hypertension) (HCC) 08/28/2018   Pre-diabetes    Prediabetes 02/10/2020   Pulmonary embolus (HCC)    Pulmonary hypertension (HCC)    Vitamin D  deficiency 08/09/2019   Past Surgical History:  Procedure Laterality Date   ABDOMINAL HYSTERECTOMY     CATARACT EXTRACTION Left 03/2014   CATARACT EXTRACTION Right 05/2014   COLONOSCOPY  11/05/2013   Family History  Problem Relation Age of Onset   Heart disease Father    Social History   Socioeconomic History   Marital status: Widowed    Spouse name: Not on file   Number of children: 1   Years of education: 38   Highest education level: 12th grade  Occupational History   Occupation: retired  Tobacco Use   Smoking status: Never   Smokeless tobacco: Never  Vaping Use   Vaping status: Never Used  Substance and Sexual Activity   Alcohol use: Never   Drug use: Never   Sexual activity: Not Currently    Birth control/protection: Abstinence  Other Topics Concern   Not on file  Social History Narrative   Not on file   Social  Drivers of Health   Financial Resource Strain: Low Risk  (01/13/2024)   Overall Financial Resource Strain (CARDIA)    Difficulty of Paying Living Expenses: Not very hard  Food Insecurity: No Food Insecurity (01/13/2024)   Hunger Vital Sign    Worried About Running Out of Food in the Last Year: Never true    Ran Out of Food in the Last Year: Never true  Transportation Needs: No Transportation Needs (01/13/2024)   PRAPARE - Administrator, Civil Service (Medical): No    Lack of Transportation (Non-Medical): No  Physical Activity: Insufficiently Active (01/13/2024)   Exercise Vital Sign    Days of Exercise per Week: 3 days    Minutes of Exercise per Session: 20 min  Stress: No Stress Concern Present (01/13/2024)   [employer] of Occupational Health - Occupational Stress Questionnaire    Feeling of Stress: Not at all  Social Connections: Moderately Integrated (01/13/2024)  Social Connection and Isolation Panel    Frequency of Communication with Friends and Family: More than three times a week    Frequency of Social Gatherings with Friends and Family: More than three times a week    Attends Religious Services: More than 4 times per year    Active Member of [employer] or Organizations: Yes    Attends Banker Meetings: More than 4 times per year    Marital Status: Widowed    Tobacco Counseling Counseling given: Patient is not a smoker  Clinical Intake:  Pre-visit preparation completed: Yes Pain : No/denies pain Pain Score: 0-No pain   BMI - recorded: 40.76 Nutritional Status: BMI > 30  Obese Nutritional Risks: None Diabetes: Yes CBG done?: No How often do you need to have someone help you when you read instructions, pamphlets, or other written materials from your doctor or pharmacy?: 1 - Never Interpreter Needed?: No    Activities of Daily Living    01/13/2024    9:10 AM  In your present state of health, do you have any difficulty performing the  following activities:  Hearing? 0  Vision? 1  Difficulty concentrating or making decisions? 0  Walking or climbing stairs? 0  Dressing or bathing? 0  Doing errands, shopping? 0  Preparing Food and eating ? N  Using the Toilet? N  In the past six months, have you accidently leaked urine? N  Do you have problems with loss of bowel control? N  Managing your Medications? N  Managing your Finances? N  Housekeeping or managing your Housekeeping? N    Patient Care Team: Nicholaus Credit, DEVONNA as PCP - General (Physician Assistant)     Assessment:   This is a routine wellness examination for Vadie.  Hearing/Vision screen No results found.   Depression Screen    01/13/2024    9:19 AM 12/02/2023    1:14 PM 07/28/2023   11:12 AM 03/27/2023   11:26 AM 01/09/2023    3:09 PM 11/20/2022   10:52 AM 02/20/2022    3:04 PM  PHQ 2/9 Scores  PHQ - 2 Score 0 0 0 0 0 0 0  PHQ- 9 Score     0 0 0    Fall Risk    01/13/2024    9:09 AM 12/02/2023    1:14 PM 07/28/2023   11:12 AM 03/27/2023   11:26 AM 01/09/2023    3:10 PM  Fall Risk   Falls in the past year? 0 0 0 0 0  Number falls in past yr: 0 0 0 0 0  Injury with Fall? 0 0  0 0  Risk for fall due to : No Fall Risks History of fall(s);Impaired balance/gait;No Fall Risks No Fall Risks No Fall Risks No Fall Risks  Follow up Falls evaluation completed Falls evaluation completed Falls evaluation completed Falls evaluation completed Falls prevention discussed    MEDICARE RISK AT HOME: Medicare Risk at Home Any stairs in or around the home?: No If so, are there any without handrails?: No Home free of loose throw rugs in walkways, pet beds, electrical cords, etc?: Yes Adequate lighting in your home to reduce risk of falls?: Yes Life alert?: No Use of a cane, walker or w/c?: No Grab bars in the bathroom?: No Shower chair or bench in shower?: Yes Elevated toilet seat or a handicapped toilet?: No  TIMED UP AND GO:  Was the test performed?  No     Cognitive Function:  01/13/2024    9:23 AM 01/09/2023    3:10 PM 02/20/2022    3:12 PM  6CIT Screen  What Year? 0 points 0 points 0 points  What month? 0 points 0 points 0 points  What time? 0 points 0 points 0 points  Count back from 20 0 points 0 points 0 points  Months in reverse 0 points 0 points 0 points  Repeat phrase 0 points 0 points 0 points  Total Score 0 points 0 points 0 points    Immunizations Immunization History  Administered Date(s) Administered   Fluad Quad(high Dose 65+) 03/23/2020, 02/19/2021   Fluad Trivalent(High Dose 65+) 03/27/2023   Influenza-Unspecified 03/13/2017, 06/24/2018, 05/07/2019   PFIZER(Purple Top)SARS-COV-2 Vaccination 12/09/2019, 12/31/2019   Pneumococcal Conjugate-13 04/13/2014   Pneumococcal Polysaccharide-23 08/21/2021   Pneumococcal-Unspecified 05/31/2009   Zoster Recombinant(Shingrix) 10/25/2020   Zoster, Live 02/11/2014    TDAP status: Due, Education has been provided regarding the importance of this vaccine. Advised may receive this vaccine at local pharmacy or Health Dept. Aware to provide a copy of the vaccination record if obtained from local pharmacy or Health Dept. Verbalized acceptance and understanding.  Flu Vaccine status: Up to date  Pneumococcal vaccine status: Up to date  Covid-19 vaccine status: Declined, Education has been provided regarding the importance of this vaccine but patient still declined. Advised may receive this vaccine at local pharmacy or Health Dept.or vaccine clinic. Aware to provide a copy of the vaccination record if obtained from local pharmacy or Health Dept. Verbalized acceptance and understanding.  Qualifies for Shingles Vaccine? Yes   Zostavax completed No   Shingrix Completed?: No.    Education has been provided regarding the importance of this vaccine. Patient has been advised to call insurance company to determine out of pocket expense if they have not yet received this vaccine. Advised  may also receive vaccine at local pharmacy or Health Dept. Verbalized acceptance and understanding.  Screening Tests Health Maintenance  Topic Date Due   OPHTHALMOLOGY EXAM  Never done   Diabetic kidney evaluation - Urine ACR  Never done   Medicare Annual Wellness (AWV)  01/09/2024   Zoster Vaccines- Shingrix (2 of 2) 03/03/2024 (Originally 12/20/2020)   DTaP/Tdap/Td (1 - Tdap) 07/17/2024 (Originally 09/18/1959)   INFLUENZA VACCINE  01/23/2024   HEMOGLOBIN A1C  06/02/2024   MAMMOGRAM  10/07/2024   Diabetic kidney evaluation - eGFR measurement  12/01/2024   FOOT EXAM  12/01/2024   DEXA SCAN  08/20/2025   Pneumococcal Vaccine: 50+ Years  Completed   Hepatitis B Vaccines  Aged Out   HPV VACCINES  Aged Out   Meningococcal B Vaccine  Aged Out   COVID-19 Vaccine  Discontinued    Health Maintenance  Health Maintenance Due  Topic Date Due   OPHTHALMOLOGY EXAM  Never done   Diabetic kidney evaluation - Urine ACR  Never done   Medicare Annual Wellness (AWV)  01/09/2024    Colorectal cancer screening: No longer required.   Mammogram status: Completed 2025. Repeat every year  Bone Density status: Completed 2025.  Lung Cancer Screening: (Low Dose CT Chest recommended if Age 14-80 years, 20 pack-year currently smoking OR have quit w/in 15years.) does not qualify.   Lung Cancer Screening Referral: N/A  Additional Screening:  Hepatitis C Screening: does not qualify  Vision Screening: Recommended annual ophthalmology exams for early detection of glaucoma and other disorders of the eye. Is the patient up to date with their annual eye exam?  No  -  Patient has an appointment scheduled  Dental Screening: Recommended annual dental exams for proper oral hygiene  Community Resource Referral / Chronic Care Management: CRR required this visit?  No   CCM required this visit?  No     Plan:    1- Patient has an eye exam scheduled however the first available isn't until next year 2-  Patient will get Shingrix and Tetanus vaccines at the pharmacy 3- Patient will bring in out of pocket expense report from her pharmacy so we can re-apply for Eliquis .  I will also check with the pharmacy team to see if she can get it any cheaper from a [facility]  I have personally reviewed and noted the following in the patient's chart:   Medical and social history Use of alcohol, tobacco or illicit drugs  Current medications and supplements including opioid prescriptions. Patient is not currently taking opioid prescriptions. Functional ability and status Nutritional status Physical activity Advanced directives List of other physicians Hospitalizations, surgeries, and ER visits in previous 12 months Vitals Screenings to include cognitive, depression, and falls Referrals and appointments  In addition, I have reviewed and discussed with patient certain preventive protocols, quality metrics, and best practice recommendations. A written personalized care plan for preventive services as well as general preventive health recommendations were provided to patient.     Suzen CHRISTELLA Sharps, LPN   [id number]   After Visit Summary: (Mail) Due to this being a telephonic visit, the after visit summary with patients personalized plan was offered to patient via mail

## 2024-01-13 NOTE — Patient Instructions (Signed)
 [name] , Thank you for taking time to come for your Medicare Wellness Visit. I appreciate your ongoing commitment to your health goals. Please review the following plan we discussed and let me know if I can assist you in the future.    This is a list of the screening recommended for you and due dates:  Health Maintenance  Topic Date Due   Eye exam for diabetics  Never done   Yearly kidney health urinalysis for diabetes  Never done   Zoster (Shingles) Vaccine (2 of 2) 03/03/2024*   DTaP/Tdap/Td vaccine (1 - Tdap) 07/17/2024*   Flu Shot  01/23/2024   Hemoglobin A1C  06/02/2024   Mammogram  10/07/2024   Yearly kidney function blood test for diabetes  12/01/2024   Complete foot exam   12/01/2024   Medicare Annual Wellness Visit  01/12/2025   DEXA scan (bone density measurement)  08/20/2025   Pneumococcal Vaccine for age over 64  Completed   Hepatitis B Vaccine  Aged Out   HPV Vaccine  Aged Out   Meningitis B Vaccine  Aged Out   COVID-19 Vaccine  Discontinued  *Topic was postponed. The date shown is not the original due date.   Don't forget to get your Shingles and Tetanus vaccines at the pharmacy.  Preventive Care 81 Years and Older, Female Preventive care refers to lifestyle choices and visits with your health care provider that can promote health and wellness. What does preventive care include? A yearly physical exam. This is also called an annual well check. Dental exams once or twice a year. Routine eye exams. Ask your health care provider how often you should have your eyes checked. Personal lifestyle choices, including: Daily care of your teeth and gums. Regular physical activity. Eating a healthy diet. Avoiding tobacco and drug use. Limiting alcohol use. Practicing safe sex. Taking low-dose aspirin  every day. Taking vitamin and mineral supplements as recommended by your health care provider. What happens during an annual well check? The services and screenings done by  your health care provider during your annual well check will depend on your age, overall health, lifestyle risk factors, and family history of disease. Counseling  Your health care provider may ask you questions about your: Alcohol use. Tobacco use. Drug use. Emotional well-being. Home and relationship well-being. Sexual activity. Eating habits. History of falls. Memory and ability to understand (cognition). Work and work Astronomer. Reproductive health. Screening  You may have the following tests or measurements: Height, weight, and BMI. Blood pressure. Lipid and cholesterol levels. These may be checked every 5 years, or more frequently if you are over 52 years old. Skin check. Lung cancer screening. You may have this screening every year starting at age 81 if you have a 30-pack-year history of smoking and currently smoke or have quit within the past 15 years. Fecal occult blood test (FOBT) of the stool. You may have this test every year starting at age 81. Flexible sigmoidoscopy or colonoscopy. You may have a sigmoidoscopy every 5 years or a colonoscopy every 10 years starting at age 81. Hepatitis C blood test. Hepatitis B blood test. Sexually transmitted disease (STD) testing. Diabetes screening. This is done by checking your blood sugar (glucose) after you have not eaten for a while (fasting). You may have this done every 1-3 years. Bone density scan. This is done to screen for osteoporosis. You may have this done starting at age 81. Mammogram. This may be done every 1-2 years. Talk to your  health care provider about how often you should have regular mammograms. Talk with your health care provider about your test results, treatment options, and if necessary, the need for more tests. Vaccines  Your health care provider may recommend certain vaccines, such as: Influenza vaccine. This is recommended every year. Tetanus, diphtheria, and acellular pertussis (Tdap, Td) vaccine. You  may need a Td booster every 10 years. Zoster vaccine. You may need this after age 69. Pneumococcal 13-valent conjugate (PCV13) vaccine. One dose is recommended after age 81. Pneumococcal polysaccharide (PPSV23) vaccine. One dose is recommended after age 81. Talk to your health care provider about which screenings and vaccines you need and how often you need them. This information is not intended to replace advice given to you by your health care provider. Make sure you discuss any questions you have with your health care provider. Document Released: 07/07/2015 Document Revised: 02/28/2016 Document Reviewed: 04/11/2015 Elsevier Interactive Patient Education  2017 [employer].  Fall Prevention in the Home Falls can cause injuries. They can happen to people of all ages. There are many things you can do to make your home safe and to help prevent falls. What can I do on the outside of my home? Regularly fix the edges of walkways and driveways and fix any cracks. Remove anything that might make you trip as you walk through a door, such as a raised step or threshold. Trim any bushes or trees on the path to your home. Use bright outdoor lighting. Clear any walking paths of anything that might make someone trip, such as rocks or tools. Regularly check to see if handrails are loose or broken. Make sure that both sides of any steps have handrails. Any raised decks and porches should have guardrails on the edges. Have any leaves, snow, or ice cleared regularly. Use sand or salt on walking paths during winter. Clean up any spills in your garage right away. This includes oil or grease spills. What can I do in the bathroom? Use night lights. Install grab bars by the toilet and in the tub and shower. Do not use towel bars as grab bars. Use non-skid mats or decals in the tub or shower. If you need to sit down in the shower, use a plastic, non-slip stool. Keep the floor dry. Clean up any water that spills  on the floor as soon as it happens. Remove soap buildup in the tub or shower regularly. Attach bath mats securely with double-sided non-slip rug tape. Do not have throw rugs and other things on the floor that can make you trip. What can I do in the bedroom? Use night lights. Make sure that you have a light by your bed that is easy to reach. Do not use any sheets or blankets that are too big for your bed. They should not hang down onto the floor. Have a firm chair that has side arms. You can use this for support while you get dressed. Do not have throw rugs and other things on the floor that can make you trip. What can I do in the kitchen? Clean up any spills right away. Avoid walking on wet floors. Keep items that you use a lot in easy-to-reach places. If you need to reach something above you, use a strong step stool that has a grab bar. Keep electrical cords out of the way. Do not use floor polish or wax that makes floors slippery. If you must use wax, use non-skid floor wax. Do not have  throw rugs and other things on the floor that can make you trip. What can I do with my stairs? Do not leave any items on the stairs. Make sure that there are handrails on both sides of the stairs and use them. Fix handrails that are broken or loose. Make sure that handrails are as long as the stairways. Check any carpeting to make sure that it is firmly attached to the stairs. Fix any carpet that is loose or worn. Avoid having throw rugs at the top or bottom of the stairs. If you do have throw rugs, attach them to the floor with carpet tape. Make sure that you have a light switch at the top of the stairs and the bottom of the stairs. If you do not have them, ask someone to add them for you. What else can I do to help prevent falls? Wear shoes that: Do not have high heels. Have rubber bottoms. Are comfortable and fit you well. Are closed at the toe. Do not wear sandals. If you use a stepladder: Make  sure that it is fully opened. Do not climb a closed stepladder. Make sure that both sides of the stepladder are locked into place. Ask someone to hold it for you, if possible. Clearly mark and make sure that you can see: Any grab bars or handrails. First and last steps. Where the edge of each step is. Use tools that help you move around (mobility aids) if they are needed. These include: Canes. Walkers. Scooters. Crutches. Turn on the lights when you go into a dark area. Replace any light bulbs as soon as they burn out. Set up your furniture so you have a clear path. Avoid moving your furniture around. If any of your floors are uneven, fix them. If there are any pets around you, be aware of where they are. Review your medicines with your doctor. Some medicines can make you feel dizzy. This can increase your chance of falling. Ask your doctor what other things that you can do to help prevent falls. This information is not intended to replace advice given to you by your health care provider. Make sure you discuss any questions you have with your health care provider. Document Released: 04/06/2009 Document Revised: 11/16/2015 Document Reviewed: 07/15/2014 Elsevier Interactive Patient Education  2017 [employer].

## 2024-01-13 NOTE — Telephone Encounter (Signed)
 Pharmacy Patient Advocate Encounter  Insurance verification completed.   The patient is insured through [employer] test claim for Eliquis . Currently a quantity of 60 is a 30 day supply and the co-pay is $107.24   This test claim was processed through [facility]- copay amounts may vary at other pharmacies due to pharmacy/plan contracts, or as the patient moves through the different stages of their insurance plan.

## 2024-01-13 NOTE — Telephone Encounter (Signed)
 Jacob - can you do a test claim to see if Eliquis  will be any cheaper if she gets it thru [employer] order?  She may be interested in moving all of her medications there if so.  She last paid $119 for a 30 day supply of Eliquis  at [facility].  Thanks!

## 2024-01-21 DIAGNOSIS — H26493 Other secondary cataract, bilateral: Secondary | ICD-10-CM | POA: Diagnosis not present

## 2024-01-21 DIAGNOSIS — H353124 Nonexudative age-related macular degeneration, left eye, advanced atrophic with subfoveal involvement: Secondary | ICD-10-CM | POA: Diagnosis not present

## 2024-01-21 DIAGNOSIS — H43813 Vitreous degeneration, bilateral: Secondary | ICD-10-CM | POA: Diagnosis not present

## 2024-01-21 DIAGNOSIS — H353211 Exudative age-related macular degeneration, right eye, with active choroidal neovascularization: Secondary | ICD-10-CM | POA: Diagnosis not present

## 2024-01-24 NOTE — Progress Notes (Unsigned)
 Cardiology Office Note:    Date:  01/26/2024   ID:  Miranda Moses, DOB 08-10-1940, MRN [id number]  PCP:  Miranda Credit, PA-C  Cardiologist:  Miranda Leiter, MD    Referring MD: Miranda Credit, PA-C    ASSESSMENT:    1. Hypertensive heart disease with heart failure (HCC)   2. Longstanding persistent atrial fibrillation (HCC)   3. Chronic anticoagulation   4. Coronary artery disease of native artery of native heart with stable angina pectoris (HCC)   5. Mixed hyperlipidemia    PLAN:    In order of problems listed above:  Miranda Moses is doing well with medical therapy for her heart problems including hypertensive heart disease with compensated heart failure.  Labs are followed with the primary care physician in stable continue her current loop diuretic along with beta-blocker for heart rate control. Continue her current anticoagulant for atrial fibrillation beta-blocker for heart rate control Stable CAD no anginal discomfort on good medical therapy she has a prescription for nitroglycerin  if needed not used lipid-lowering with her high intensity statin and beta-blocker At this time I would not repeat an ischemia evaluation without symptoms Continue her current statin   Next appointment: 1 year   Medication Adjustments/Labs and Tests Ordered: Current medicines are reviewed at length with the patient today.  Concerns regarding medicines are outlined above.  Orders Placed This Encounter  Procedures   EKG 12-Lead   No orders of the defined types were placed in this encounter.    History of Present Illness:    Miranda Moses is a 83 y.o. female with a hx of hypertensive heart disease with diastolic heart failure and persistent atrial fibrillation with rate control and anticoagulation with PPI prophylaxis history of GERD coronary artery disease with a calcium  score of 1245 greater than 70% stenosis proximal right coronary and moderate stenosis proximal LAD and left circumflex coronary artery on CT in  2020 and hyperlipidemia last seen 11/28/2022. Compliance with diet, lifestyle and medications: Yes  Miranda Moses has had a great year she has had no hospitalizations or major health problems Tolerates her anticoagulant without mucosal bleeding or GI upset Has mild exertional shortness of breath not worse than it has been she does not have edema orthopnea she is pleased with the quality of her life is compliant with and tolerates her diuretic She is had no anginal discomfort and tolerates her lipid-lowering therapy without muscle pain or weakness  Past Medical History:  Diagnosis Date   Angina pectoris (HCC) 02/18/2018   CAD in native artery    by CT coronary 10/2018 >> medical therapy    CHF (congestive heart failure), NYHA class I, chronic, diastolic (HCC) 08/09/2019   Chronic diastolic (congestive) heart failure (HCC)    Coronary artery disease of native artery of native heart with stable angina pectoris (HCC) 08/09/2019   COVID-19 virus infection    Encounter for vaccination 10/03/2020   Family history of coronary artery disease 01/30/2018   Gastro-esophageal reflux disease without esophagitis 01/30/2018   GERD (gastroesophageal reflux disease)    Health maintenance examination 10/03/2020   History of pulmonary embolism 08/23/2019   Hyperlipidemia    Hypertension    Hypertensive heart disease with heart failure (HCC) 01/30/2018   Mixed hyperlipidemia 02/18/2018   New onset atrial fibrillation (HCC)    Other fatigue 02/10/2020   Other specified menopausal and perimenopausal disorders 02/10/2020   PAD (peripheral artery disease) (HCC) 08/28/2018   PAH (pulmonary artery hypertension) (HCC) 08/28/2018   Pre-diabetes  Prediabetes 02/10/2020   Pulmonary embolus (HCC)    Pulmonary hypertension (HCC)    Vitamin D  deficiency 08/09/2019    Current Medications: Current Meds  Medication Sig   Aflibercept  (EYLEA ) 2 MG/0.05ML SOLN 2 mg by Intravitreal route as needed. Every nine weeks   apixaban  (ELIQUIS ) 5 MG  TABS tablet Take 1 tablet (5 mg total) by mouth 2 (two) times daily.   Ascorbic Acid (VITAMIN C) 100 MG tablet Take 100 mg by mouth daily.   atorvastatin  (LIPITOR) 20 MG tablet TAKE 1 TABLET BY MOUTH EVERY DAY   calcium  carbonate (OSCAL) 1500 (600 Ca) MG TABS tablet Take 600 mg of elemental calcium  by mouth daily.   Cholecalciferol (VITAMIN D3 PO) Take 2 capsules by mouth daily.   isosorbide  mononitrate (IMDUR ) 30 MG 24 hr tablet TAKE 1 TABLET BY MOUTH EVERY DAY   loratadine  (CLARITIN ) 10 MG tablet Take 1 tablet (10 mg total) by mouth daily as needed for allergies.   metoprolol  tartrate (LOPRESSOR ) 25 MG tablet TAKE 1 TABLET(25 MG) BY MOUTH TWICE DAILY   Multiple Vitamin (MULTIVITAMIN WITH MINERALS) TABS tablet Take 1 tablet by mouth daily.   Multiple Vitamins-Minerals (OCUVITE PRESERVISION PO) Take 1 tablet by mouth in the morning and at bedtime.   nitroGLYCERIN  (NITROSTAT ) 0.4 MG SL tablet Place 1 tablet (0.4 mg total) under the tongue every 5 (five) minutes as needed for chest pain.   omeprazole  (PRILOSEC) 20 MG capsule TAKE 1 CAPSULE BY MOUTH EVERY DAY   potassium chloride  SA (KLOR-CON  M20) 20 MEQ tablet TAKE 1 TABLET BY MOUTH EVERY DAY   tobramycin (TOBREX) 0.3 % ophthalmic solution PLEASE SEE ATTACHED FOR DETAILED DIRECTIONS   torsemide  (DEMADEX ) 20 MG tablet Take 1 tablet (20 mg total) by mouth daily.   triamcinolone  (NASACORT ) 55 MCG/ACT AERO nasal inhaler Place 1 spray into the nose daily as needed for allergies (allergies).   vitamin E 200 UNIT capsule Take 200 Units by mouth daily.      EKGs/Labs/Other Studies Reviewed:    The following studies were reviewed today:  Cardiac Studies & Procedures   ______________________________________________________________________________________________     ECHOCARDIOGRAM  ECHOCARDIOGRAM LIMITED 07/23/2019  Narrative ECHOCARDIOGRAM LIMITED REPORT    Patient Name:   Miranda Moses Date of Exam: 07/23/2019 Medical Rec #:  [id number]   Height:       65.0 in Accession #:    [id number] Weight:       249.2 lb Date of Birth:  22-Feb-1941  BSA:          2.17 m Patient Age:    78 years   BP:           115/91 mmHg Patient Gender: F          HR:           103 bpm. Exam Location:  Inpatient   Procedure: Limited Echo, Cardiac Doppler and Limited Color Doppler  Indications:    I26.02 Pulmonary embolus  History:        Patient has prior history of Echocardiogram examinations, most recent 10/14/2018.  Sonographer:    Annabella Dance Referring Phys: [id number] TIMOTHY S OPYD  IMPRESSIONS   1. Left ventricular ejection fraction, by visual estimation, is 55 to 60%. 2. Global right ventricle has moderately reduced systolic function.The right ventricular size is moderately enlarged. no increase in right ventricular wall thickness. 3. The RV is moderately dilated with moderately reduced RV function. 4. Right atrial size was severely dilated. 5. The mitral valve is  grossly normal. Mild mitral valve regurgitation. 6. The tricuspid valve was normal in structure. Tricuspid valve regurgitation is mild. 7. Tricuspid valve regurgitation is mild. 8. The pulmonic valve was normal in structure. Pulmonic valve regurgitation is mild by color flow Doppler. 9. Mildly elevated pulmonary artery systolic pressure.  FINDINGS Left Ventricle: Left ventricular ejection fraction, by visual estimation, is 55 to 60%.  Right Ventricle: The right ventricular size is moderately enlarged. No increase in right ventricular wall thickness. Global RV systolic function is has moderately reduced systolic function. The tricuspid regurgitant velocity is 2.33 m/s, and with an assumed right atrial pressure of 15 mmHg, the estimated right ventricular systolic pressure is mildly elevated at 36.8 mmHg. The RV is moderately dilated with moderately reduced RV function.  Right Atrium: Right atrial size was severely dilated. Right atrial pressure is estimated at 15 mmHg.  Mitral  Valve: The mitral valve is grossly normal. MV Area by PHT, 3.77 cm. MV PHT, 58.29 msec. Mild mitral valve regurgitation.  Tricuspid Valve: The tricuspid valve is normal in structure. Tricuspid valve regurgitation is mild.  Pulmonic Valve: The pulmonic valve was normal in structure. Pulmonic valve regurgitation is mild by color flow Doppler. Pulmonic regurgitation is mild by color flow Doppler.  Aorta: The aortic root and ascending aorta are structurally normal, with no evidence of dilitation.   LEFT VENTRICLE          Normals PLAX 2D LVIDd:         3.69 cm  3.6 cm LVIDs:         2.81 cm  1.7 cm LV PW:         1.37 cm  1.4 cm LV IVS:        1.02 cm  1.3 cm LVOT diam:     1.90 cm  2.0 cm LV SV:         28 ml    79 ml LV SV Index:   11.96    45 ml/m2 LVOT Area:     2.84 cm 3.14 cm2   RIGHT VENTRICLE          IVC RV Basal diam:  3.55 cm  IVC diam: 2.57 cm RV Mid diam:    3.17 cm TAPSE (M-mode): 1.2 cm  LEFT ATRIUM             Index       RIGHT ATRIUM           Index LA diam:        4.30 cm 1.98 cm/m  RA Area:     30.90 cm LA Vol (A2C):   67.1 ml 30.89 ml/m RA Volume:   111.00 ml 51.11 ml/m LA Vol (A4C):   68.8 ml 31.68 ml/m LA Biplane Vol: 68.0 ml 31.31 ml/m AORTIC VALVE             Normals LVOT Vmax:   81.10 cm/s LVOT Vmean:  51.400 cm/s 75 cm/s LVOT VTI:    0.140 m     25.3 cm  AORTA                 Normals Ao Root diam: 2.90 cm 31 mm Ao Asc diam:  2.30 cm 31 mm  MITRAL VALVE              Normals   TRICUSPID VALVE             Normals MV Area (PHT): 3.77 cm  TR Peak grad:   21.8 mmHg MV PHT:        58.29 msec 55 ms     TR Vmax:        261.00 cm/s 288 cm/s MV Decel Time: 201 msec   187 ms MV E velocity: 123.00 cm/s 103 cm/s SHUNTS Systemic VTI:  0.14 m Systemic Diam: 1.90 cm   Aleene Passe MD Electronically signed by Aleene Passe MD Signature Date/Time: 07/23/2019/5:33:58 PMThe mitral valve is grossly normal.    Final      CT SCANS  CT  CORONARY FRACTIONAL FLOW RESERVE DATA PREP 11/06/2018  Narrative EXAM: CT FFR ANALYSIS  CLINICAL DATA:  83 year old female with abnormal coronary CTA.  FINDINGS: FFRct analysis was performed on the original cardiac CT angiogram dataset. Diagrammatic representation of the FFRct analysis is provided in a separate PDF document in PACS. This dictation was created using the PDF document and an interactive 3D model of the results. 3D model is not available in the EMR/PACS. Normal FFR range is >0.80.  1. Left Main:  No significant stenosis.  2. LAD: No significant stenosis. 3. LCX: Proximal: 0.91, distal: 0.74. 4. RCA: No significant stenosis.  IMPRESSION: 1. CT FFR analysis showed significant stenosis in the distal portion of a small non-dominant LCX artery. An aggressive medical management is recommended.   Electronically Signed By: Leim Moose On: 11/11/2018 19:44   CT CORONARY MORPH W/CTA COR W/SCORE 11/06/2018  Addendum 11/11/2018  3:15 PM ADDENDUM REPORT: 11/11/2018 15:12  CLINICAL DATA:  83 year old female with hyperlipemia, hypertension and chest pain.  EXAM: Cardiac/Coronary  CT  TECHNIQUE: The patient was scanned on a [employer].  FINDINGS: A 120 kV prospective scan was triggered in the descending thoracic aorta at 111 HU's. Axial non-contrast 3 mm slices were carried out through the heart. The data set was analyzed on a dedicated work station and scored using the Agatson method. Gantry rotation speed was 250 msecs and collimation was .6 mm. No beta blockade and 0.8 mg of sl NTG was given. The 3D data set was reconstructed in 5% intervals of the 67-82 % of the R-R cycle. Diastolic phases were analyzed on a dedicated work station using MPR, MIP and VRT modes. The patient received 80 cc of contrast.  Aorta: Normal size. Moderate diffuse atherosclerotic plaque and calcifications. No dissection.  Aortic Valve:  Trileaflet.  Trivial  calcifications.  Coronary Arteries:  Normal coronary origin.  Right dominance.  RCA is a medium caliber dominant artery that gives rise to poorly visualized PDA and PLA. There is severe calcified plaque in the ostial portion of RCA extending into the proximal portion with stenosis > 70%. Mid and distal RCA have only minimal plaque.  Left main is a short artery that gives rise to LAD and LCX arteries. There is mild calcified plaque with associated stenosis 25-50%.  LAD is a medium size vessel. There is a moderate predominantly calcified plaque in the proximal LAD with associated stenosis 50-69%. Mid and distal LAD has mild diffuse calcified plaque with stenosis 25-49%.  LCX is a non-dominant artery that gives rise to one small OM1 branch. There is moderate calcified plaque in the proximal portion with stenosis 50-69%, mid, distal LCx artery and OM1 have small lumen and mild diffuse predominantly calcified plaque with stenosis 25-49%.  Other findings:  Normal pulmonary vein drainage into the left atrium.  Normal let atrial appendage without a thrombus.  Normal size of the pulmonary artery.  IMPRESSION: 1. Coronary calcium   score of 1245. This was 95 percentile for age and sex matched control.  2. Normal coronary origin with right dominance.  3. Diffuse plaque with > 70% stenosis in the ostial/proximal RCA and moderate stenosis in the proximal LAD and LCX arteries. Additional analysis with CT FFR will be submitted.   Electronically Signed By: Leim Moose On: 11/11/2018 15:12  Narrative EXAM: OVER-READ INTERPRETATION  CT CHEST  The following report is an over-read performed by radiologist Dr. Norleen Kill [facility] on 11/06/2018. This over-read does not include interpretation of cardiac or coronary anatomy or pathology. The coronary calcium  score/coronary CTA interpretation by the cardiologist is attached.  COMPARISON:  None.  FINDINGS: The  visualized portions of the lower lung fields show no suspicious nodules, masses, or infiltrates. A small to moderate hiatal hernia is noted.  IMPRESSION: Small to moderate hiatal hernia.  Electronically Signed: By: Norleen Kil M.D. On: 11/06/2018 15:16     ______________________________________________________________________________________________          Recent Labs: 12/02/2023: ALT 6; BUN 17; Creatinine, Ser 0.96; Hemoglobin 13.3; Platelets 202; Potassium 4.0; Sodium 142; TSH 2.130  Recent Lipid Panel    Component Value Date/Time   CHOL 153 12/02/2023 1338   TRIG 113 12/02/2023 1338   HDL 47 12/02/2023 1338   CHOLHDL 3.3 12/02/2023 1338   LDLCALC 86 12/02/2023 1338    Physical Exam:    VS:  BP 120/80   Pulse 88   Ht 5' 5.5 (1.664 m)   Wt 249 lb (112.9 kg)   LMP  (LMP Unknown)   SpO2 97%   BMI 40.81 kg/m    Heart rate 88 bpm oxygen sat 97% blood sugar 120/80 right upper extremity normal cuff sitting with a controlled ventricular rate Wt Readings from Last 3 Encounters:  01/26/24 249 lb (112.9 kg)  12/02/23 248 lb 12.8 oz (112.9 kg)  09/26/23 253 lb (114.8 kg)     GEN: Obese BMI exceeds 40 well nourished, well developed in no acute distress HEENT: Normal NECK: No JVD; No carotid bruits LYMPHATICS: No lymphadenopathy CARDIAC: Irregular rhythm variable first heart sound controlled rate R RESPIRATORY:  Clear to auscultation without rales, wheezing or rhonchi  ABDOMEN: Soft, non-tender, non-distended MUSCULOSKELETAL:  No edema; No deformity  SKIN: Warm and dry NEUROLOGIC:  Alert and oriented x 3 PSYCHIATRIC:  Normal affect    Signed, Miranda Leiter, MD  01/26/2024 2:26 PM    [facility]

## 2024-01-26 ENCOUNTER — Encounter: Payer: Self-pay | Admitting: Cardiology

## 2024-01-26 ENCOUNTER — Ambulatory Visit: Attending: Cardiology | Admitting: Cardiology

## 2024-01-26 VITALS — BP 120/80 | HR 88 | Ht 65.5 in | Wt 249.0 lb

## 2024-01-26 DIAGNOSIS — I4811 Longstanding persistent atrial fibrillation: Secondary | ICD-10-CM | POA: Insufficient documentation

## 2024-01-26 DIAGNOSIS — I110 Hypertensive heart disease with heart failure: Secondary | ICD-10-CM | POA: Diagnosis not present

## 2024-01-26 DIAGNOSIS — I25118 Atherosclerotic heart disease of native coronary artery with other forms of angina pectoris: Secondary | ICD-10-CM | POA: Diagnosis not present

## 2024-01-26 DIAGNOSIS — E782 Mixed hyperlipidemia: Secondary | ICD-10-CM | POA: Insufficient documentation

## 2024-01-26 DIAGNOSIS — Z7901 Long term (current) use of anticoagulants: Secondary | ICD-10-CM | POA: Insufficient documentation

## 2024-01-26 NOTE — Patient Instructions (Signed)

## 2024-01-28 NOTE — Progress Notes (Signed)
   01/28/2024 Name: Miranda Moses MRN: [id number] DOB: 06/07/1941   Received OOP spending report from patient. Sent document to [facility] to be faxed to [facility] ([employer] )   Lang Sieve, PharmD, BCGP Clinical Pharmacist  [phone]

## 2024-03-22 ENCOUNTER — Other Ambulatory Visit: Payer: Self-pay | Admitting: Physician Assistant

## 2024-04-02 ENCOUNTER — Ambulatory Visit: Admitting: Physician Assistant

## 2024-04-02 ENCOUNTER — Encounter: Payer: Self-pay | Admitting: Physician Assistant

## 2024-04-02 VITALS — BP 124/78 | HR 74 | Temp 97.8°F | Resp 18 | Ht 65.5 in | Wt 249.0 lb

## 2024-04-02 DIAGNOSIS — I4891 Unspecified atrial fibrillation: Secondary | ICD-10-CM | POA: Diagnosis not present

## 2024-04-02 DIAGNOSIS — Z23 Encounter for immunization: Secondary | ICD-10-CM | POA: Diagnosis not present

## 2024-04-02 DIAGNOSIS — I110 Hypertensive heart disease with heart failure: Secondary | ICD-10-CM | POA: Diagnosis not present

## 2024-04-02 DIAGNOSIS — E559 Vitamin D deficiency, unspecified: Secondary | ICD-10-CM

## 2024-04-02 DIAGNOSIS — L309 Dermatitis, unspecified: Secondary | ICD-10-CM

## 2024-04-02 DIAGNOSIS — E119 Type 2 diabetes mellitus without complications: Secondary | ICD-10-CM | POA: Diagnosis not present

## 2024-04-02 DIAGNOSIS — E1169 Type 2 diabetes mellitus with other specified complication: Secondary | ICD-10-CM

## 2024-04-02 DIAGNOSIS — E782 Mixed hyperlipidemia: Secondary | ICD-10-CM

## 2024-04-02 DIAGNOSIS — K219 Gastro-esophageal reflux disease without esophagitis: Secondary | ICD-10-CM | POA: Diagnosis not present

## 2024-04-02 DIAGNOSIS — I5032 Chronic diastolic (congestive) heart failure: Secondary | ICD-10-CM | POA: Diagnosis not present

## 2024-04-02 DIAGNOSIS — I4811 Longstanding persistent atrial fibrillation: Secondary | ICD-10-CM

## 2024-04-02 LAB — POCT URINALYSIS DIP (CLINITEK)
Bilirubin, UA: NEGATIVE
Blood, UA: NEGATIVE
Glucose, UA: NEGATIVE mg/dL
Ketones, POC UA: NEGATIVE mg/dL
Leukocytes, UA: NEGATIVE
Nitrite, UA: NEGATIVE
POC PROTEIN,UA: NEGATIVE
Spec Grav, UA: 1.015 (ref 1.010–1.025)
Urobilinogen, UA: 0.2 U/dL
pH, UA: 6 (ref 5.0–8.0)

## 2024-04-02 MED ORDER — AZITHROMYCIN 250 MG PO TABS: ORAL_TABLET | ORAL | 0 refills | Status: AC

## 2024-04-02 NOTE — Progress Notes (Signed)
 Established Patient Office Visit  Subjective:  Patient ID: Miranda Moses, female    DOB: 07/09/1940  Age: 83 y.o. MRN: [id number]  CC:  Chief Complaint  Patient presents with   Medical Management of Chronic Issues    HPI Deloras Reichard presents for chronic follow up hyperlipidemia  Mixed hyperlipidemia  Pt presents with hyperlipidemia.  Compliance with treatment has been good; The patient is compliant with medications, maintains a low cholesterol diet , follows up as directed ,  . The patient denies experiencing any hypercholesterolemia related symptoms. . Pt currently on atorvastatin  20mg  qd  Pt presents for follow up of hypertension.  The patient is tolerating the medication well without side effects. Compliance with treatment has been good; including taking medication as directed , maintains a healthy diet and regular exercise regimen , and following up as directed.  Pt currently on metoprolol  25mg  bid and also takes toresemide 20mg   Pt has a history of chronic diastolic CHF and CAD- she is trying to do a low sodium diet and currently on torsemide  20mg  and potassium supplements -  She is also on imdur  30mg  qd Pt with history of afib - is currently following with cardiology- she is currently on Eliquis  5mg  bid (also has history of PE in 07/2019) she follows with Dr Monetta and unsure when she has next follow up Denies chest pain/sob/edema  Pt with history of GERD - doing well on omeprazole  20mg  with no breakthrough symptoms  Pt with history of diabetes - she has never been on medication and recent hgb a1cs have been under 7.0 - she denies any problems Is due for labwork Pt does not check glucose regularly She does follow with eye specialist and up to date on eye exam  Pt currently on daily vit D3 supplement - due for labwork  Pt would like flu shot today Past Medical History:  Diagnosis Date   Angina pectoris 02/18/2018   CAD in native artery    by CT coronary 10/2018 >> medical  therapy    CHF (congestive heart failure), NYHA class I, chronic, diastolic (HCC) 08/09/2019   Chronic diastolic (congestive) heart failure (HCC)    Coronary artery disease of native artery of native heart with stable angina pectoris 08/09/2019   COVID-19 virus infection    Encounter for vaccination 10/03/2020   Family history of coronary artery disease 01/30/2018   Gastro-esophageal reflux disease without esophagitis 01/30/2018   GERD (gastroesophageal reflux disease)    Health maintenance examination 10/03/2020   History of pulmonary embolism 08/23/2019   Hyperlipidemia    Hypertension    Hypertensive heart disease with heart failure (HCC) 01/30/2018   Mixed hyperlipidemia 02/18/2018   New onset atrial fibrillation (HCC)    Other fatigue 02/10/2020   Other specified menopausal and perimenopausal disorders 02/10/2020   PAD (peripheral artery disease) 08/28/2018   PAH (pulmonary artery hypertension) (HCC) 08/28/2018   Pre-diabetes    Prediabetes 02/10/2020   Pulmonary embolus (HCC)    Pulmonary hypertension (HCC)    Vitamin D  deficiency 08/09/2019    Past Surgical History:  Procedure Laterality Date   ABDOMINAL HYSTERECTOMY     CATARACT EXTRACTION Left 03/2014   CATARACT EXTRACTION Right 05/2014   COLONOSCOPY  11/05/2013    Family History  Problem Relation Age of Onset   Heart disease Father     Social History   Socioeconomic History   Marital status: Widowed    Spouse name: Not on file   Number of children: 1  Years of education: 36   Highest education level: 12th grade  Occupational History   Occupation: retired  Tobacco Use   Smoking status: Never   Smokeless tobacco: Never  Vaping Use   Vaping status: Never Used  Substance and Sexual Activity   Alcohol use: Never   Drug use: Never   Sexual activity: Not Currently    Birth control/protection: Abstinence  Other Topics Concern   Not on file  Social History Narrative   Not on file   Social Drivers of Health   Financial  Resource Strain: Low Risk  (01/13/2024)   Overall Financial Resource Strain (CARDIA)    Difficulty of Paying Living Expenses: Not very hard  Food Insecurity: No Food Insecurity (01/13/2024)   Hunger Vital Sign    Worried About Running Out of Food in the Last Year: Never true    Ran Out of Food in the Last Year: Never true  Transportation Needs: No Transportation Needs (01/13/2024)   PRAPARE - Administrator, Civil Service (Medical): No    Lack of Transportation (Non-Medical): No  Physical Activity: Insufficiently Active (01/13/2024)   Exercise Vital Sign    Days of Exercise per Week: 3 days    Minutes of Exercise per Session: 20 min  Stress: No Stress Concern Present (01/13/2024)   [employer] of Occupational Health - Occupational Stress Questionnaire    Feeling of Stress: Not at all  Social Connections: Moderately Integrated (01/13/2024)   Social Connection and Isolation Panel    Frequency of Communication with Friends and Family: More than three times a week    Frequency of Social Gatherings with Friends and Family: More than three times a week    Attends Religious Services: More than 4 times per year    Active Member of [employer] or Organizations: Yes    Attends Banker Meetings: More than 4 times per year    Marital Status: Widowed  Intimate Partner Violence: Not At Risk (01/13/2024)   Humiliation, Afraid, Rape, and Kick questionnaire    Fear of Current or Ex-Partner: No    Emotionally Abused: No    Physically Abused: No    Sexually Abused: No     Current Outpatient Medications:    Aflibercept  (EYLEA ) 2 MG/0.05ML SOLN, 2 mg by Intravitreal route as needed. Every nine weeks, Disp: , Rfl:    apixaban  (ELIQUIS ) 5 MG TABS tablet, Take 1 tablet (5 mg total) by mouth 2 (two) times daily., Disp: 60 tablet, Rfl: 2   Ascorbic Acid (VITAMIN C) 100 MG tablet, Take 100 mg by mouth daily., Disp: , Rfl:    atorvastatin  (LIPITOR) 20 MG tablet, TAKE 1 TABLET BY MOUTH  EVERY DAY, Disp: 90 tablet, Rfl: 0   azithromycin (ZITHROMAX) 250 MG tablet, Take 2 tablets on day 1, then 1 tablet daily on days 2 through 5, Disp: 6 tablet, Rfl: 0   calcium  carbonate (OSCAL) 1500 (600 Ca) MG TABS tablet, Take 600 mg of elemental calcium  by mouth daily., Disp: , Rfl:    Cholecalciferol (VITAMIN D3 PO), Take 2 capsules by mouth daily., Disp: , Rfl:    isosorbide  mononitrate (IMDUR ) 30 MG 24 hr tablet, TAKE 1 TABLET BY MOUTH EVERY DAY, Disp: 90 tablet, Rfl: 3   loratadine  (CLARITIN ) 10 MG tablet, Take 1 tablet (10 mg total) by mouth daily as needed for allergies., Disp: 90 tablet, Rfl: 1   metoprolol  tartrate (LOPRESSOR ) 25 MG tablet, TAKE 1 TABLET(25 MG) BY MOUTH TWICE DAILY, Disp:  180 tablet, Rfl: 3   Multiple Vitamins-Minerals (OCUVITE PRESERVISION PO), Take 1 tablet by mouth in the morning and at bedtime., Disp: , Rfl:    nitroGLYCERIN  (NITROSTAT ) 0.4 MG SL tablet, Place 1 tablet (0.4 mg total) under the tongue every 5 (five) minutes as needed for chest pain., Disp: 90 tablet, Rfl: 3   omeprazole  (PRILOSEC) 20 MG capsule, TAKE 1 CAPSULE BY MOUTH EVERY DAY, Disp: 90 capsule, Rfl: 1   potassium chloride  SA (KLOR-CON  M20) 20 MEQ tablet, TAKE 1 TABLET BY MOUTH EVERY DAY, Disp: 90 tablet, Rfl: 0   tobramycin (TOBREX) 0.3 % ophthalmic solution, PLEASE SEE ATTACHED FOR DETAILED DIRECTIONS, Disp: , Rfl:    torsemide  (DEMADEX ) 20 MG tablet, Take 1 tablet (20 mg total) by mouth daily., Disp: 90 tablet, Rfl: 0   triamcinolone  (NASACORT ) 55 MCG/ACT AERO nasal inhaler, Place 1 spray into the nose daily as needed for allergies (allergies)., Disp: , Rfl:    vitamin E 200 UNIT capsule, Take 200 Units by mouth daily., Disp: , Rfl:    Allergies  Allergen Reactions   Clarithromycin     Numbness of extremities   Codeine Other (See Comments)    Caused pain   Penicillins Rash and Other (See Comments)    Did it involve swelling of the face/tongue/throat, SOB, or low BP? No  Did it involve  sudden or severe rash/hives, skin peeling, or any reaction on the inside of your mouth or nose? Yes  Did you need to seek medical attention at a hospital or doctor's office? Yes  When did it last happen?      20 years  If all above answers are "NO", may proceed with cephalosporin use.   CONSTITUTIONAL: Negative for chills, fatigue, fever, unintentional weight gain and unintentional weight loss.  E/N/T: Negative for ear pain, nasal congestion and sore throat.  CARDIOVASCULAR: Negative for chest pain, dizziness, palpitations and pedal edema.  RESPIRATORY: Negative for recent cough and dyspnea.  GASTROINTESTINAL: Negative for abdominal pain, acid reflux symptoms, constipation, diarrhea, nausea and vomiting.  GU - has had some urinary frequency recently MSK: Negative for arthralgias and myalgias.  INTEGUMENTARY: pt with small blister on lower leg  NEUROLOGICAL: Negative for dizziness and headaches.  PSYCHIATRIC: Negative for sleep disturbance and to question depression screen.  Negative for depression, negative for anhedonia.          Objective:  PHYSICAL EXAM:   VS: BP 124/78   Pulse 74   Temp 97.8 F (36.6 C) (Temporal)   Resp 18   Ht 5' 5.5 (1.664 m)   Wt 249 lb (112.9 kg)   LMP  (LMP Unknown)   SpO2 99%   BMI 40.81 kg/m   GEN: Well nourished, well developed, in no acute distress  Cardiac: RRR; no murmurs, rubs, or gallops, has 1+ edema with mild stasis dermatitis - one small inflamed, red blister on right lower leg Respiratory:  normal respiratory rate and pattern with no distress - normal breath sounds with no rales, rhonchi, wheezes or rubs MS: no deformity or atrophy  Skin: warm and dry, no rash - small blister on right lower leg Neuro:  Alert and Oriented x 3,  - CN II-Xii grossly intact Psych: euthymic mood, appropriate affect and demeanor   Office Visit on 04/02/2024  Component Date Value Ref Range Status   Color, UA 04/02/2024 yellow  yellow Final   Clarity,  UA 04/02/2024 clear  clear Final   Glucose, UA 04/02/2024 negative  negative mg/dL  Final   Bilirubin, UA 04/02/2024 negative  negative Final   Ketones, POC UA 04/02/2024 negative  negative mg/dL Final   Spec Grav, UA 89/89/7974 1.015  1.010 - 1.025 Final   Blood, UA 04/02/2024 negative  negative Final   pH, UA 04/02/2024 6.0  5.0 - 8.0 Final   POC PROTEIN,UA 04/02/2024 negative  negative, trace Final   Urobilinogen, UA 04/02/2024 0.2  0.2 or 1.0 E.U./dL Final   Nitrite, UA 89/89/7974 Negative  Negative Final   Leukocytes, UA 04/02/2024 Negative  Negative Final     Health Maintenance Due  Topic Date Due   Diabetic kidney evaluation - Urine ACR  Never done    There are no preventive care reminders to display for this patient.  Lab Results  Component Value Date   TSH 2.130 12/02/2023   Lab Results  Component Value Date   WBC 4.0 12/02/2023   HGB 13.3 12/02/2023   HCT 40.9 12/02/2023   MCV 89 12/02/2023   PLT 202 12/02/2023   Lab Results  Component Value Date   NA 142 12/02/2023   K 4.0 12/02/2023   CO2 23 12/02/2023   GLUCOSE 110 (H) 12/02/2023   BUN 17 12/02/2023   CREATININE 0.96 12/02/2023   BILITOT 1.0 12/02/2023   ALKPHOS 144 (H) 12/02/2023   AST 15 12/02/2023   ALT 6 12/02/2023   PROT 6.8 12/02/2023   ALBUMIN 4.1 12/02/2023   CALCIUM  9.4 12/02/2023   ANIONGAP 10 07/29/2019   EGFR 59 (L) 12/02/2023   Lab Results  Component Value Date   CHOL 153 12/02/2023   Lab Results  Component Value Date   HDL 47 12/02/2023   Lab Results  Component Value Date   LDLCALC 86 12/02/2023   Lab Results  Component Value Date   TRIG 113 12/02/2023   Lab Results  Component Value Date   CHOLHDL 3.3 12/02/2023   Lab Results  Component Value Date   HGBA1C 6.0 (H) 12/02/2023      Assessment & Plan:   Problem List Items Addressed This Visit       Cardiovascular and Mediastinum   Hypertensive heart disease with heart failure (HCC)   Relevant Orders   CBC with  Differential/Platelet   Comprehensive metabolic panel Continue current meds Follow up with cardiology as directed     Digestive   Gastro-esophageal reflux disease without esophagitis   Relevant Medications   omeprazole  (PRILOSEC) 20 MG capsule Continue meds     Other   Hyperlipidemia associated with diabetes (HCC)   Relevant Orders   Lipid panel Watch diet and continue meds   Vitamin D  deficiency   Relevant Orders   VITAMIN D  25 Hydroxy (Vit-D Deficiency, Fractures)   Type 2 Diabetes without complication without long term use of insulin(HCC)- diet controlled   Relevant Orders   Hemoglobin A1c Continue to watch diet Cmp, cbc Urine microalbumin  Atrial fibrillation Continue current meds Follow up with cardiology as directed  GERD Continue omeprazole    Other Visit Diagnoses   Dermatitis Rx zpack    Need flu shot Fluzone HI dose given    Meds ordered this encounter  Medications   azithromycin (ZITHROMAX) 250 MG tablet    Sig: Take 2 tablets on day 1, then 1 tablet daily on days 2 through 5    Dispense:  6 tablet    Refill:  0    Supervising Provider:   SHERRE CLAPPER [[id number]]     Follow-up: Return in about 6 months (  around 10/01/2024) for chronic fasting follow-up.    SARA R Capers Hagmann, PA-C

## 2024-04-03 LAB — CBC WITH DIFFERENTIAL/PLATELET
Basophils Absolute: 0 x10E3/uL (ref 0.0–0.2)
Basos: 1 %
EOS (ABSOLUTE): 0.1 x10E3/uL (ref 0.0–0.4)
Eos: 4 %
Hematocrit: 38.8 % (ref 34.0–46.6)
Hemoglobin: 12.3 g/dL (ref 11.1–15.9)
Immature Grans (Abs): 0 x10E3/uL (ref 0.0–0.1)
Immature Granulocytes: 0 %
Lymphocytes Absolute: 0.7 x10E3/uL (ref 0.7–3.1)
Lymphs: 18 %
MCH: 28.7 pg (ref 26.6–33.0)
MCHC: 31.7 g/dL (ref 31.5–35.7)
MCV: 91 fL (ref 79–97)
Monocytes Absolute: 0.4 x10E3/uL (ref 0.1–0.9)
Monocytes: 11 %
Neutrophils Absolute: 2.5 x10E3/uL (ref 1.4–7.0)
Neutrophils: 66 %
Platelets: 215 x10E3/uL (ref 150–450)
RBC: 4.28 x10E6/uL (ref 3.77–5.28)
RDW: 13.1 % (ref 11.7–15.4)
WBC: 3.7 x10E3/uL (ref 3.4–10.8)

## 2024-04-03 LAB — MICROALBUMIN / CREATININE URINE RATIO
Creatinine, Urine: 128.8 mg/dL
Microalb/Creat Ratio: 5 mg/g{creat} (ref 0–29)
Microalbumin, Urine: 6.3 ug/mL

## 2024-04-03 LAB — COMPREHENSIVE METABOLIC PANEL WITH GFR
ALT: 9 IU/L (ref 0–32)
AST: 16 IU/L (ref 0–40)
Albumin: 4.1 g/dL (ref 3.7–4.7)
Alkaline Phosphatase: 126 IU/L (ref 48–129)
BUN/Creatinine Ratio: 17 (ref 12–28)
BUN: 16 mg/dL (ref 8–27)
Bilirubin Total: 1 mg/dL (ref 0.0–1.2)
CO2: 25 mmol/L (ref 20–29)
Calcium: 9.3 mg/dL (ref 8.7–10.3)
Chloride: 101 mmol/L (ref 96–106)
Creatinine, Ser: 0.96 mg/dL (ref 0.57–1.00)
Globulin, Total: 2.8 g/dL (ref 1.5–4.5)
Glucose: 101 mg/dL — ABNORMAL HIGH (ref 70–99)
Potassium: 3.7 mmol/L (ref 3.5–5.2)
Sodium: 143 mmol/L (ref 134–144)
Total Protein: 6.9 g/dL (ref 6.0–8.5)
eGFR: 59 mL/min/1.73 — ABNORMAL LOW (ref >59)

## 2024-04-03 LAB — LIPID PANEL
Chol/HDL Ratio: 3 ratio (ref 0.0–4.4)
Cholesterol, Total: 145 mg/dL (ref 100–199)
HDL: 48 mg/dL (ref >39)
LDL Chol Calc (NIH): 78 mg/dL (ref 0–99)
Triglycerides: 106 mg/dL (ref 0–149)
VLDL Cholesterol Cal: 19 mg/dL (ref 5–40)

## 2024-04-03 LAB — HEMOGLOBIN A1C
Est. average glucose Bld gHb Est-mCnc: 126 mg/dL
Hgb A1c MFr Bld: 6 % — ABNORMAL HIGH (ref 4.8–5.6)

## 2024-04-03 LAB — VITAMIN D 25 HYDROXY (VIT D DEFICIENCY, FRACTURES): Vit D, 25-Hydroxy: 49 ng/mL (ref 30.0–100.0)

## 2024-04-05 ENCOUNTER — Ambulatory Visit: Payer: Self-pay | Admitting: Physician Assistant

## 2024-04-28 DIAGNOSIS — H353211 Exudative age-related macular degeneration, right eye, with active choroidal neovascularization: Secondary | ICD-10-CM | POA: Diagnosis not present

## 2024-04-28 DIAGNOSIS — H353123 Nonexudative age-related macular degeneration, left eye, advanced atrophic without subfoveal involvement: Secondary | ICD-10-CM | POA: Diagnosis not present

## 2024-04-28 DIAGNOSIS — H43813 Vitreous degeneration, bilateral: Secondary | ICD-10-CM | POA: Diagnosis not present

## 2024-04-28 DIAGNOSIS — H26493 Other secondary cataract, bilateral: Secondary | ICD-10-CM | POA: Diagnosis not present

## 2024-05-05 DIAGNOSIS — H26493 Other secondary cataract, bilateral: Secondary | ICD-10-CM | POA: Diagnosis not present

## 2024-05-13 ENCOUNTER — Ambulatory Visit (INDEPENDENT_AMBULATORY_CARE_PROVIDER_SITE_OTHER)

## 2024-05-13 DIAGNOSIS — M810 Age-related osteoporosis without current pathological fracture: Secondary | ICD-10-CM

## 2024-05-13 MED ORDER — DENOSUMAB 60 MG/ML ~~LOC~~ SOSY
60.0000 mg | PREFILLED_SYRINGE | Freq: Once | SUBCUTANEOUS | Status: AC
  Administered 2024-05-13: 60 mg via SUBCUTANEOUS

## 2024-05-13 NOTE — Progress Notes (Signed)
   Patient: Miranda Moses  DOB: 1941-03-10  MRN: [id number]    Visit Date: 05/13/2024    [name] presents today for her six month Prolia  injection.  1 x 60 mg Single-Dose Prefilled Syringe was given SQ in the right arm.  Patient tolerated the injection well and has no questions.  The next Prolia  injection will be due in six months.      Laney LITTIE Ferries, CMA

## 2024-06-18 ENCOUNTER — Other Ambulatory Visit: Payer: Self-pay | Admitting: Physician Assistant

## 2024-06-18 ENCOUNTER — Other Ambulatory Visit: Payer: Self-pay | Admitting: Cardiology

## 2024-06-18 DIAGNOSIS — K219 Gastro-esophageal reflux disease without esophagitis: Secondary | ICD-10-CM

## 2024-07-01 ENCOUNTER — Other Ambulatory Visit: Payer: Self-pay | Admitting: Cardiology

## 2024-07-01 ENCOUNTER — Other Ambulatory Visit: Payer: Self-pay | Admitting: Physician Assistant

## 2024-07-01 DIAGNOSIS — J3089 Other allergic rhinitis: Secondary | ICD-10-CM

## 2024-07-17 ENCOUNTER — Other Ambulatory Visit: Payer: Self-pay | Admitting: Physician Assistant

## 2024-10-04 ENCOUNTER — Ambulatory Visit: Admitting: Physician Assistant

## 2025-01-13 ENCOUNTER — Ambulatory Visit
